# Patient Record
Sex: Male | Born: 1955
Health system: Southern US, Community
[De-identification: ages and names within clinical notes are randomized; demographics above are authoritative.]

## PROBLEM LIST (undated history)

## (undated) DIAGNOSIS — K222 Esophageal obstruction: Secondary | ICD-10-CM

## (undated) DIAGNOSIS — A630 Anogenital (venereal) warts: Secondary | ICD-10-CM

## (undated) DIAGNOSIS — K922 Gastrointestinal hemorrhage, unspecified: Secondary | ICD-10-CM

## (undated) DIAGNOSIS — D649 Anemia, unspecified: Secondary | ICD-10-CM

## (undated) DIAGNOSIS — R945 Abnormal results of liver function studies: Secondary | ICD-10-CM

## (undated) DIAGNOSIS — E785 Hyperlipidemia, unspecified: Secondary | ICD-10-CM

## (undated) DIAGNOSIS — I10 Essential (primary) hypertension: Secondary | ICD-10-CM

## (undated) DIAGNOSIS — T7840XA Allergy, unspecified, initial encounter: Secondary | ICD-10-CM

## (undated) DIAGNOSIS — K219 Gastro-esophageal reflux disease without esophagitis: Secondary | ICD-10-CM

## (undated) HISTORY — DX: Anogenital (venereal) warts: A63.0

## (undated) HISTORY — DX: Gastro-esophageal reflux disease without esophagitis: K21.9

## (undated) HISTORY — DX: Gastrointestinal hemorrhage, unspecified: K92.2

## (undated) HISTORY — DX: Anemia, unspecified: D64.9

## (undated) HISTORY — DX: Allergy, unspecified, initial encounter: T78.40XA

## (undated) HISTORY — PX: GANGLION CYST EXCISION: SHX1691

## (undated) HISTORY — DX: Esophageal obstruction: K22.2

## (undated) HISTORY — DX: Essential (primary) hypertension: I10

## (undated) HISTORY — PX: UPPER GASTROINTESTINAL ENDOSCOPY: SHX188

## (undated) HISTORY — DX: Hyperlipidemia, unspecified: E78.5

## (undated) HISTORY — PX: POLYPECTOMY: SHX149

---

## 2001-07-10 ENCOUNTER — Encounter: Payer: Self-pay | Admitting: General Surgery

## 2001-07-17 ENCOUNTER — Ambulatory Visit (HOSPITAL_COMMUNITY): Admission: RE | Admit: 2001-07-17 | Discharge: 2001-07-17 | Payer: Self-pay | Admitting: General Surgery

## 2001-09-18 ENCOUNTER — Ambulatory Visit (HOSPITAL_COMMUNITY): Admission: RE | Admit: 2001-09-18 | Discharge: 2001-09-18 | Payer: Self-pay | Admitting: General Surgery

## 2001-09-18 ENCOUNTER — Encounter: Payer: Self-pay | Admitting: General Surgery

## 2001-10-30 ENCOUNTER — Ambulatory Visit (HOSPITAL_COMMUNITY): Admission: RE | Admit: 2001-10-30 | Discharge: 2001-10-30 | Payer: Self-pay | Admitting: General Surgery

## 2002-03-05 ENCOUNTER — Other Ambulatory Visit: Admission: RE | Admit: 2002-03-05 | Discharge: 2002-03-05 | Payer: Self-pay | Admitting: Family Medicine

## 2010-07-22 DIAGNOSIS — D649 Anemia, unspecified: Secondary | ICD-10-CM

## 2010-07-22 HISTORY — DX: Anemia, unspecified: D64.9

## 2010-07-24 ENCOUNTER — Other Ambulatory Visit: Payer: Self-pay | Admitting: Obstetrics & Gynecology

## 2010-07-24 ENCOUNTER — Observation Stay (HOSPITAL_COMMUNITY)
Admission: EM | Admit: 2010-07-24 | Discharge: 2010-07-26 | DRG: 174 | Disposition: A | Discharge status: Home / Self Care | Payer: BC Managed Care – PPO | Attending: Internal Medicine | Admitting: Internal Medicine

## 2010-07-24 DIAGNOSIS — E119 Type 2 diabetes mellitus without complications: Secondary | ICD-10-CM | POA: Diagnosis present

## 2010-07-24 DIAGNOSIS — F17200 Nicotine dependence, unspecified, uncomplicated: Secondary | ICD-10-CM | POA: Insufficient documentation

## 2010-07-24 DIAGNOSIS — R0602 Shortness of breath: Secondary | ICD-10-CM | POA: Insufficient documentation

## 2010-07-24 DIAGNOSIS — Z7982 Long term (current) use of aspirin: Secondary | ICD-10-CM | POA: Insufficient documentation

## 2010-07-24 DIAGNOSIS — I10 Essential (primary) hypertension: Secondary | ICD-10-CM | POA: Diagnosis present

## 2010-07-24 DIAGNOSIS — K259 Gastric ulcer, unspecified as acute or chronic, without hemorrhage or perforation: Secondary | ICD-10-CM | POA: Diagnosis present

## 2010-07-24 DIAGNOSIS — K920 Hematemesis: Secondary | ICD-10-CM

## 2010-07-24 DIAGNOSIS — D62 Acute posthemorrhagic anemia: Secondary | ICD-10-CM | POA: Diagnosis present

## 2010-07-24 DIAGNOSIS — A498 Other bacterial infections of unspecified site: Secondary | ICD-10-CM | POA: Diagnosis present

## 2010-07-24 DIAGNOSIS — K254 Chronic or unspecified gastric ulcer with hemorrhage: Principal | ICD-10-CM | POA: Diagnosis present

## 2010-07-24 DIAGNOSIS — N390 Urinary tract infection, site not specified: Secondary | ICD-10-CM | POA: Diagnosis present

## 2010-07-24 DIAGNOSIS — R110 Nausea: Secondary | ICD-10-CM | POA: Insufficient documentation

## 2010-07-24 DIAGNOSIS — Z791 Long term (current) use of non-steroidal anti-inflammatories (NSAID): Secondary | ICD-10-CM | POA: Insufficient documentation

## 2010-07-24 DIAGNOSIS — R55 Syncope and collapse: Secondary | ICD-10-CM | POA: Insufficient documentation

## 2010-07-24 DIAGNOSIS — E785 Hyperlipidemia, unspecified: Secondary | ICD-10-CM | POA: Diagnosis present

## 2010-07-24 DIAGNOSIS — M129 Arthropathy, unspecified: Secondary | ICD-10-CM | POA: Insufficient documentation

## 2010-07-24 DIAGNOSIS — R42 Dizziness and giddiness: Secondary | ICD-10-CM | POA: Insufficient documentation

## 2010-07-24 DIAGNOSIS — K250 Acute gastric ulcer with hemorrhage: Secondary | ICD-10-CM

## 2010-07-24 HISTORY — PX: ESOPHAGOGASTRODUODENOSCOPY: SHX1529

## 2010-07-24 LAB — CBC
HCT: 33.2 % — ABNORMAL LOW (ref 39.0–52.0)
HCT: 29.1 % — ABNORMAL LOW (ref 39.0–52.0)
HCT: 26.7 % — ABNORMAL LOW (ref 39.0–52.0)
Hemoglobin: 11 g/dL — ABNORMAL LOW (ref 13.0–17.0)
Hemoglobin: 8.9 g/dL — ABNORMAL LOW (ref 13.0–17.0)
MCH: 29.2 pg (ref 26.0–34.0)
MCH: 28.8 pg (ref 26.0–34.0)
MCHC: 33.1 g/dL (ref 30.0–36.0)
MCHC: 32.6 g/dL (ref 30.0–36.0)
MCV: 88.1 fL (ref 78.0–100.0)
MCV: 88.2 fL (ref 78.0–100.0)
MCV: 88 fL (ref 78.0–100.0)
MCV: 87.3 fL (ref 78.0–100.0)
Platelets: 327 10*3/uL (ref 150–400)
Platelets: 250 10*3/uL (ref 150–400)
RBC: 3.77 MIL/uL — ABNORMAL LOW (ref 4.22–5.81)
RBC: 3.43 MIL/uL — ABNORMAL LOW (ref 4.22–5.81)
RBC: 3.06 MIL/uL — ABNORMAL LOW (ref 4.22–5.81)
RDW: 13.7 % (ref 11.5–15.5)
RDW: 13.8 % (ref 11.5–15.5)
WBC: 18 10*3/uL — ABNORMAL HIGH (ref 4.0–10.5)
WBC: 12.7 10*3/uL — ABNORMAL HIGH (ref 4.0–10.5)
WBC: 13.3 10*3/uL — ABNORMAL HIGH (ref 4.0–10.5)
WBC: 14.1 10*3/uL — ABNORMAL HIGH (ref 4.0–10.5)

## 2010-07-24 LAB — GLUCOSE, CAPILLARY
Glucose-Capillary: 159 mg/dL — ABNORMAL HIGH (ref 70–99)
Glucose-Capillary: 145 mg/dL — ABNORMAL HIGH (ref 70–99)
Glucose-Capillary: 113 mg/dL — ABNORMAL HIGH (ref 70–99)
Glucose-Capillary: 170 mg/dL — ABNORMAL HIGH (ref 70–99)

## 2010-07-24 LAB — COMPREHENSIVE METABOLIC PANEL
ALT: 17 U/L (ref 0–53)
AST: 17 U/L (ref 0–37)
Albumin: 2.9 g/dL — ABNORMAL LOW (ref 3.5–5.2)
Alkaline Phosphatase: 58 U/L (ref 39–117)
BUN: 30 mg/dL — ABNORMAL HIGH (ref 6–23)
CO2: 23 mEq/L (ref 19–32)
Calcium: 8.4 mg/dL (ref 8.4–10.5)
Chloride: 109 mEq/L (ref 96–112)
Creatinine, Ser: 1 mg/dL (ref 0.4–1.5)
GFR calc Af Amer: >60 mL/min (ref >60)
GFR calc non Af Amer: >60 mL/min (ref >60)
Glucose, Bld: 201 mg/dL — ABNORMAL HIGH (ref 70–99)
Potassium: 4.3 mEq/L (ref 3.5–5.1)
Sodium: 138 mEq/L (ref 135–145)
Total Bilirubin: 0.6 mg/dL (ref 0.3–1.2)
Total Protein: 5.9 g/dL — ABNORMAL LOW (ref 6.0–8.3)

## 2010-07-24 LAB — DIFFERENTIAL
Basophils Absolute: 0.2 10*3/uL — ABNORMAL HIGH (ref 0.0–0.1)
Basophils Relative: 1 % (ref 0–1)
Eosinophils Absolute: 0.3 10*3/uL (ref 0.0–0.7)
Eosinophils Relative: 2 % (ref 0–5)
Lymphocytes Relative: 16 % (ref 12–46)
Lymphs Abs: 2.9 10*3/uL (ref 0.7–4.0)
Monocytes Absolute: 1.1 10*3/uL — ABNORMAL HIGH (ref 0.1–1.0)
Monocytes Relative: 6 % (ref 3–12)
Neutro Abs: 13.6 10*3/uL — ABNORMAL HIGH (ref 1.7–7.7)
Neutrophils Relative %: 75 % (ref 43–77)

## 2010-07-24 LAB — URINALYSIS, ROUTINE W REFLEX MICROSCOPIC
Glucose, UA: NEGATIVE mg/dL
Hgb urine dipstick: NEGATIVE
Ketones, ur: NEGATIVE mg/dL
Protein, ur: NEGATIVE mg/dL

## 2010-07-24 LAB — HEMOCCULT GUIAC POC 1CARD (OFFICE): Fecal Occult Bld: POSITIVE

## 2010-07-24 LAB — LIPASE, BLOOD: Lipase: 47 U/L (ref 11–59)

## 2010-07-24 LAB — ABO/RH: ABO/RH(D): O POS

## 2010-07-25 DIAGNOSIS — K250 Acute gastric ulcer with hemorrhage: Secondary | ICD-10-CM

## 2010-07-25 LAB — COMPREHENSIVE METABOLIC PANEL
AST: 14 U/L (ref 0–37)
Alkaline Phosphatase: 32 U/L — ABNORMAL LOW (ref 39–117)
BUN: 26 mg/dL — ABNORMAL HIGH (ref 6–23)
CO2: 26 mEq/L (ref 19–32)
Chloride: 111 mEq/L (ref 96–112)
Creatinine, Ser: 0.8 mg/dL (ref 0.4–1.5)
GFR calc Af Amer: >60 mL/min (ref >60)
GFR calc non Af Amer: >60 mL/min (ref >60)
Total Bilirubin: 0.4 mg/dL (ref 0.3–1.2)

## 2010-07-25 LAB — GLUCOSE, CAPILLARY
Glucose-Capillary: 121 mg/dL — ABNORMAL HIGH (ref 70–99)
Glucose-Capillary: 99 mg/dL (ref 70–99)

## 2010-07-25 LAB — DIFFERENTIAL
Lymphs Abs: 3.4 10*3/uL (ref 0.7–4.0)
Monocytes Relative: 5 % (ref 3–12)
Neutro Abs: 8.3 10*3/uL — ABNORMAL HIGH (ref 1.7–7.7)
Neutrophils Relative %: 66 % (ref 43–77)

## 2010-07-25 LAB — CBC
Hemoglobin: 9.1 g/dL — ABNORMAL LOW (ref 13.0–17.0)
MCH: 29.4 pg (ref 26.0–34.0)
MCV: 88.7 fL (ref 78.0–100.0)
RBC: 3.1 MIL/uL — ABNORMAL LOW (ref 4.22–5.81)

## 2010-07-25 LAB — HEMOGLOBIN AND HEMATOCRIT, BLOOD: HCT: 25.7 % — ABNORMAL LOW (ref 39.0–52.0)

## 2010-07-26 ENCOUNTER — Telehealth: Payer: Self-pay | Admitting: *Deleted

## 2010-07-26 DIAGNOSIS — K250 Acute gastric ulcer with hemorrhage: Secondary | ICD-10-CM

## 2010-07-26 DIAGNOSIS — D649 Anemia, unspecified: Secondary | ICD-10-CM

## 2010-07-26 DIAGNOSIS — K922 Gastrointestinal hemorrhage, unspecified: Secondary | ICD-10-CM

## 2010-07-26 LAB — URINE CULTURE
Colony Count: >=100000
Culture  Setup Time: 201204040117
Special Requests: NEGATIVE

## 2010-07-26 LAB — DIFFERENTIAL
Eosinophils Absolute: 0.2 10*3/uL (ref 0.0–0.7)
Lymphocytes Relative: 26 % (ref 12–46)
Lymphs Abs: 2.6 10*3/uL (ref 0.7–4.0)
Neutro Abs: 6.6 10*3/uL (ref 1.7–7.7)
Neutrophils Relative %: 65 % (ref 43–77)

## 2010-07-26 LAB — CBC
HCT: 25.4 % — ABNORMAL LOW (ref 39.0–52.0)
Hemoglobin: 8.5 g/dL — ABNORMAL LOW (ref 13.0–17.0)
MCV: 87.6 fL (ref 78.0–100.0)
Platelets: 237 10*3/uL (ref 150–400)
RBC: 2.9 MIL/uL — ABNORMAL LOW (ref 4.22–5.81)
WBC: 10.1 10*3/uL (ref 4.0–10.5)

## 2010-07-26 LAB — COMPREHENSIVE METABOLIC PANEL
Albumin: 3.1 g/dL — ABNORMAL LOW (ref 3.5–5.2)
Alkaline Phosphatase: 38 U/L — ABNORMAL LOW (ref 39–117)
BUN: 14 mg/dL (ref 6–23)
Chloride: 107 mEq/L (ref 96–112)
Potassium: 3.5 mEq/L (ref 3.5–5.1)
Total Bilirubin: 0.7 mg/dL (ref 0.3–1.2)

## 2010-07-26 LAB — GLUCOSE, CAPILLARY: Glucose-Capillary: 101 mg/dL — ABNORMAL HIGH (ref 70–99)

## 2010-07-26 NOTE — Discharge Summary (Signed)
Gerald [name], Gerald [name]            ACCOUNT NO.:  [id number]  MEDICAL RECORD NO.:  [id number]           PATIENT TYPE:  I  LOCATION:  1241                         FACILITY:  [facility]  PHYSICIAN:  Gerald Nap, MD  DATE OF BIRTH:  [birth year]  DATE OF ADMISSION:  07/24/2010 DATE OF DISCHARGE:  07/26/2010                        DISCHARGE SUMMARY - REFERRING   PRIMARY CARE PHYSICIAN:  Gerald John T. Gerald Leyden, MD, from Gerald [name].  DISCHARGE DIAGNOSES: 1. Upper GI bleed, status post esophagogastroduodenoscopy with     epinephrine injection and Endoclip placement. Findings are:     a.     Bleeding gastric ulcer.     b.     Gastroesophageal junction ulcer. 2. Urinary tract infection  (asymptomatic). 3. Anemia. 4. Arthritis. 5. Diabetes mellitus. 6. Hypertension. 7. Hyperlipidemia. 8. History of anal condyloma.  HISTORY:  The patient is a 55 year old Caucasian male very pleasant with history of diabetes mellitus, hypertension and hyperlipidemia and arthritis on chronic ibuprofen use was admitted to the hospital on July 24, 2010 by Dr. Therisa [name] with complaint of hematemesis.  The patient was said to have also complained of lightheadedness.  He denied any history of chest pain or shortness of breath.  He denied any epigastric pain.  He denied any systemic symptoms.  No fever, no chills, no rigor.  The patient also was said to have denied any history of hematochezia or melena.  He was however admitted for stabilization.  MEDICATIONS:  Preadmission meds include: 1. Metformin 500 mg p.o. daily. 2. Aspirin 81 mg p.o. daily. 3. Losartan 50 mg p.o. daily. 4. Simvastatin 20 mg p.o. daily. 5. Ibuprofen 600 mg p.o. daily.  ALLERGIES:  No known allergies.  SOCIAL HISTORY:  Positive for tobacco use, occasionally takes alcohol. Denies any history of street drug use.  FAMILY HISTORY:  Gerald [name] to be significant for diabetes mellitus, brother is diabetic.  REVIEW OF SYSTEMS:   Essentially documented in the initial history and physical.  PHYSICAL EXAMINATION:  VITAL SIGNS:  At time the patient was seen by the admitting physician, vital signs, temperature 97.7, blood pressure is 108/75, pulse is 128, respiratory rate 20, saturating 96% on room air. HEENT:  Pupils are reactive to light and extraocular muscles are intact. NECK:  No jugular venous distention.  No carotid bruit.  No lymphadenopathy. CHEST:  Was said to have been clear to auscultation. HEART:  Sounds are 1 and 2, diminished. ABDOMEN:  Soft, nontender.  Liver, spleen and kidney not palpable. Bowel sounds are positive. EXTREMITIES:  No pedal edema. NEUROLOGIC EXAM:  Nonfocal. MUSCULOSKELETAL SYSTEM:  Showed arthritic changes in the knees. NEUROPSYCHIATRIC EVALUATION:  Unremarkable. SKIN:  Showed normal turgor.  LAB DATA:  Initial complete blood count with differential showed WBC of 18.0, hemoglobin 11.0, hematocrit of 33.2, MCV of 80.1 with platelet count of 327, neutrophil 75%, absolute neutrophil count 13.6 elevated. Comprehensive metabolic panel showed sodium of 138, potassium of 4.3, chloride of 109 with a bicarb of 23, glucose is 201, BUN is 30, creatinine is 1.00.  LFT normal.  Coagulation profile showed PT 15.1, INR 1.17 and APTT of 26, lipase 47.  Fecal occult blood  test positive. A repeat complete blood count with no differential showed WBC of 12.7, hemoglobin 9.5, hematocrit of 29.1, MCV of 88.2 with a platelet count of 261, hemoglobin A1c 6.7.  Urinalysis, nitrite positive and large leukocyte esterase.  Urine microscopy wbc 21-50, few bacteria.  A repeat comprehensive metabolic panel done on July 26, 2010 showed sodium of 138, potassium of 3.5, chloride of 170, bicarb of 26, glucose is 99, BUN is 14, creatinine is 0.77.  LFTs showed total bilirubin 0.7, alkaline phosphatase 38, AST 18, ALT 15 and complete blood count with differential showed WBC of 10.1, hemoglobin of 8.5, hematocrit of  25.4, MCV 87.6 with a platelet count of 233.  Urine culture grew Escherichia coli, sensitive to ciprofloxacin, ceftriaxone, cefazolin, ampicillin, gentamicin, levofloxacin, nitrofurantoin, Tobramycin and Bactrim.  HOSPITAL COURSE:  The patient was admitted to step-down unit.  He was started on normal saline with 10 mEq of KCl to go at rate of 150 mL an hour and was also started on IV Protonix continuous infusion 80 mg stat and subsequently 80 mg per hour continuous infusion.  The patient was also given albuterol and Atrovent nebs q. 6 p.r.n. for shortness of breath.  He was placed on Accu-Cheks with regular insulin sliding scale. The patient was also given morphine 2 mg IV q. 4 p.r.n. and Chloraseptic mouth wash.  GI physician was consulted.  The patient was evaluated by Gerald [name].  Gerald [name] and subsequently had upper endoscopy done with epinephrine injection and Endoclip and the findings are described above. Post EGD, the patient was continued on IV Protonix infusion.  He was seen by me for the very first time in this admission on July 25, 2010 and during this encounter, the patient denied any hematochezia or melena.  Examination of the patient was essentially unremarkable. During this encounter, IV Protonix was discontinued and the patient was to continue on p.o. Protonix.  He was, however, kept for 1 additional day in order to prevent ulcer replete.  The patient was reevaluated by me again today which is July 26, 2010, denied any hematemesis, no diarrhea, no hematochezia.  Examination was essentially unremarkable. Vital Signs, blood pressure is 113/76, pulse 72, respiratory rate 24, medically stable.  PLAN:  Plan is for the patient to be discharged home today on activity as tolerated.  He was advised to discontinue aspirin and nonsteroidal anti-inflammatory for now and to use Tylenol for his arthritis.  He is to continue on diabetic ADA diet and he will be followed by his  primary care physician in 1-2 weeks and also by GI Gerald [name], Gerald [name] on Aug 22, 2010 at 3:15 p.m., telephone number is [phone].  Medication to be taken at home will include: 1. Cipro 500 mg 1 p.o. b.i.d. for 7 days for treatment of UTI. 2. Pantoprazole 40 mg p.o. daily with meals. 3. Fish oil over-the-counter 1 capsule p.o. daily. 4. Glucosamine HCl 2 tablets p.o. daily. 5. Losartan 50 mg 1 p.o. daily. 6. Metformin 500 mg 1 p.o. daily. 7. Multivitamin 1 p.o. daily. 8. Simvastatin 20 mg p.o. daily at bedtime.     Gerald Nap, MD     CN/MEDQ  D:  07/26/2010  T:  07/26/2010  Job:  [id number]  Electronically Signed by Gerald [name]  on 07/26/2010 04:18:53 PM

## 2010-07-26 NOTE — Telephone Encounter (Signed)
Mike Gip, PA wants patient to have CBC on 07/30/10 . Orders in EPIC. She will  notify patient

## 2010-07-30 ENCOUNTER — Other Ambulatory Visit (INDEPENDENT_AMBULATORY_CARE_PROVIDER_SITE_OTHER): Payer: BC Managed Care – PPO

## 2010-07-30 DIAGNOSIS — D649 Anemia, unspecified: Secondary | ICD-10-CM

## 2010-07-30 DIAGNOSIS — K922 Gastrointestinal hemorrhage, unspecified: Secondary | ICD-10-CM

## 2010-07-30 LAB — CBC WITH DIFFERENTIAL/PLATELET
Basophils Relative: 0.7 % (ref 0.0–3.0)
Eosinophils Absolute: 0.2 10*3/uL (ref 0.0–0.7)
Hemoglobin: 8.8 g/dL — ABNORMAL LOW (ref 13.0–17.0)
MCHC: 34.7 g/dL (ref 30.0–36.0)
MCV: 89.1 fl (ref 78.0–100.0)
Monocytes Absolute: 0.7 10*3/uL (ref 0.1–1.0)
Neutro Abs: 6.9 10*3/uL (ref 1.4–7.7)
Neutrophils Relative %: 66.2 % (ref 43.0–77.0)
RBC: 2.86 Mil/uL — ABNORMAL LOW (ref 4.22–5.81)

## 2010-08-01 NOTE — H&P (Signed)
NAMEPACEY, ALTIZER            ACCOUNT NO.:  [id number]  MEDICAL RECORD NO.:  [id number]           PATIENT TYPE:  E  LOCATION:  WLED                         FACILITY:  [facility]  PHYSICIAN:  Michiel Cowboy, MDDATE OF BIRTH:  [birth year]  DATE OF ADMISSION:  07/24/2010 DATE OF DISCHARGE:                             HISTORY & PHYSICAL   PRIMARY CARE PROVIDER:  Broadus John T. Pamalee Leyden, M.D., from [facility].  CHIEF COMPLAINT:  Vomiting blood.  HISTORY OF PRESENT ILLNESS:  The patient is a 55 year old gentleman with past history of hypertension, diabetes, hyperlipidemia, and arthritis for which he is taking ibuprofen chronically.  The patient reports today he developed nausea.  When he vomited, it was bloody emesis.  He vomited the amount equal to about 1 inch from the bottom of small trash can. This was earlier today.  Since then, he has not had any repeat vomiting. He has not had any black stools or dark stools.  He had a normal stool yesterday.  He was lightheaded at first but currently feels a little better, no more nausea, no epigastric pain.  SOCIAL HISTORY:  The patient smokes but drinks on occasion.  Does not abuse drugs.  PAST MEDICAL HISTORY:  Significant for: 1. Diabetes. 2. Hypercholesteremia. 3. Hypertension. 4. Chronic arthritis. 5. History of anal condyloma.  FAMILY HISTORY:  Significant for brother with diabetes.  REVIEW OF SYSTEMS:  Negative except for HPI.  No chest pain.  No shortness of breath.  No diarrhea.  The rest of review of systems negative.  ALLERGIES:  None.  MEDICATIONS: 1. Metformin 500 mg daily. 2. Aspirin 81 mg daily. 3. Losartan 50 mg daily. 4. Simvastatin 20 mg daily. 5. Ibuprofen 600 mg daily.  PHYSICAL EXAMINATION:  VITAL SIGNS:  Temperature 97.7, blood pressure 118/75, pulse 128, respirations 20, and satting 96% on room air. GENERAL:  The patient appears to be in no acute distress. HEENT:  Head nontraumatic but pale.   Somewhat dryish mucous membranes but normal skin turgor. LUNGS:  Occasional wheezes throughout.  No crackles. HEART:  Regular rhythm.  No murmurs appreciated. ABDOMEN:  Soft, nontender, and nondistended.  No epigastric tenderness. LOWER EXTREMITIES:  Without clubbing, cyanosis, or edema. NEUROLOGIC:  Grossly intact. RECTAL:  The patient have had a rectal exam done by me.  It showed some brown stool in the vault.  No maroon stool.  No blood in the rectal vault.  Hemoccult is currently pending.  LABORATORY DATA:  White blood cell count 18, hemoglobin 11.  Sodium 138, potassium 4.3, creatinine 1.0, albumin 2.9.  LFTs within normal limits, lipase 47.  INR within normal limits.  ASSESSMENT AND PLAN:  This is a 55 year old gentleman who had developed nausea, vomiting, and hematemesis, likely upper gastrointestinal source bleed. 1. Hematemesis.  Given elevated blood urea nitrogen of 30 and     creatinine 1.0, we will admit for further evaluation.  We will     obtain type and screen.  Watch in step-down.  Frequent complete    blood count.  We will transfuse if hemoglobin starts to fall     rapidly or go below 8.  Would follow blood urea nitrogen.  Obtain     gastrointestinal consult in a.m. or sooner if he decompensates.  We     will give aggressive intravenous fluids and start Protonix drip. 2. Leukocytosis, etiology unclear.  We will watch, perhaps the patient     is somewhat hemoconcentrated versus this is reactive.  Currently,     no evidence of infection. 3. History of hypertension.  Hold his losartan for now. 4. Prophylaxis.  Protonix drip and sequential compression devices.     Michiel Cowboy, MD     AVD/MEDQ  D:  07/24/2010  T:  07/24/2010  Job:  [id number]  cc:   Broadus John T. Pamalee Leyden, MD  Electronically Signed by Therisa Doyne MD on 08/01/2010 08:42:09 PM

## 2010-08-06 ENCOUNTER — Telehealth: Payer: Self-pay | Admitting: *Deleted

## 2010-08-06 NOTE — Telephone Encounter (Signed)
Left message for patient to call me back. 

## 2010-08-06 NOTE — Telephone Encounter (Signed)
Message copied by Jesse Fall on Mon Aug 06, 2010  2:42 PM ------      Message from: [place], [place]      Created: Mon Aug 06, 2010  1:42 PM       Please let micheal know his hgb is stable at 8.8,but low. See how he is feeling

## 2010-08-07 ENCOUNTER — Telehealth: Payer: Self-pay | Admitting: *Deleted

## 2010-08-07 NOTE — Telephone Encounter (Signed)
Spoke with patient's wife and gave her lab results. She states he is feeling fine

## 2010-08-14 ENCOUNTER — Telehealth: Payer: Self-pay | Admitting: Gastroenterology

## 2010-08-14 NOTE — Telephone Encounter (Signed)
Gave pt the appt information for the EGD on May 29th.

## 2010-08-21 HISTORY — PX: ESOPHAGOGASTRODUODENOSCOPY: SHX1529

## 2010-08-22 ENCOUNTER — Ambulatory Visit: Payer: 59 | Admitting: Gastroenterology

## 2010-09-13 ENCOUNTER — Encounter: Payer: Self-pay | Admitting: Gastroenterology

## 2010-09-13 ENCOUNTER — Ambulatory Visit (INDEPENDENT_AMBULATORY_CARE_PROVIDER_SITE_OTHER): Payer: BC Managed Care – PPO | Admitting: Gastroenterology

## 2010-09-13 DIAGNOSIS — K254 Chronic or unspecified gastric ulcer with hemorrhage: Secondary | ICD-10-CM | POA: Insufficient documentation

## 2010-09-13 DIAGNOSIS — Z1211 Encounter for screening for malignant neoplasm of colon: Secondary | ICD-10-CM | POA: Insufficient documentation

## 2010-09-13 MED ORDER — PEG-KCL-NACL-NASULF-NA ASC-C 100 G PO SOLR: 1.0000 | ORAL | Status: DC

## 2010-09-13 NOTE — Patient Instructions (Signed)
You have been scheduled for a Endo and Colon written instructions have been given. Your prescription has been sent to your pharmacy.  Colonoscopy A colonoscopy is an exam to evaluate your entire colon. In this exam, your colon is cleansed. A long fiberoptic tube is inserted through your rectum and into your colon. The fiberoptic scope (endoscope) is a long bundle of enclosed and very flexible fibers. These fibers transmit light to the area examined and send images from that area to your caregiver. Discomfort is usually minimal. You may be given a drug to help you sleep (sedative) during or prior to the procedure. This exam helps to detect lumps (tumors), polyps, inflammation, and areas of bleeding. Your caregiver may also take a small piece of tissue (biopsy) that will be examined under a microscope. BEFORE THE PROCEDURE  A clear liquid diet may be required for 2 days before the exam.   Liquid injections (enemas) or laxatives may be required.   A large amount of electrolyte solution may be given to you to drink over a short period of time. This solution is used to clean out your colon.   Check in at the admissions desk to fill out necessary forms if not preregistered. There will be consent forms to sign prior to the procedure. If accompanied by friends or family, there is a waiting area for them while you are having your procedure.  LET YOUR CAREGIVER KNOW ABOUT:  Allergies to food or medicine.  Medicines taken, including vitamins, herbs, eyedrops, over-the-counter medicines, and creams.   Use of steroids (by mouth or creams).   Previous problems with anesthetics or numbing medicines.   History of bleeding problems or blood clots.  Previous surgery.   Other health problems, including diabetes and kidney problems.   Possibility of pregnancy, if this applies.   AFTER THE PROCEDURE  If you received a sedative and/or pain medicine, you will need to arrange for someone to drive you home.     Occasionally, there is a little blood passed with the first bowel movement. DO NOT be concerned.  HOME CARE INSTRUCTIONS  It is not unusual to pass moderate amounts of gas and experience mild abdominal cramping following the procedure. This is due to air being used to inflate your colon during the exam. Walking or a warm pack on your belly (abdomen) may help.   You may resume all normal meals and activities after sedatives and medicines have worn off.   Only take over-the-counter or prescription medicines for pain, discomfort, or fever as directed by your caregiver. DO NOT use aspirin or blood thinners if a biopsy was taken. Consult your caregiver for medicine usage if biopsies were taken.  FINDING OUT THE RESULTS OF YOUR TEST Not all test results are available during your visit. If your test results are not back during the visit, make an appointment with your caregiver to find out the results. Do not assume everything is normal if you have not heard from your caregiver or the medical facility. It is important for you to follow up on all of your test results. SEEK IMMEDIATE MEDICAL CARE IF:    You pass large blood clots or fill a toilet with blood following the procedure. This may also occur 10 to 14 days following the procedure. This is more likely if a biopsy was taken.   You develop abdominal pain that keeps getting worse and cannot be relieved with medicine.  Document Released: 04/05/2000 Document Re-Released: 07/03/2009 [employer] Patient Information 2011  [employer].Upper GI Endoscopy Upper GI endoscopy means using a flexible scope to look at the esophagus, stomach and upper small bowel. This is done to make a diagnosis in people with heartburn, abdominal pain, or abnormal bleeding. Sometimes an endoscope is needed to remove foreign bodies or food that become stuck in the esophagus; it can also be used to take biopsy samples. For the best results, do not eat or drink for 8 hours before  having your upper endoscopy.  To perform the endoscopy, you will probably be sedated and your throat will be numbed with a special spray. The endoscope is then slowly passed down your throat (this will not interfere with your breathing). An endoscopy exam takes 15-30 minutes to complete and there is no real pain. Patients rarely remember much about the procedure. The results of the test may take several days if a biopsy or other test is taken.  You may have a sore throat after an endoscopy exam. Serious complications are very rare. Stick to liquids and soft foods until your pain is better. You should not drive a car or operate any dangerous equipment for at least 24 hours after being sedated. SEEK IMMEDIATE MEDICAL CARE IF:  You have severe throat pain.   You have shortness of breath.   You have bleeding problems.   You have a fever.   You have difficulty recovering from your sedation.  Document Released: 05/16/2004 Document Re-Released: 07/03/2009 [facility] Patient Information 2011 [place], [place].

## 2010-09-13 NOTE — Assessment & Plan Note (Signed)
Plan followup endoscopy to document ulcer healing. Patient was carefully instructed to avoid NSAIDs.

## 2010-09-13 NOTE — Progress Notes (Signed)
History of Present Illness:  Gerald [name] has returned following hospitalization in early April for an NSAID-related gastric ulcer with bleeding treated with epinephrine and an Endo Clip. Since discharge he's had no further GI problems. He remains on Protonix. He has quit smoking.    Review of Systems: Pertinent positive and negative review of systems were noted in the above HPI section. All other review of systems were otherwise negative.    Current Medications, Allergies, Past Medical History, Past Surgical History, Family History and Social History were reviewed in [employer] electronic medical record  Vital signs were reviewed in today's medical record. Physical Exam: General: Well developed , well nourished, no acute distress

## 2010-09-18 ENCOUNTER — Telehealth: Payer: Self-pay

## 2010-09-18 ENCOUNTER — Ambulatory Visit (HOSPITAL_COMMUNITY)
Admission: RE | Admit: 2010-09-18 | Discharge: 2010-09-18 | Disposition: A | Discharge status: Home / Self Care | Payer: BC Managed Care – PPO | Source: Ambulatory Visit | Attending: Gastroenterology | Admitting: Gastroenterology

## 2010-09-18 ENCOUNTER — Encounter: Payer: BC Managed Care – PPO | Admitting: Gastroenterology

## 2010-09-18 DIAGNOSIS — Z79899 Other long term (current) drug therapy: Secondary | ICD-10-CM | POA: Insufficient documentation

## 2010-09-18 DIAGNOSIS — Z09 Encounter for follow-up examination after completed treatment for conditions other than malignant neoplasm: Secondary | ICD-10-CM

## 2010-09-18 DIAGNOSIS — Z01810 Encounter for preprocedural cardiovascular examination: Secondary | ICD-10-CM | POA: Insufficient documentation

## 2010-09-18 DIAGNOSIS — Z01812 Encounter for preprocedural laboratory examination: Secondary | ICD-10-CM | POA: Insufficient documentation

## 2010-09-18 DIAGNOSIS — I10 Essential (primary) hypertension: Secondary | ICD-10-CM | POA: Insufficient documentation

## 2010-09-18 DIAGNOSIS — D649 Anemia, unspecified: Secondary | ICD-10-CM | POA: Insufficient documentation

## 2010-09-18 DIAGNOSIS — K250 Acute gastric ulcer with hemorrhage: Secondary | ICD-10-CM

## 2010-09-18 DIAGNOSIS — Z0389 Encounter for observation for other suspected diseases and conditions ruled out: Secondary | ICD-10-CM | POA: Insufficient documentation

## 2010-09-18 DIAGNOSIS — E119 Type 2 diabetes mellitus without complications: Secondary | ICD-10-CM | POA: Insufficient documentation

## 2010-09-18 LAB — CBC
MCH: 25.9 pg — ABNORMAL LOW (ref 26.0–34.0)
MCHC: 32.8 g/dL (ref 30.0–36.0)
Platelets: 294 10*3/uL (ref 150–400)

## 2010-09-18 NOTE — Telephone Encounter (Signed)
Appt made for pt to have colon with propofol 10/16/10@11 :30am. Pt aware of appt date and time.

## 2010-09-18 NOTE — Telephone Encounter (Signed)
Pt should have been scheduled for colon with propofol. Appt made for pt to have colon with

## 2010-09-19 ENCOUNTER — Other Ambulatory Visit: Payer: 59 | Admitting: Gastroenterology

## 2010-09-21 ENCOUNTER — Telehealth: Payer: Self-pay | Admitting: *Deleted

## 2010-09-21 MED ORDER — FERROUS SULFATE DRIED 200 (65 FE) MG PO TABS: 200.0000 mg | ORAL_TABLET | ORAL | Status: DC

## 2010-09-21 NOTE — Telephone Encounter (Signed)
Message copied by Marlowe Kays on Fri Sep 21, 2010  1:16 PM ------      Message from: Melvia Heaps MD D      Created: Thu Sep 20, 2010  8:57 AM       Pt should begin fesol 200mg  qd for 1 month

## 2010-10-16 ENCOUNTER — Encounter: Payer: BC Managed Care – PPO | Admitting: Gastroenterology

## 2010-10-18 ENCOUNTER — Other Ambulatory Visit: Payer: BC Managed Care – PPO | Admitting: Gastroenterology

## 2010-10-21 HISTORY — PX: COLONOSCOPY: SHX174

## 2010-11-02 ENCOUNTER — Ambulatory Visit (AMBULATORY_SURGERY_CENTER): Payer: BC Managed Care – PPO | Admitting: Gastroenterology

## 2010-11-02 ENCOUNTER — Encounter: Payer: Self-pay | Admitting: Gastroenterology

## 2010-11-02 VITALS — BP 148/91 | HR 77 | Temp 98.2°F | Resp 18 | Ht 72.0 in | Wt 222.0 lb

## 2010-11-02 DIAGNOSIS — K635 Polyp of colon: Secondary | ICD-10-CM

## 2010-11-02 DIAGNOSIS — D126 Benign neoplasm of colon, unspecified: Secondary | ICD-10-CM

## 2010-11-02 DIAGNOSIS — Z1211 Encounter for screening for malignant neoplasm of colon: Secondary | ICD-10-CM

## 2010-11-02 LAB — GLUCOSE, CAPILLARY: Glucose-Capillary: 112 mg/dL — ABNORMAL HIGH (ref 70–99)

## 2010-11-02 MED ORDER — SODIUM CHLORIDE 0.9 % IV SOLN: 500.0000 mL | INTRAVENOUS | Status: DC

## 2010-11-02 NOTE — Patient Instructions (Signed)
1 polyp  Otherwise normal  See green and blue sheets for additional d/c instructions.

## 2010-11-05 ENCOUNTER — Telehealth: Payer: Self-pay | Admitting: *Deleted

## 2010-11-05 NOTE — Telephone Encounter (Signed)

## 2012-07-20 ENCOUNTER — Telehealth: Payer: Self-pay | Admitting: Family Medicine

## 2012-07-20 MED ORDER — PRAVASTATIN SODIUM 20 MG PO TABS: 20.0000 mg | ORAL_TABLET | Freq: Every day | ORAL | Status: DC

## 2012-07-20 MED ORDER — LOVASTATIN 20 MG PO TABS: 20.0000 mg | ORAL_TABLET | Freq: Every day | ORAL | Status: DC

## 2012-07-20 NOTE — Telephone Encounter (Signed)
D/c simvastatin and start lovastatin 20 qday.

## 2012-07-20 NOTE — Telephone Encounter (Signed)
Ok to change

## 2012-09-21 ENCOUNTER — Other Ambulatory Visit: Payer: Self-pay | Admitting: Family Medicine

## 2012-12-01 ENCOUNTER — Other Ambulatory Visit: Payer: Self-pay | Admitting: Family Medicine

## 2012-12-01 ENCOUNTER — Encounter: Payer: Self-pay | Admitting: Family Medicine

## 2012-12-01 NOTE — Telephone Encounter (Signed)
Medication refill for one time only.  Patient needs to be seen.  Letter sent for patient to call and schedule 

## 2012-12-21 DIAGNOSIS — R7989 Other specified abnormal findings of blood chemistry: Secondary | ICD-10-CM

## 2012-12-21 DIAGNOSIS — R945 Abnormal results of liver function studies: Secondary | ICD-10-CM

## 2012-12-21 HISTORY — DX: Abnormal results of liver function studies: R94.5

## 2012-12-21 HISTORY — DX: Other specified abnormal findings of blood chemistry: R79.89

## 2012-12-28 ENCOUNTER — Other Ambulatory Visit: Payer: Self-pay | Admitting: Family Medicine

## 2013-01-01 ENCOUNTER — Ambulatory Visit (INDEPENDENT_AMBULATORY_CARE_PROVIDER_SITE_OTHER): Payer: Self-pay | Admitting: Family Medicine

## 2013-01-01 ENCOUNTER — Encounter: Payer: Self-pay | Admitting: Family Medicine

## 2013-01-01 VITALS — BP 164/82 | HR 94 | Temp 98.8°F | Resp 18 | Wt 232.0 lb

## 2013-01-01 DIAGNOSIS — I10 Essential (primary) hypertension: Secondary | ICD-10-CM

## 2013-01-01 MED ORDER — HYDROCHLOROTHIAZIDE 25 MG PO TABS: 25.0000 mg | ORAL_TABLET | Freq: Every day | ORAL | Status: DC

## 2013-01-01 MED ORDER — LOSARTAN POTASSIUM 50 MG PO TABS: 50.0000 mg | ORAL_TABLET | Freq: Every day | ORAL | Status: DC

## 2013-01-01 NOTE — Progress Notes (Signed)
Subjective:    Patient ID: Gerald [name], male    DOB: 1956-01-12, 58 y.o.   MRN: [id number]  HPI Patient has a history of a bleeding gastric ulcer 3 years ago.  Ever since that time he has been fine with no complications, no stomach pain, no reflux. He is avoiding all NSAIDs including aspirin. Occasionally he has a history of hyperlipidemia, hypertension, and diabetes mellitus type 2. At present his blood pressures controlled at 140-160 over 80s. We have previously avoided aspirin therapy due to a concern of GI bleed.  The patient denies any chest pain shortness of breath or dyspnea on exertion. He continues to refrain from smoking for 3 years now. He denies any myalgia or right upper quadrant pain on his pravastatin. He is not checking his sugars at all unfortunately. He is currently on metformin. He denies any polyuria polydipsia or blurred vision. Unfortunately he lost his insurance with the recent health care law.  Therefore he has not seen an eye doctor or had any lab work in over a year. Past Medical History  Diagnosis Date  . Upper GI bleed   . Gastric ulcer   . Anemia   . Arthritis   . Diabetes mellitus   . Unspecified essential hypertension   . Hyperlipemia   . Anal condyloma    Current Outpatient Prescriptions on File Prior to Visit  Medication Sig Dispense Refill  . Glucosamine-Chondroitin-MSM (TRIPLE FLEX PO) Take by mouth.        . metFORMIN (GLUCOPHAGE) 1000 MG tablet TAKE 1 TABLET BY MOUTH TWICE A DAY  180 tablet  3  . Multiple Vitamin (MULTIVITAMIN PO) Take by mouth.        . pravastatin (PRAVACHOL) 20 MG tablet Take 1 tablet (20 mg total) by mouth daily.  90 tablet  3   Current Facility-Administered Medications on File Prior to Visit  Medication Dose Route Frequency Provider Last Rate Last Dose  . 0.9 %  sodium chloride infusion  500 mL Intravenous Continuous Louis Meckel, MD       No Known Allergies History   Social History  . Marital Status: Married   Spouse Name: N/A    Number of Children: N/A  . 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 Smoking status: Former Smoker    Quit date: 07/24/2010  . Smokeless tobacco: Never Used  . Alcohol Use: No  . Drug Use: No  . Sexual Activity: Not on file   Other Topics Concern  . Not on file   Social History Narrative  . No narrative on file      Review of Systems  All other systems reviewed and are negative.       Objective:   Physical Exam  Vitals reviewed. Constitutional: He appears well-developed and well-nourished.  Cardiovascular: Normal rate, regular rhythm, normal heart sounds and intact distal pulses.  Exam reveals no gallop and no friction rub.   No murmur heard. Pulmonary/Chest: Effort normal and breath sounds normal. No respiratory distress. He has no wheezes. He has no rales. He exhibits no tenderness.  Abdominal: Soft. Bowel sounds are normal. He exhibits no distension and no mass. There is no tenderness. There is no rebound and no guarding.          Assessment & Plan:  1. HTN (hypertension) Blood pressure is poorly controlled. Add hydrochlorothiazide 25 mg by mouth daily 2 losartan. Recheck blood  pressure in one month. - hydrochlorothiazide (HYDRODIURIL) 25 MG tablet; Take 1 tablet (25 mg total) by mouth daily.  Dispense: 30 tablet; Refill: 11  2. Type II or unspecified type diabetes mellitus without mention of complication, uncontrolled Patient defers a fasting lipid panel for now due to cost. He consents obtaining a CMP and hemoglobin A1c. His goal hemoglobin A1c less than 6.5. Due to his multiple risk factors I did recommend an aspirin 81 mg by mouth daily. I advised the patient to monitor for any signs of GI upset closely. If he develops any heartburn or indigestion no recommend stopping aspirin immediately. However given his elevated blood pressure, and diabetes, and his hyperlipidemia, I feel the  benefits of an aspirin in preventing cardiovascular disease outweigh the risk of recurrent GI bleed. - COMPLETE METABOLIC PANEL WITH GFR - Hemoglobin A1c

## 2013-01-02 LAB — COMPLETE METABOLIC PANEL WITH GFR
Albumin: 4.7 g/dL (ref 3.5–5.2)
BUN: 18 mg/dL (ref 6–23)
CO2: 24 mEq/L (ref 19–32)
GFR, Est African American: >89 mL/min
GFR, Est Non African American: >89 mL/min
Glucose, Bld: 196 mg/dL — ABNORMAL HIGH (ref 70–99)
Sodium: 138 mEq/L (ref 135–145)
Total Bilirubin: 0.4 mg/dL (ref 0.3–1.2)
Total Protein: 7.3 g/dL (ref 6.0–8.3)

## 2013-01-02 LAB — HEMOGLOBIN A1C: Hgb A1c MFr Bld: 8.7 % — ABNORMAL HIGH (ref <5.7)

## 2013-01-04 ENCOUNTER — Telehealth: Payer: Self-pay | Admitting: Family Medicine

## 2013-01-04 NOTE — Telephone Encounter (Signed)
Pt is on Losartan 100 mg not 50 mg.

## 2013-01-05 NOTE — Telephone Encounter (Signed)
Please update med list and recheck BP in one month on HCTZ.

## 2013-01-06 ENCOUNTER — Encounter: Payer: Self-pay | Admitting: Family Medicine

## 2013-01-11 ENCOUNTER — Other Ambulatory Visit: Payer: Self-pay | Admitting: Family Medicine

## 2013-01-11 MED ORDER — GLIPIZIDE ER 10 MG PO TB24: 10.0000 mg | ORAL_TABLET | Freq: Every day | ORAL | Status: DC

## 2013-01-11 NOTE — Telephone Encounter (Signed)
Med sent to pharm 

## 2013-02-25 ENCOUNTER — Other Ambulatory Visit: Payer: Self-pay | Admitting: Family Medicine

## 2013-04-20 ENCOUNTER — Other Ambulatory Visit: Payer: Self-pay | Admitting: Family Medicine

## 2013-04-20 NOTE — Telephone Encounter (Signed)
Medication refilled per protocol. 

## 2013-05-08 ENCOUNTER — Other Ambulatory Visit: Payer: Self-pay | Admitting: Family Medicine

## 2013-07-09 ENCOUNTER — Other Ambulatory Visit: Payer: Self-pay | Admitting: Family Medicine

## 2013-09-02 ENCOUNTER — Other Ambulatory Visit: Payer: Self-pay | Admitting: Family Medicine

## 2013-09-10 ENCOUNTER — Encounter: Payer: Self-pay | Admitting: Family Medicine

## 2013-09-10 ENCOUNTER — Other Ambulatory Visit: Payer: Self-pay | Admitting: Family Medicine

## 2013-09-10 NOTE — Telephone Encounter (Signed)
Medication refill for one time only.  Patient needs to be seen.  Letter sent for patient to call and schedule 

## 2013-09-10 NOTE — Telephone Encounter (Signed)
Refill appropriate and filled per protocol. 

## 2013-09-14 ENCOUNTER — Other Ambulatory Visit: Payer: Self-pay | Admitting: Family Medicine

## 2013-09-14 MED ORDER — LOSARTAN POTASSIUM 100 MG PO TABS: ORAL_TABLET | ORAL | Status: DC

## 2013-09-14 NOTE — Telephone Encounter (Signed)
Rx Refilled  

## 2013-10-12 ENCOUNTER — Other Ambulatory Visit: Payer: Self-pay | Admitting: Family Medicine

## 2013-11-07 ENCOUNTER — Other Ambulatory Visit: Payer: Self-pay | Admitting: Family Medicine

## 2013-11-09 ENCOUNTER — Other Ambulatory Visit: Payer: Self-pay | Admitting: Family Medicine

## 2013-11-09 ENCOUNTER — Encounter: Payer: Self-pay | Admitting: Family Medicine

## 2013-11-09 NOTE — Telephone Encounter (Signed)
Medication refill for one time only.  Patient needs to be seen.  Letter sent for patient to call and schedule 

## 2013-12-03 ENCOUNTER — Encounter: Payer: Self-pay | Admitting: Family Medicine

## 2013-12-03 ENCOUNTER — Ambulatory Visit (INDEPENDENT_AMBULATORY_CARE_PROVIDER_SITE_OTHER): Payer: Self-pay | Admitting: Family Medicine

## 2013-12-03 VITALS — BP 140/80 | HR 84 | Temp 98.1°F | Resp 18 | Ht 72.0 in | Wt 236.0 lb

## 2013-12-03 DIAGNOSIS — E1165 Type 2 diabetes mellitus with hyperglycemia: Principal | ICD-10-CM

## 2013-12-03 DIAGNOSIS — IMO0001 Reserved for inherently not codable concepts without codable children: Secondary | ICD-10-CM

## 2013-12-03 LAB — COMPLETE METABOLIC PANEL WITH GFR
ALBUMIN: 4.5 g/dL (ref 3.5–5.2)
ALT: 52 U/L (ref 0–53)
AST: 56 U/L — AB (ref 0–37)
Alkaline Phosphatase: 45 U/L (ref 39–117)
BUN: 15 mg/dL (ref 6–23)
CO2: 25 meq/L (ref 19–32)
Calcium: 10.1 mg/dL (ref 8.4–10.5)
Chloride: 100 mEq/L (ref 96–112)
Creat: 0.92 mg/dL (ref 0.50–1.35)
GFR, Est African American: >89 mL/min
GLUCOSE: 176 mg/dL — AB (ref 70–99)
POTASSIUM: 4.1 meq/L (ref 3.5–5.3)
Sodium: 137 mEq/L (ref 135–145)
Total Bilirubin: 0.5 mg/dL (ref 0.2–1.2)
Total Protein: 7.7 g/dL (ref 6.0–8.3)

## 2013-12-03 LAB — HEMOGLOBIN A1C
Hgb A1c MFr Bld: 7.7 % — ABNORMAL HIGH (ref <5.7)
MEAN PLASMA GLUCOSE: 174 mg/dL — AB (ref <117)

## 2013-12-03 LAB — MICROALBUMIN, URINE: MICROALB UR: 1.28 mg/dL (ref 0.00–1.89)

## 2013-12-03 NOTE — Progress Notes (Signed)
   Subjective:    Patient ID: Gerald [name], male    DOB: [birth year], 58 y.o.   MRN: [id number]  HPI Patient history of hypertension, hyperlipidemia, and diabetes mellitus type 2. He is overdue for fasting lipid panel, CMP, and hemoglobin A1c. His blood pressure today is borderline at 140/80. He denies any chest pain short of breath or dyspnea on exertion. His blood pressure is typically 120/80. He is not checking his blood sugars. He denies any myalgias or right upper quadrant pain on statins. Past Medical History  Diagnosis Date  . Upper GI bleed   . Gastric ulcer   . Anemia   . Arthritis   . Diabetes mellitus   . Unspecified essential hypertension   . Hyperlipemia   . Anal condyloma    Past Surgical History  Procedure Laterality Date  . Ganglion cyst excision     Current Outpatient Prescriptions on File Prior to Visit  Medication Sig Dispense Refill  . glipiZIDE (GLUCOTROL XL) 10 MG 24 hr tablet TAKE 1 TABLET EVERY DAY  30 tablet  0  . Glucosamine-Chondroitin-MSM (TRIPLE FLEX PO) Take by mouth.        . hydrochlorothiazide (HYDRODIURIL) 25 MG tablet Take 1 tablet (25 mg total) by mouth daily.  30 tablet  11  . losartan (COZAAR) 100 MG tablet TAKE 1 TABLET BY MOUTH DAILY  30 tablet  0  . metFORMIN (GLUCOPHAGE) 1000 MG tablet TAKE 1 TABLET BY MOUTH TWICE A DAY  180 tablet  3  . Multiple Vitamin (MULTIVITAMIN PO) Take by mouth.        . pravastatin (PRAVACHOL) 20 MG tablet TAKE 1 TABLET EVERY DAY  30 tablet  0   Current Facility-Administered Medications on File Prior to Visit  Medication Dose Route Frequency Provider Last Rate Last Dose  . 0.9 %  sodium chloride infusion  500 mL Intravenous Continuous Inda Castle, MD       No Known Allergies History   Social History  . Marital Status: Married    Spouse Name: N/A    Number of Children: N/A  . 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 Smoking status:  Former Smoker    Quit date: 07/24/2010  . Smokeless tobacco: Never Used  . Alcohol Use: No  . Drug Use: No  . Sexual Activity: Not on file   Other Topics Concern  . Not on file   Social History Narrative  . No narrative on file      Review of Systems  All other systems reviewed and are negative.      Objective:   Physical Exam  Vitals reviewed. Cardiovascular: Normal rate, regular rhythm and normal heart sounds.   Pulmonary/Chest: Effort normal and breath sounds normal. No respiratory distress. He has no wheezes. He has no rales.  Abdominal: Soft. Bowel sounds are normal. He exhibits no distension. There is no tenderness. There is no rebound and no guarding.  Musculoskeletal: He exhibits no edema.          Assessment & Plan:  1. Type II or unspecified type diabetes mellitus without mention of complication, uncontrolled Check a hemoglobin A1c today. Patient's blood pressure is acceptable. I have asked him to return fasting for fasting lipid panel. I also checked a urine microalbumin - COMPLETE METABOLIC PANEL WITH GFR - Hemoglobin A1c - Microalbumin, urine

## 2013-12-08 ENCOUNTER — Other Ambulatory Visit: Payer: Self-pay | Admitting: Family Medicine

## 2013-12-13 ENCOUNTER — Other Ambulatory Visit: Payer: Self-pay | Admitting: Family Medicine

## 2013-12-22 ENCOUNTER — Other Ambulatory Visit: Payer: Self-pay | Admitting: Family Medicine

## 2013-12-23 NOTE — Telephone Encounter (Signed)
Refill appropriate and filled per protocol. 

## 2013-12-31 ENCOUNTER — Telehealth: Payer: Self-pay | Admitting: Family Medicine

## 2013-12-31 NOTE — Telephone Encounter (Signed)
Patient called concerning the RX samples for Invokana. He has seen on the Internet about a Electrical engineer for this drug. He is not sure if he should be taking this medication. He also does not know how he is to be taking the medication.  Please Advise.

## 2014-05-03 ENCOUNTER — Other Ambulatory Visit: Payer: Self-pay | Admitting: Family Medicine

## 2014-05-03 MED ORDER — LOSARTAN POTASSIUM 100 MG PO TABS: ORAL_TABLET | ORAL | Status: DC

## 2014-05-03 NOTE — Telephone Encounter (Signed)
Med sent to pharm 

## 2014-05-31 ENCOUNTER — Other Ambulatory Visit: Payer: Self-pay | Admitting: Family Medicine

## 2014-06-01 ENCOUNTER — Other Ambulatory Visit: Payer: Self-pay | Admitting: Family Medicine

## 2014-06-01 ENCOUNTER — Encounter: Payer: Self-pay | Admitting: Family Medicine

## 2014-06-01 NOTE — Telephone Encounter (Signed)
Medication refill for one time only.  Patient needs to be seen.  Letter sent for patient to call and schedule 

## 2014-06-02 ENCOUNTER — Other Ambulatory Visit: Payer: Self-pay | Admitting: Family Medicine

## 2014-06-02 NOTE — Telephone Encounter (Signed)
Medication filled x1 with no refills.   Requires office visit before any further refills can be given.   Letter sent.  

## 2014-06-17 ENCOUNTER — Ambulatory Visit (INDEPENDENT_AMBULATORY_CARE_PROVIDER_SITE_OTHER): Payer: BLUE CROSS/BLUE SHIELD | Admitting: Family Medicine

## 2014-06-17 ENCOUNTER — Encounter: Payer: Self-pay | Admitting: Family Medicine

## 2014-06-17 VITALS — BP 140/80 | HR 78 | Temp 98.1°F | Resp 18 | Ht 72.0 in | Wt 243.0 lb

## 2014-06-17 DIAGNOSIS — D485 Neoplasm of uncertain behavior of skin: Secondary | ICD-10-CM

## 2014-06-17 DIAGNOSIS — E785 Hyperlipidemia, unspecified: Secondary | ICD-10-CM

## 2014-06-17 DIAGNOSIS — I10 Essential (primary) hypertension: Secondary | ICD-10-CM

## 2014-06-17 DIAGNOSIS — E119 Type 2 diabetes mellitus without complications: Secondary | ICD-10-CM

## 2014-06-17 LAB — COMPLETE METABOLIC PANEL WITH GFR
ALT: 55 U/L — ABNORMAL HIGH (ref 0–53)
AST: 71 U/L — ABNORMAL HIGH (ref 0–37)
Albumin: 4.4 g/dL (ref 3.5–5.2)
Alkaline Phosphatase: 50 U/L (ref 39–117)
BUN: 14 mg/dL (ref 6–23)
CALCIUM: 10 mg/dL (ref 8.4–10.5)
CHLORIDE: 101 meq/L (ref 96–112)
CO2: 22 mEq/L (ref 19–32)
Creat: 0.93 mg/dL (ref 0.50–1.35)
GLUCOSE: 179 mg/dL — AB (ref 70–99)
Potassium: 4.7 mEq/L (ref 3.5–5.3)
SODIUM: 138 meq/L (ref 135–145)
TOTAL PROTEIN: 7.7 g/dL (ref 6.0–8.3)
Total Bilirubin: 0.7 mg/dL (ref 0.2–1.2)

## 2014-06-17 LAB — HEMOGLOBIN A1C
Hgb A1c MFr Bld: 8.8 % — ABNORMAL HIGH (ref <5.7)
Mean Plasma Glucose: 206 mg/dL — ABNORMAL HIGH (ref <117)

## 2014-06-17 LAB — MICROALBUMIN, URINE: Microalb, Ur: 0.4 mg/dL (ref <2.0)

## 2014-06-17 MED ORDER — METFORMIN HCL 1000 MG PO TABS: 1000.0000 mg | ORAL_TABLET | Freq: Two times a day (BID) | ORAL | Status: DC

## 2014-06-17 NOTE — Progress Notes (Signed)
Subjective:    Patient ID: Gerald [name], male    DOB: 03/17/1956, 59 y.o.   MRN: [id number]  HPI Patient is here today for follow-up of his multiple medical problems. He has a history of diabetes mellitus type 2. He is not checking his blood sugar but he denies polyuria, polydipsia, or blurred vision. Diabetic foot exam is performed today and is normal. He is due for Pneumovax 23 as well as diabetic eye exam. Blood pressure is borderline controlled at 140/80. He denies any chest pain shortness of breath or dyspnea on exertion. He denies any myalgias or right upper quadrant pain on his pravastatin that he takes for hyperlipidemia. He does have a suspicious small that is growing on his left lower back. Is approximately 5 mm in diameter. The superior left upper quadrant of the mole has a black center that seems to be changing Past Medical History  Diagnosis Date  . Upper GI bleed   . Gastric ulcer   . Anemia   . Arthritis   . Diabetes mellitus   . Unspecified essential hypertension   . Hyperlipemia   . Anal condyloma    Past Surgical History  Procedure Laterality Date  . Ganglion cyst excision     Current Outpatient Prescriptions on File Prior to Visit  Medication Sig Dispense Refill  . glipiZIDE (GLUCOTROL XL) 10 MG 24 hr tablet TAKE 1 TABLET EVERY DAY 30 tablet 5  . Glucosamine-Chondroitin-MSM (TRIPLE FLEX PO) Take by mouth.      . hydrochlorothiazide (HYDRODIURIL) 25 MG tablet TAKE 1 TABLET BY MOUTH EVERY DAY 30 tablet 11  . losartan (COZAAR) 100 MG tablet TAKE 1 TABLET BY MOUTH EVERY DAY 30 tablet 0  . Multiple Vitamin (MULTIVITAMIN PO) Take by mouth.      . pravastatin (PRAVACHOL) 20 MG tablet TAKE 1 TABLET EVERY DAY 30 tablet 0   Current Facility-Administered Medications on File Prior to Visit  Medication Dose Route Frequency Provider Last Rate Last Dose  . 0.9 %  sodium chloride infusion  500 mL Intravenous Continuous Inda Castle, MD       No Known Allergies History    Social History  . Marital Status: Married    Spouse Name: N/A  . Number of Children: N/A  . 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 Smoking status: Former Smoker    Quit date: 07/24/2010  . Smokeless tobacco: Never Used  . Alcohol Use: No  . Drug Use: No  . Sexual Activity: Not on file   Other Topics Concern  . Not on file   Social History Narrative      Review of Systems  All other systems reviewed and are negative.      Objective:   Physical Exam  Constitutional: He appears well-developed and well-nourished.  Neck: Neck supple. No JVD present. No thyromegaly present.  Cardiovascular: Normal rate, regular rhythm, normal heart sounds and intact distal pulses.  Exam reveals no gallop and no friction rub.   No murmur heard. Pulmonary/Chest: Effort normal and breath sounds normal. No respiratory distress. He has no wheezes. He has no rales. He exhibits no tenderness.  Abdominal: Soft. Bowel sounds are normal. He exhibits no distension and no mass. There is no tenderness. There is no rebound and no guarding.  Musculoskeletal: He exhibits no edema.  Lymphadenopathy:    He has no cervical adenopathy.  Vitals reviewed.  Assessment & Plan:  Essential hypertension  Diabetes mellitus type II, controlled - Plan: COMPLETE METABOLIC PANEL WITH GFR, Hemoglobin A1c, Microalbumin, urine, CANCELED: Lipid panel  HLD (hyperlipidemia)  Neoplasm of uncertain behavior of skin - Plan: Pathology  Patient's blood pressures borderline. I recommended diet exercise and weight loss. I will check a hemoglobin A1c as well as a urine microalbumin. Goal hemoglobin A1c is less than 6.5. I will check a fasting lipid panel. Goal LDL cholesterol is less than 100. Patient received Pneumovax 23 today. I recommended a diabetic eye exam. Diabetic foot exam is up-to-date. Using sterile technique, I injected the suspicious small  his left lower back 0.1% lidocaine with epinephrine. I performed a shave biopsy of the lesion and sent to pathology and labeled container. Hemostasis was achieved with Drysol and a Band-Aid. Patient tolerated procedure well with no complications.

## 2014-06-21 LAB — PATHOLOGY

## 2014-06-27 ENCOUNTER — Other Ambulatory Visit: Payer: Self-pay | Admitting: Family Medicine

## 2014-06-27 MED ORDER — HYDROCHLOROTHIAZIDE 25 MG PO TABS: 25.0000 mg | ORAL_TABLET | Freq: Every day | ORAL | Status: DC

## 2014-06-27 MED ORDER — GLIPIZIDE ER 10 MG PO TB24: 10.0000 mg | ORAL_TABLET | Freq: Every day | ORAL | Status: DC

## 2014-06-27 MED ORDER — METFORMIN HCL 1000 MG PO TABS: 1000.0000 mg | ORAL_TABLET | Freq: Two times a day (BID) | ORAL | Status: DC

## 2014-06-27 MED ORDER — PRAVASTATIN SODIUM 20 MG PO TABS: 20.0000 mg | ORAL_TABLET | Freq: Every day | ORAL | Status: DC

## 2014-06-27 MED ORDER — LOSARTAN POTASSIUM 100 MG PO TABS: 100.0000 mg | ORAL_TABLET | Freq: Every day | ORAL | Status: DC

## 2014-06-29 ENCOUNTER — Other Ambulatory Visit: Payer: Self-pay | Admitting: Family Medicine

## 2014-07-01 ENCOUNTER — Telehealth: Payer: Self-pay | Admitting: Family Medicine

## 2014-07-01 MED ORDER — GLIPIZIDE ER 10 MG PO TB24: 10.0000 mg | ORAL_TABLET | Freq: Every day | ORAL | Status: DC

## 2014-07-01 NOTE — Telephone Encounter (Signed)
Med sent to pharm 

## 2014-07-01 NOTE — Telephone Encounter (Signed)
Patient was prescribed glipizide, said that he has a rx card for 1 free year, but they would need prescription for this in order to use it  Please call back at [phone] (H)  [facility]

## 2014-07-05 ENCOUNTER — Telehealth: Payer: Self-pay | Admitting: Family Medicine

## 2014-07-05 NOTE — Telephone Encounter (Signed)
[phone] Pt states that he was given Glyxambi (did not see this on med list) to try and he is out and he is needing another rx of this [facility]

## 2014-07-06 MED ORDER — EMPAGLIFLOZIN-LINAGLIPTIN 25-5 MG PO TABS: 1.0000 | ORAL_TABLET | Freq: Every day | ORAL | Status: DC

## 2014-07-06 NOTE — Telephone Encounter (Signed)
Med sent to pharm 

## 2014-09-16 ENCOUNTER — Ambulatory Visit (INDEPENDENT_AMBULATORY_CARE_PROVIDER_SITE_OTHER): Payer: BLUE CROSS/BLUE SHIELD | Admitting: Family Medicine

## 2014-09-16 ENCOUNTER — Encounter: Payer: Self-pay | Admitting: Family Medicine

## 2014-09-16 VITALS — BP 142/90 | HR 96 | Temp 98.7°F | Resp 18 | Wt 233.0 lb

## 2014-09-16 DIAGNOSIS — E785 Hyperlipidemia, unspecified: Secondary | ICD-10-CM | POA: Diagnosis not present

## 2014-09-16 DIAGNOSIS — E119 Type 2 diabetes mellitus without complications: Secondary | ICD-10-CM

## 2014-09-16 DIAGNOSIS — J301 Allergic rhinitis due to pollen: Secondary | ICD-10-CM

## 2014-09-16 DIAGNOSIS — I10 Essential (primary) hypertension: Secondary | ICD-10-CM | POA: Diagnosis not present

## 2014-09-16 LAB — COMPLETE METABOLIC PANEL WITH GFR
ALT: 39 U/L (ref 0–53)
AST: 44 U/L — ABNORMAL HIGH (ref 0–37)
Albumin: 4.4 g/dL (ref 3.5–5.2)
Alkaline Phosphatase: 45 U/L (ref 39–117)
BUN: 20 mg/dL (ref 6–23)
CO2: 22 meq/L (ref 19–32)
Calcium: 9.7 mg/dL (ref 8.4–10.5)
Chloride: 99 mEq/L (ref 96–112)
Creat: 0.81 mg/dL (ref 0.50–1.35)
GFR, Est Non African American: >89 mL/min
Glucose, Bld: 140 mg/dL — ABNORMAL HIGH (ref 70–99)
Potassium: 4.1 mEq/L (ref 3.5–5.3)
Sodium: 136 mEq/L (ref 135–145)
Total Bilirubin: 0.7 mg/dL (ref 0.2–1.2)
Total Protein: 8 g/dL (ref 6.0–8.3)

## 2014-09-16 LAB — CBC WITH DIFFERENTIAL/PLATELET
BASOS PCT: 2 % — AB (ref 0–1)
Basophils Absolute: 0.2 10*3/uL — ABNORMAL HIGH (ref 0.0–0.1)
EOS PCT: 1 % (ref 0–5)
Eosinophils Absolute: 0.1 10*3/uL (ref 0.0–0.7)
HEMATOCRIT: 46.4 % (ref 39.0–52.0)
Hemoglobin: 15.9 g/dL (ref 13.0–17.0)
LYMPHS ABS: 1.8 10*3/uL (ref 0.7–4.0)
Lymphocytes Relative: 21 % (ref 12–46)
MCH: 29.4 pg (ref 26.0–34.0)
MCHC: 34.3 g/dL (ref 30.0–36.0)
MCV: 85.8 fL (ref 78.0–100.0)
MPV: 9.8 fL (ref 8.6–12.4)
Monocytes Absolute: 0.5 10*3/uL (ref 0.1–1.0)
Monocytes Relative: 6 % (ref 3–12)
Neutro Abs: 6.1 10*3/uL (ref 1.7–7.7)
Neutrophils Relative %: 70 % (ref 43–77)
PLATELETS: 231 10*3/uL (ref 150–400)
RBC: 5.41 MIL/uL (ref 4.22–5.81)
RDW: 14.4 % (ref 11.5–15.5)
WBC: 8.7 10*3/uL (ref 4.0–10.5)

## 2014-09-16 LAB — LIPID PANEL
CHOLESTEROL: 139 mg/dL (ref 0–200)
HDL: 30 mg/dL — ABNORMAL LOW (ref >=40)
LDL CALC: 76 mg/dL (ref 0–99)
Total CHOL/HDL Ratio: 4.6 Ratio
Triglycerides: 163 mg/dL — ABNORMAL HIGH (ref <150)
VLDL: 33 mg/dL (ref 0–40)

## 2014-09-16 LAB — HEMOGLOBIN A1C
HEMOGLOBIN A1C: 6.7 % — AB (ref <5.7)
Mean Plasma Glucose: 146 mg/dL — ABNORMAL HIGH (ref <117)

## 2014-09-16 MED ORDER — LEVOCETIRIZINE DIHYDROCHLORIDE 5 MG PO TABS: 5.0000 mg | ORAL_TABLET | Freq: Every evening | ORAL | Status: DC

## 2014-09-16 MED ORDER — MONTELUKAST SODIUM 10 MG PO TABS: 10.0000 mg | ORAL_TABLET | Freq: Every day | ORAL | Status: DC

## 2014-09-16 MED ORDER — PREDNISONE 20 MG PO TABS: ORAL_TABLET | ORAL | Status: DC

## 2014-09-16 NOTE — Addendum Note (Signed)
Addended by: Shary Decamp B on: 09/16/2014 09:00 AM   Modules accepted: Orders, Medications

## 2014-09-16 NOTE — Progress Notes (Signed)
Subjective:    Patient ID: Gerald [name], male    DOB: 08-22-1955, 59 y.o.   MRN: [id number]  HPI  Patient has lost 10 lbs since his last ov.  He has tried to change his diet considerably.  His BP today is borderline.  He is complaining of severe allergies characterized by rhinorrhea, postnasal drip, sneezing, scratchy throat, and hoarseness. He denies polyuria, polydipsia, or blurred vision. He denies myalgias or right upper quadrant pain. He denies any chest pain shortness of breath or dyspnea on exertion. Past Medical History  Diagnosis Date  . Upper GI bleed   . Gastric ulcer   . Anemia   . Arthritis   . Diabetes mellitus   . Unspecified essential hypertension   . Hyperlipemia   . Anal condyloma    Past Surgical History  Procedure Laterality Date  . Ganglion cyst excision     Current Outpatient Prescriptions on File Prior to Visit  Medication Sig Dispense Refill  . Empagliflozin-Linagliptin (GLYXAMBI) 25-5 MG TABS Take 1 tablet by mouth daily. 30 tablet 5  . glipiZIDE (GLUCOTROL XL) 10 MG 24 hr tablet Take 1 tablet (10 mg total) by mouth daily. 90 tablet 3  . Glucosamine-Chondroitin-MSM (TRIPLE FLEX PO) Take by mouth.      . hydrochlorothiazide (HYDRODIURIL) 25 MG tablet Take 1 tablet (25 mg total) by mouth daily. 90 tablet 3  . losartan (COZAAR) 100 MG tablet Take 1 tablet (100 mg total) by mouth daily. 90 tablet 3  . metFORMIN (GLUCOPHAGE) 1000 MG tablet Take 1 tablet (1,000 mg total) by mouth 2 (two) times daily. 90 tablet 3  . Multiple Vitamin (MULTIVITAMIN PO) Take by mouth.      . pravastatin (PRAVACHOL) 20 MG tablet Take 1 tablet (20 mg total) by mouth daily. 90 tablet 3  . pravastatin (PRAVACHOL) 20 MG tablet TAKE 1 TABLET EVERY DAY 30 tablet 5   Current Facility-Administered Medications on File Prior to Visit  Medication Dose Route Frequency Provider Last Rate Last Dose  . 0.9 %  sodium chloride infusion  500 mL Intravenous Continuous Inda Castle, MD        No Known Allergies History   Social History  . Marital Status: Married    Spouse Name: N/A  . Number of Children: N/A  . 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 Smoking status: Former Smoker    Quit date: 07/24/2010  . Smokeless tobacco: Never Used  . Alcohol Use: No  . Drug Use: No  . Sexual Activity: Not on file   Other Topics Concern  . Not on file   Social History Narrative     Review of Systems  All other systems reviewed and are negative.      Objective:   Physical Exam  Constitutional: He appears well-developed and well-nourished.  Neck: Neck supple.  Cardiovascular: Normal rate, regular rhythm, normal heart sounds and intact distal pulses.   No murmur heard. Pulmonary/Chest: Effort normal and breath sounds normal. No respiratory distress. He has no wheezes. He has no rales.  Abdominal: Soft. Bowel sounds are normal. He exhibits no distension. There is no tenderness. There is no rebound and no guarding.  Musculoskeletal: He exhibits no edema.  Lymphadenopathy:    He has no cervical adenopathy.  Vitals reviewed.         Assessment & Plan:  Essential hypertension  Diabetes mellitus type II, controlled -  Plan: COMPLETE METABOLIC PANEL WITH GFR, CBC with Differential/Platelet, Lipid panel, Hemoglobin A1c, Microalbumin, urine  HLD (hyperlipidemia)  Allergic rhinitis due to pollen - Plan: levocetirizine (XYZAL) 5 MG tablet  Blood pressure is borderline. I've asked the patient check his blood pressure on a daily basis and notify me of the values in one week. If persistently greater than 140/90, I would add amlodipine. I will start the patient on Xyzal 5 mg by mouth daily for allergies. If no better consider prednisone. I will check a hemoglobin A1c. Goal hemoglobin A1c is less than 6.5. If significantly less than 6.5 after the weight loss consider stopping glipizide. I will also check a  fasting lipid panel. Goal LDL cholesterol is less than 100. Diabetic foot exam is up-to-date. Patient is taking an aspirin. Recommended diabetic eye exam.

## 2014-09-17 LAB — MICROALBUMIN, URINE: Microalb, Ur: 0.2 mg/dL (ref <2.0)

## 2014-09-20 ENCOUNTER — Encounter: Payer: Self-pay | Admitting: Family Medicine

## 2014-10-10 ENCOUNTER — Telehealth: Payer: Self-pay | Admitting: Family Medicine

## 2014-10-10 MED ORDER — MONTELUKAST SODIUM 10 MG PO TABS: 10.0000 mg | ORAL_TABLET | Freq: Every day | ORAL | Status: DC

## 2014-10-10 NOTE — Telephone Encounter (Signed)
Medication called/sent to requested pharmacy  

## 2014-10-10 NOTE — Telephone Encounter (Signed)
Patient is calling to need refill on his montelukast but would like 3 month supply mail order, he says he has done mail order before but I did not see a mail order pharmacy in here for him

## 2014-12-16 ENCOUNTER — Encounter: Payer: Self-pay | Admitting: Family Medicine

## 2014-12-16 ENCOUNTER — Ambulatory Visit (INDEPENDENT_AMBULATORY_CARE_PROVIDER_SITE_OTHER): Payer: BLUE CROSS/BLUE SHIELD | Admitting: Family Medicine

## 2014-12-16 VITALS — BP 122/68 | HR 78 | Temp 98.1°F | Resp 16 | Ht 72.0 in | Wt 227.0 lb

## 2014-12-16 DIAGNOSIS — Z23 Encounter for immunization: Secondary | ICD-10-CM

## 2014-12-16 DIAGNOSIS — Z0000 Encounter for general adult medical examination without abnormal findings: Secondary | ICD-10-CM

## 2014-12-16 NOTE — Progress Notes (Signed)
Subjective:    Patient ID: Gerald [name], male    DOB: 1956/02/15, 59 y.o.   MRN: [id number]  HPI Patient is here today for complete physical exam. He was just seen in May for his diabetes check at that time, his hemoglobin A1c was found to be 6.7. His LDL cholesterol was well below 100, his CBC and CMP were normal. He is not due for repeat fasting lab work until November 2016. He is due for a PSA. Patient is due for a flu shot this fall. He is due for a tetanus shot right now. His last colonoscopy was in 2012 and was significant for tubular adenoma. He is due for repeat colonoscopy next year. Otherwise his preventative care is up-to-date. The remainder of his review of systems is negative. Past Medical History  Diagnosis Date  . Upper GI bleed   . Gastric ulcer   . Anemia   . Arthritis   . Diabetes mellitus   . Unspecified essential hypertension   . Hyperlipemia   . Anal condyloma    Past Surgical History  Procedure Laterality Date  . Ganglion cyst excision     Current Outpatient Prescriptions on File Prior to Visit  Medication Sig Dispense Refill  . Empagliflozin-Linagliptin (GLYXAMBI) 25-5 MG TABS Take 1 tablet by mouth daily. 30 tablet 5  . glipiZIDE (GLUCOTROL XL) 10 MG 24 hr tablet Take 1 tablet (10 mg total) by mouth daily. 90 tablet 3  . Glucosamine-Chondroitin-MSM (TRIPLE FLEX PO) Take by mouth.      . hydrochlorothiazide (HYDRODIURIL) 25 MG tablet Take 1 tablet (25 mg total) by mouth daily. 90 tablet 3  . losartan (COZAAR) 100 MG tablet Take 1 tablet (100 mg total) by mouth daily. 90 tablet 3  . metFORMIN (GLUCOPHAGE) 1000 MG tablet Take 1 tablet (1,000 mg total) by mouth 2 (two) times daily. 90 tablet 3  . montelukast (SINGULAIR) 10 MG tablet Take 1 tablet (10 mg total) by mouth at bedtime. 90 tablet 3  . Multiple Vitamin (MULTIVITAMIN PO) Take by mouth.      . pravastatin (PRAVACHOL) 20 MG tablet Take 1 tablet (20 mg total) by mouth daily. 90 tablet 3   Current  Facility-Administered Medications on File Prior to Visit  Medication Dose Route Frequency Provider Last Rate Last Dose  . 0.9 %  sodium chloride infusion  500 mL Intravenous Continuous Inda Castle, MD       No Known Allergies Social History   Social History  . Marital Status: Married    Spouse Name: N/A  . Number of Children: N/A  . 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 Smoking status: Former Smoker    Quit date: 07/24/2010  . Smokeless tobacco: Never Used  . Alcohol Use: No  . Drug Use: No  . Sexual Activity: Not on file   Other Topics Concern  . Not on file   Social History Narrative   Family History  Problem Relation Age of Onset  . Diabetes Brother   . Colon cancer Neg Hx     Review of Systems  All other systems reviewed and are negative.      Objective:   Physical Exam  Constitutional: He is oriented to person, place, and time. He appears well-developed and well-nourished. No distress.  HENT:  Head: Normocephalic and atraumatic.  Right Ear: External ear normal.  Left Ear: External ear normal.  Nose: Nose normal.  Mouth/Throat: Oropharynx is clear and moist. No oropharyngeal exudate.  Eyes: Conjunctivae and EOM are normal. Pupils are equal, round, and reactive to light. Right eye exhibits no discharge. Left eye exhibits no discharge. No scleral icterus.  Neck: Normal range of motion. Neck supple. No JVD present. No thyromegaly present.  Cardiovascular: Normal rate, regular rhythm, normal heart sounds and intact distal pulses.  Exam reveals no gallop and no friction rub.   No murmur heard. Pulmonary/Chest: Effort normal and breath sounds normal. No stridor. No respiratory distress. He has no wheezes. He has no rales. He exhibits no tenderness.  Abdominal: Soft. Bowel sounds are normal. He exhibits no distension. There is no tenderness. There is no rebound and no guarding.  Genitourinary:  Rectum normal, prostate normal and penis normal.  Musculoskeletal: Normal range of motion. He exhibits no edema or tenderness.  Lymphadenopathy:    He has no cervical adenopathy.  Neurological: He is alert and oriented to person, place, and time. He has normal reflexes. He displays normal reflexes. No cranial nerve deficit. He exhibits normal muscle tone. Coordination normal.  Skin: Skin is warm. No rash noted. He is not diaphoretic. No erythema. No pallor.  Psychiatric: He has a normal mood and affect. His behavior is normal. Judgment and thought content normal.  Vitals reviewed.         Assessment & Plan:  Routine general medical examination at a health care facility - Plan: PSA, Tdap vaccine greater than or equal to 7yo IM  Need for Tdap vaccination - Plan: Tdap vaccine greater than or equal to 7yo IM  Patient's physical exam is completely normal. I will check a PSA today. He is given the tetanus shot today. The remainder of his cancer screening is up-to-date. He is not due for repeat fasting lab work until November. General anticipatory guidance is provided.

## 2014-12-17 LAB — PSA: PSA: 0.36 ng/mL (ref <=4.00)

## 2014-12-19 ENCOUNTER — Encounter: Payer: Self-pay | Admitting: Family Medicine

## 2014-12-28 ENCOUNTER — Other Ambulatory Visit: Payer: Self-pay | Admitting: Family Medicine

## 2014-12-31 ENCOUNTER — Other Ambulatory Visit: Payer: Self-pay | Admitting: Family Medicine

## 2015-01-05 ENCOUNTER — Telehealth: Payer: Self-pay | Admitting: Family Medicine

## 2015-01-05 MED ORDER — METFORMIN HCL 1000 MG PO TABS: 1000.0000 mg | ORAL_TABLET | Freq: Two times a day (BID) | ORAL | Status: DC

## 2015-01-05 NOTE — Telephone Encounter (Signed)
Pt's escript for Metformin was written for 90 pills but the pt needs a 3 mo. Supply. He takes 1 pill 2x per day. You can reach him @ [phone]

## 2015-01-05 NOTE — Telephone Encounter (Signed)
Corrected qty and resent rx

## 2015-03-24 ENCOUNTER — Other Ambulatory Visit: Payer: Self-pay | Admitting: Family Medicine

## 2015-03-24 ENCOUNTER — Encounter: Payer: Self-pay | Admitting: Family Medicine

## 2015-03-24 ENCOUNTER — Ambulatory Visit (INDEPENDENT_AMBULATORY_CARE_PROVIDER_SITE_OTHER): Payer: BLUE CROSS/BLUE SHIELD | Admitting: Family Medicine

## 2015-03-24 VITALS — BP 140/80 | HR 78 | Temp 98.8°F | Resp 16 | Ht 72.0 in | Wt 236.0 lb

## 2015-03-24 DIAGNOSIS — E785 Hyperlipidemia, unspecified: Secondary | ICD-10-CM

## 2015-03-24 DIAGNOSIS — I10 Essential (primary) hypertension: Secondary | ICD-10-CM | POA: Diagnosis not present

## 2015-03-24 DIAGNOSIS — Z23 Encounter for immunization: Secondary | ICD-10-CM

## 2015-03-24 DIAGNOSIS — E119 Type 2 diabetes mellitus without complications: Secondary | ICD-10-CM

## 2015-03-24 LAB — LIPID PANEL
CHOLESTEROL: 152 mg/dL (ref 125–200)
HDL: 34 mg/dL — AB (ref >=40)
LDL Cholesterol: 88 mg/dL (ref <130)
TRIGLYCERIDES: 148 mg/dL (ref <150)
Total CHOL/HDL Ratio: 4.5 Ratio (ref <=5.0)
VLDL: 30 mg/dL (ref <30)

## 2015-03-24 LAB — COMPLETE METABOLIC PANEL WITH GFR
ALBUMIN: 4.8 g/dL (ref 3.6–5.1)
ALK PHOS: 51 U/L (ref 40–115)
ALT: 35 U/L (ref 9–46)
AST: 40 U/L — ABNORMAL HIGH (ref 10–35)
BILIRUBIN TOTAL: 0.9 mg/dL (ref 0.2–1.2)
BUN: 17 mg/dL (ref 7–25)
CO2: 26 mmol/L (ref 20–31)
Calcium: 10.2 mg/dL (ref 8.6–10.3)
Chloride: 101 mmol/L (ref 98–110)
Creat: 0.87 mg/dL (ref 0.70–1.33)
Glucose, Bld: 118 mg/dL — ABNORMAL HIGH (ref 70–99)
POTASSIUM: 4.6 mmol/L (ref 3.5–5.3)
Sodium: 139 mmol/L (ref 135–146)
TOTAL PROTEIN: 8.1 g/dL (ref 6.1–8.1)

## 2015-03-24 LAB — HEMOGLOBIN A1C
Hgb A1c MFr Bld: 6.6 % — ABNORMAL HIGH (ref <5.7)
Mean Plasma Glucose: 143 mg/dL — ABNORMAL HIGH (ref <117)

## 2015-03-24 NOTE — Progress Notes (Signed)
Subjective:    Patient ID: Gerald [name], male    DOB: 07-Jun-1955, 59 y.o.   MRN: [id number]  HPI 5/16  Patient has lost 10 lbs since his last ov.  He has tried to change his diet considerably.  His BP today is borderline.  He is complaining of severe allergies characterized by rhinorrhea, postnasal drip, sneezing, scratchy throat, and hoarseness. He denies polyuria, polydipsia, or blurred vision. He denies myalgias or right upper quadrant pain. He denies any chest pain shortness of breath or dyspnea on exertion.  At that time, my plan was: Blood pressure is borderline. I've asked the patient check his blood pressure on a daily basis and notify me of the values in one week. If persistently greater than 140/90, I would add amlodipine. I will start the patient on Xyzal 5 mg by mouth daily for allergies. If no better consider prednisone. I will check a hemoglobin A1c. Goal hemoglobin A1c is less than 6.5. If significantly less than 6.5 after the weight loss consider stopping glipizide. I will also check a fasting lipid panel. Goal LDL cholesterol is less than 100. Diabetic foot exam is up-to-date. Patient is taking an aspirin. Recommended diabetic eye exam.  03/24/15 Patient states that his blood pressure at home is typically 130-135/80. He denies any chest pain shortness of breath or dyspnea on exertion. He has not been checking his blood sugar often but when he does his fasting blood sugars are less than 130. He is not checking any postprandial sugars. He denies any hypoglycemia. He denies any neuropathy, numbness in the feet, pain in the feet. He denies any myalgias or right upper quadrant pain on his statin medication. Diabetic foot exam is performed today and is up-to-date. He is also taking an aspirin. He states that he had a diabetic eye exam earlier this year. Past Medical History  Diagnosis Date  . Upper GI bleed   . Gastric ulcer   . Anemia   . Arthritis   . Diabetes mellitus   .  Unspecified essential hypertension   . Hyperlipemia   . Anal condyloma    Past Surgical History  Procedure Laterality Date  . Ganglion cyst excision     Current Outpatient Prescriptions on File Prior to Visit  Medication Sig Dispense Refill  . glipiZIDE (GLUCOTROL XL) 10 MG 24 hr tablet Take 1 tablet (10 mg total) by mouth daily. 90 tablet 3  . Glucosamine-Chondroitin-MSM (TRIPLE FLEX PO) Take by mouth.      Marland Kitchen GLYXAMBI 25-5 MG TABS TAKE 1 TABLET BY MOUTH DAILY. 30 tablet 5  . hydrochlorothiazide (HYDRODIURIL) 25 MG tablet Take 1 tablet (25 mg total) by mouth daily. 90 tablet 3  . losartan (COZAAR) 100 MG tablet Take 1 tablet (100 mg total) by mouth daily. 90 tablet 3  . metFORMIN (GLUCOPHAGE) 1000 MG tablet Take 1 tablet (1,000 mg total) by mouth 2 (two) times daily. 180 tablet 2  . montelukast (SINGULAIR) 10 MG tablet Take 1 tablet (10 mg total) by mouth at bedtime. 90 tablet 3  . Multiple Vitamin (MULTIVITAMIN PO) Take by mouth.      . pravastatin (PRAVACHOL) 20 MG tablet Take 1 tablet (20 mg total) by mouth daily. 90 tablet 3   Current Facility-Administered Medications on File Prior to Visit  Medication Dose Route Frequency Provider Last Rate Last Dose  . 0.9 %  sodium chloride infusion  500 mL Intravenous Continuous Inda Castle, MD       No  Known Allergies Social History   Social History  . Marital Status: Married    Spouse Name: N/A  . Number of Children: N/A  . 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 Smoking status: Former Smoker    Quit date: 07/24/2010  . Smokeless tobacco: Never Used  . Alcohol Use: No  . Drug Use: No  . Sexual Activity: Not on file   Other Topics Concern  . Not on file   Social History Narrative     Review of Systems  All other systems reviewed and are negative.      Objective:   Physical Exam  Constitutional: He appears well-developed and well-nourished.  Neck: Neck  supple.  Cardiovascular: Normal rate, regular rhythm, normal heart sounds and intact distal pulses.   No murmur heard. Pulmonary/Chest: Effort normal and breath sounds normal. No respiratory distress. He has no wheezes. He has no rales.  Abdominal: Soft. Bowel sounds are normal. He exhibits no distension. There is no tenderness. There is no rebound and no guarding.  Musculoskeletal: He exhibits no edema.  Lymphadenopathy:    He has no cervical adenopathy.  Vitals reviewed.         Assessment & Plan:  Controlled type 2 diabetes mellitus without complication, without long-term current use of insulin ([place]) - Plan: COMPLETE METABOLIC PANEL WITH GFR, Lipid panel, Hemoglobin A1c, Microalbumin, urine  Essential hypertension  HLD (hyperlipidemia)  Patient is currently taking an aspirin. His diabetic eye exam is reportedly up-to-date. Diabetic foot exam is normal. He received Pneumovax 23 today in clinic. His blood pressure is acceptable. I will check a fasting lipid panel. Goal LDL cholesterol is less than 100. I will also check a hemoglobin A1c. Goal hemoglobin A1c is less than 6.5.

## 2015-03-24 NOTE — Addendum Note (Signed)
Addended by: Shary Decamp B on: 03/24/2015 09:50 AM   Modules accepted: Orders

## 2015-03-25 LAB — MICROALBUMIN, URINE: Microalb, Ur: 0.8 mg/dL

## 2015-03-28 LAB — HEPATITIS PANEL, ACUTE
HCV AB: NEGATIVE
HEP B C IGM: NONREACTIVE
Hep A IgM: NONREACTIVE
Hepatitis B Surface Ag: NEGATIVE

## 2015-04-05 ENCOUNTER — Encounter: Payer: Self-pay | Admitting: Family Medicine

## 2015-05-10 ENCOUNTER — Other Ambulatory Visit: Payer: Self-pay | Admitting: Family Medicine

## 2015-05-10 NOTE — Telephone Encounter (Signed)
Refill appropriate and filled per protocol. 

## 2015-06-17 ENCOUNTER — Other Ambulatory Visit: Payer: Self-pay | Admitting: Family Medicine

## 2015-06-23 ENCOUNTER — Encounter: Payer: Self-pay | Admitting: Family Medicine

## 2015-06-23 ENCOUNTER — Ambulatory Visit (INDEPENDENT_AMBULATORY_CARE_PROVIDER_SITE_OTHER): Payer: BLUE CROSS/BLUE SHIELD | Admitting: Family Medicine

## 2015-06-23 VITALS — BP 132/80 | HR 78 | Temp 98.3°F | Resp 18 | Ht 72.0 in | Wt 240.0 lb

## 2015-06-23 DIAGNOSIS — I10 Essential (primary) hypertension: Secondary | ICD-10-CM

## 2015-06-23 DIAGNOSIS — E785 Hyperlipidemia, unspecified: Secondary | ICD-10-CM | POA: Diagnosis not present

## 2015-06-23 DIAGNOSIS — E119 Type 2 diabetes mellitus without complications: Secondary | ICD-10-CM

## 2015-06-23 LAB — COMPLETE METABOLIC PANEL WITH GFR
ALBUMIN: 4.5 g/dL (ref 3.6–5.1)
ALK PHOS: 50 U/L (ref 40–115)
ALT: 36 U/L (ref 9–46)
AST: 43 U/L — AB (ref 10–35)
BUN: 18 mg/dL (ref 7–25)
CALCIUM: 9.8 mg/dL (ref 8.6–10.3)
CO2: 24 mmol/L (ref 20–31)
CREATININE: 0.94 mg/dL (ref 0.70–1.33)
Chloride: 99 mmol/L (ref 98–110)
GFR, Est Non African American: 88 mL/min (ref >=60)
Glucose, Bld: 126 mg/dL — ABNORMAL HIGH (ref 70–99)
Potassium: 3.9 mmol/L (ref 3.5–5.3)
Sodium: 139 mmol/L (ref 135–146)
Total Bilirubin: 0.7 mg/dL (ref 0.2–1.2)
Total Protein: 7.6 g/dL (ref 6.1–8.1)

## 2015-06-23 LAB — LIPID PANEL
Cholesterol: 130 mg/dL (ref 125–200)
HDL: 31 mg/dL — ABNORMAL LOW (ref >=40)
LDL CALC: 64 mg/dL (ref <130)
Total CHOL/HDL Ratio: 4.2 Ratio (ref <=5.0)
Triglycerides: 174 mg/dL — ABNORMAL HIGH (ref <150)
VLDL: 35 mg/dL — ABNORMAL HIGH (ref <30)

## 2015-06-23 LAB — HEMOGLOBIN A1C
HEMOGLOBIN A1C: 6.7 % — AB (ref <5.7)
MEAN PLASMA GLUCOSE: 146 mg/dL — AB (ref <117)

## 2015-06-23 MED ORDER — SILDENAFIL CITRATE 100 MG PO TABS
50.0000 mg | ORAL_TABLET | Freq: Every day | ORAL | Status: DC | PRN
Start: 2015-06-23 — End: 2016-08-09

## 2015-06-23 NOTE — Progress Notes (Signed)
Subjective:    Patient ID: Gerald [name], male    DOB: 1956/03/10, 60 y.o.   MRN: [id number]  HPI  03/24/15 Patient states that his blood pressure at home is typically 130-135/80. He denies any chest pain shortness of breath or dyspnea on exertion. He has not been checking his blood sugar often but when he does his fasting blood sugars are less than 130. He is not checking any postprandial sugars. He denies any hypoglycemia. He denies any neuropathy, numbness in the feet, pain in the feet. He denies any myalgias or right upper quadrant pain on his statin medication. Diabetic foot exam is performed today and is up-to-date. He is also taking an aspirin. He states that he had a diabetic eye exam earlier this year.  At that time, my plan was: Patient is currently taking an aspirin. His diabetic eye exam is reportedly up-to-date. Diabetic foot exam is normal. He received Pneumovax 23 today in clinic. His blood pressure is acceptable. I will check a fasting lipid panel. Goal LDL cholesterol is less than 100. I will also check a hemoglobin A1c. Goal hemoglobin A1c is less than 6.5.  06/23/15 Here for follow up.   Hemoglobin A1c at last visit was 6.6 and was normal. Labs were acceptable. Liver function tests slightly elevated so I did add a viral hepatitis panel which was normal.   We discussed today  Performing an ultrasound. I suspect fatty liver disease. The patient will have to pay for the entire ultrasound based on his insurance and prefers not to do this. His liver function test to gradually improve over the last few years as his cholesterol and blood sugar have improved and therefore he is comfortable not checking that at the present time. Otherwise he is doing well. He denies any chest pain shortness of breath or dyspnea on exertion. He denies any neuropathy in his feet area his diabetic eye exam is up-to-date. He has been screened for hepatitis C. Past Medical History  Diagnosis Date  . Upper GI  bleed   . Gastric ulcer   . Anemia   . Arthritis   . Diabetes mellitus   . Unspecified essential hypertension   . Hyperlipemia   . Anal condyloma    Past Surgical History  Procedure Laterality Date  . Ganglion cyst excision     Current Outpatient Prescriptions on File Prior to Visit  Medication Sig Dispense Refill  . glipiZIDE (GLUCOTROL XL) 10 MG 24 hr tablet Take 1 tablet (10 mg total) by mouth daily. 90 tablet 3  . GLIPIZIDE XL 10 MG 24 hr tablet TAKE 1 TABLET DAILY 90 tablet 1  . Glucosamine-Chondroitin-MSM (TRIPLE FLEX PO) Take by mouth.      Marland Kitchen GLYXAMBI 25-5 MG TABS TAKE 1 TABLET BY MOUTH DAILY. 30 tablet 5  . hydrochlorothiazide (HYDRODIURIL) 25 MG tablet TAKE 1 TABLET DAILY 90 tablet 2  . losartan (COZAAR) 100 MG tablet TAKE 1 TABLET DAILY 90 tablet 2  . metFORMIN (GLUCOPHAGE) 1000 MG tablet Take 1 tablet (1,000 mg total) by mouth 2 (two) times daily. 180 tablet 2  . montelukast (SINGULAIR) 10 MG tablet Take 1 tablet (10 mg total) by mouth at bedtime. 90 tablet 3  . Multiple Vitamin (MULTIVITAMIN PO) Take by mouth.      . pravastatin (PRAVACHOL) 20 MG tablet TAKE 1 TABLET DAILY 90 tablet 2   Current Facility-Administered Medications on File Prior to Visit  Medication Dose Route Frequency Provider Last Rate Last Dose  .  0.9 %  sodium chloride infusion  500 mL Intravenous Continuous Inda Castle, MD       No Known Allergies Social History   Social History  . Marital Status: Married    Spouse Name: N/A  . Number of Children: N/A  . 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 Smoking status: Former Smoker    Quit date: 07/24/2010  . Smokeless tobacco: Never Used  . Alcohol Use: No  . Drug Use: No  . Sexual Activity: Not on file   Other Topics Concern  . Not on file   Social History Narrative     Review of Systems  All other systems reviewed and are negative.      Objective:   Physical Exam    Constitutional: He appears well-developed and well-nourished.  Neck: Neck supple.  Cardiovascular: Normal rate, regular rhythm, normal heart sounds and intact distal pulses.   No murmur heard. Pulmonary/Chest: Effort normal and breath sounds normal. No respiratory distress. He has no wheezes. He has no rales.  Abdominal: Soft. Bowel sounds are normal. He exhibits no distension. There is no tenderness. There is no rebound and no guarding.  Musculoskeletal: He exhibits no edema.  Lymphadenopathy:    He has no cervical adenopathy.  Vitals reviewed.         Assessment & Plan:  Controlled type 2 diabetes mellitus without complication, without long-term current use of insulin ([place]) - Plan: COMPLETE METABOLIC PANEL WITH GFR, Hemoglobin A1c, Lipid panel  Essential hypertension  HLD (hyperlipidemia)   his blood pressure is excellent. I will make no changes in his medication at this time. He politely declines a right upper quadrant ultrasound which I believe is entirely appropriate. I will check a hemoglobin A1c. Goal hemoglobin A1c is less than 6.5. Goal LDL cholesterol is less than 100. If his lab is at goal, I would recommend following up in 6 months unless he has a problem. Also refill the patient's prescription for Viagra which he uses sparingly.

## 2015-06-26 ENCOUNTER — Encounter: Payer: Self-pay | Admitting: Family Medicine

## 2015-06-26 ENCOUNTER — Other Ambulatory Visit: Payer: Self-pay | Admitting: Family Medicine

## 2015-06-27 ENCOUNTER — Telehealth: Payer: Self-pay | Admitting: Family Medicine

## 2015-06-27 MED ORDER — SITAGLIPTIN PHOSPHATE 100 MG PO TABS: 100.0000 mg | ORAL_TABLET | Freq: Every day | ORAL | Status: DC

## 2015-06-27 MED ORDER — EMPAGLIFLOZIN 25 MG PO TABS: 25.0000 mg | ORAL_TABLET | Freq: Every day | ORAL | Status: DC

## 2015-06-27 NOTE — Telephone Encounter (Signed)
Insurance does not cover Glyxambi.  Pharmacy recommends Carvel Getting, Invokana or Onglyza.  Please advise??

## 2015-06-27 NOTE — Telephone Encounter (Signed)
Glyxambi is two meds.  Therefore, he would have to be on Jardiance 25 poqday and januvia 100 poqday to replace it.

## 2015-06-27 NOTE — Telephone Encounter (Signed)
New meds to pharmacy.  Tried to call pt but NA

## 2015-06-29 ENCOUNTER — Encounter: Payer: Self-pay | Admitting: Family Medicine

## 2015-06-29 NOTE — Telephone Encounter (Signed)
phamacy [facility]  Glyxambi patient needs refill on this if possible  [phone] (H)

## 2015-07-03 NOTE — Telephone Encounter (Signed)
This encounter was created in error - please disregard.

## 2015-07-12 NOTE — Telephone Encounter (Signed)
Been trying to call, got voice mail today left message

## 2015-07-25 ENCOUNTER — Ambulatory Visit (INDEPENDENT_AMBULATORY_CARE_PROVIDER_SITE_OTHER): Payer: BLUE CROSS/BLUE SHIELD | Admitting: Family Medicine

## 2015-07-25 ENCOUNTER — Encounter: Payer: Self-pay | Admitting: Family Medicine

## 2015-07-25 VITALS — BP 128/68 | HR 82 | Temp 98.1°F | Resp 18 | Ht 72.0 in | Wt 239.0 lb

## 2015-07-25 DIAGNOSIS — L209 Atopic dermatitis, unspecified: Secondary | ICD-10-CM | POA: Diagnosis not present

## 2015-07-25 MED ORDER — ZOSTER VACCINE LIVE 19400 UNT/0.65ML ~~LOC~~ SOLR: 0.6500 mL | Freq: Once | SUBCUTANEOUS | Status: DC

## 2015-07-25 MED ORDER — MOMETASONE FUROATE 0.1 % EX OINT: TOPICAL_OINTMENT | Freq: Every day | CUTANEOUS | Status: DC

## 2015-07-25 NOTE — Progress Notes (Signed)
   Subjective:    Patient ID: Gerald [name], male    DOB: 1955/06/10, 60 y.o.   MRN: [id number]  HPI  The patient has a rash limited to the medial aspect of both inner thighs from his groin to his knees. The rash consists of erythematous papules and plaques. The smallest are 4 mm in diameter. The largest are 2 cm in diameter. They're violaceous/erythematous plaques with fine white scale. They're not in clusters or any groupings. They're located nowhere else on the body. It has been present for 3 weeks Past Medical History  Diagnosis Date  . Upper GI bleed   . Gastric ulcer   . Anemia   . Arthritis   . Diabetes mellitus   . Unspecified essential hypertension   . Hyperlipemia   . Anal condyloma    Past Surgical History  Procedure Laterality Date  . Ganglion cyst excision     Current Outpatient Prescriptions on File Prior to Visit  Medication Sig Dispense Refill  . empagliflozin (JARDIANCE) 25 MG TABS tablet Take 25 mg by mouth daily. 30 tablet 5  . glipiZIDE (GLUCOTROL XL) 10 MG 24 hr tablet Take 1 tablet (10 mg total) by mouth daily. 90 tablet 3  . Glucosamine-Chondroitin-MSM (TRIPLE FLEX PO) Take by mouth.      . hydrochlorothiazide (HYDRODIURIL) 25 MG tablet TAKE 1 TABLET DAILY 90 tablet 2  . losartan (COZAAR) 100 MG tablet TAKE 1 TABLET DAILY 90 tablet 2  . metFORMIN (GLUCOPHAGE) 1000 MG tablet Take 1 tablet (1,000 mg total) by mouth 2 (two) times daily. 180 tablet 2  . montelukast (SINGULAIR) 10 MG tablet Take 1 tablet (10 mg total) by mouth at bedtime. 90 tablet 3  . Multiple Vitamin (MULTIVITAMIN PO) Take by mouth.      . pravastatin (PRAVACHOL) 20 MG tablet TAKE 1 TABLET DAILY 90 tablet 2  . sildenafil (VIAGRA) 100 MG tablet Take 0.5-1 tablets (50-100 mg total) by mouth daily as needed for erectile dysfunction. 10 tablet 11  . sitaGLIPtin (JANUVIA) 100 MG tablet Take 1 tablet (100 mg total) by mouth daily. 30 tablet 5   No current facility-administered medications on  file prior to visit.   No Known Allergies Social History   Social History  . Marital Status: Married    Spouse Name: N/A  . Number of Children: N/A  . 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 Smoking status: Former Smoker    Quit date: 07/24/2010  . Smokeless tobacco: Never Used  . Alcohol Use: No  . Drug Use: No  . Sexual Activity: Not on file   Other Topics Concern  . Not on file   Social History Narrative    Review of Systems  All other systems reviewed and are negative.      Objective:   Physical Exam  Cardiovascular: Normal rate, regular rhythm and normal heart sounds.   Pulmonary/Chest: Effort normal and breath sounds normal.  Skin: Rash noted. There is erythema.  Vitals reviewed.         Assessment & Plan:  Atopic dermatitis - Plan: mometasone (ELOCON) 0.1 % ointment  Differential diagnosis includes guttate psoriasis, nummular eczema, spongiform dermatitis. At this point I am not sure. I will treat the patient with Elocon ointment twice daily for 10 days. If no better return for biopsy. Patient would also like to receive the shingles vaccine.

## 2015-08-14 ENCOUNTER — Other Ambulatory Visit: Payer: Self-pay | Admitting: Family Medicine

## 2015-09-08 ENCOUNTER — Other Ambulatory Visit: Payer: Self-pay | Admitting: Family Medicine

## 2015-09-08 MED ORDER — EMPAGLIFLOZIN 25 MG PO TABS: 25.0000 mg | ORAL_TABLET | Freq: Every day | ORAL | Status: DC

## 2015-09-08 MED ORDER — SITAGLIPTIN PHOSPHATE 100 MG PO TABS: 100.0000 mg | ORAL_TABLET | Freq: Every day | ORAL | Status: DC

## 2015-09-08 NOTE — Telephone Encounter (Signed)
Pt needs a refill of Januvia 100 mg and Jardiance 25 mg sent to [facility]

## 2015-09-08 NOTE — Telephone Encounter (Signed)
Prescription sent to pharmacy.

## 2015-09-28 ENCOUNTER — Other Ambulatory Visit: Payer: Self-pay | Admitting: Family Medicine

## 2015-09-28 NOTE — Telephone Encounter (Signed)
Refill appropriate and filled per protocol. 

## 2015-10-10 ENCOUNTER — Encounter: Payer: Self-pay | Admitting: Gastroenterology

## 2015-12-13 ENCOUNTER — Other Ambulatory Visit: Payer: Self-pay | Admitting: Family Medicine

## 2015-12-13 NOTE — Telephone Encounter (Signed)
Refill appropriate and filled per protocol. 

## 2016-01-12 ENCOUNTER — Other Ambulatory Visit: Payer: Self-pay | Admitting: Family Medicine

## 2016-01-18 ENCOUNTER — Ambulatory Visit (INDEPENDENT_AMBULATORY_CARE_PROVIDER_SITE_OTHER): Payer: BLUE CROSS/BLUE SHIELD | Admitting: Family Medicine

## 2016-01-18 ENCOUNTER — Encounter: Payer: Self-pay | Admitting: Family Medicine

## 2016-01-18 VITALS — BP 142/80 | HR 78 | Temp 98.0°F | Resp 16 | Ht 72.0 in | Wt 235.0 lb

## 2016-01-18 DIAGNOSIS — Z23 Encounter for immunization: Secondary | ICD-10-CM

## 2016-01-18 DIAGNOSIS — Z0000 Encounter for general adult medical examination without abnormal findings: Secondary | ICD-10-CM

## 2016-01-18 DIAGNOSIS — E785 Hyperlipidemia, unspecified: Secondary | ICD-10-CM | POA: Diagnosis not present

## 2016-01-18 DIAGNOSIS — I10 Essential (primary) hypertension: Secondary | ICD-10-CM

## 2016-01-18 DIAGNOSIS — E119 Type 2 diabetes mellitus without complications: Secondary | ICD-10-CM

## 2016-01-18 LAB — CBC WITH DIFFERENTIAL/PLATELET
BASOS ABS: 98 {cells}/uL (ref 0–200)
Basophils Relative: 1 %
EOS ABS: 98 {cells}/uL (ref 15–500)
EOS PCT: 1 %
HCT: 48.1 % (ref 38.5–50.0)
Hemoglobin: 16.3 g/dL (ref 13.0–17.0)
LYMPHS PCT: 18 %
Lymphs Abs: 1764 cells/uL (ref 850–3900)
MCH: 29.5 pg (ref 27.0–33.0)
MCHC: 33.9 g/dL (ref 32.0–36.0)
MCV: 87.1 fL (ref 80.0–100.0)
MONOS PCT: 6 %
MPV: 10.5 fL (ref 7.5–12.5)
Monocytes Absolute: 588 cells/uL (ref 200–950)
NEUTROS ABS: 7252 {cells}/uL (ref 1500–7800)
Neutrophils Relative %: 74 %
PLATELETS: 224 10*3/uL (ref 140–400)
RBC: 5.52 MIL/uL (ref 4.20–5.80)
RDW: 14.1 % (ref 11.0–15.0)
WBC: 9.8 10*3/uL (ref 3.8–10.8)

## 2016-01-18 LAB — COMPLETE METABOLIC PANEL WITH GFR
ALK PHOS: 45 U/L (ref 40–115)
ALT: 48 U/L — ABNORMAL HIGH (ref 9–46)
AST: 56 U/L — ABNORMAL HIGH (ref 10–35)
Albumin: 4.5 g/dL (ref 3.6–5.1)
BUN: 14 mg/dL (ref 7–25)
CHLORIDE: 100 mmol/L (ref 98–110)
CO2: 23 mmol/L (ref 20–31)
Calcium: 9.8 mg/dL (ref 8.6–10.3)
Creat: 0.85 mg/dL (ref 0.70–1.25)
GLUCOSE: 116 mg/dL — AB (ref 70–99)
POTASSIUM: 4.1 mmol/L (ref 3.5–5.3)
SODIUM: 139 mmol/L (ref 135–146)
TOTAL PROTEIN: 7.7 g/dL (ref 6.1–8.1)
Total Bilirubin: 0.7 mg/dL (ref 0.2–1.2)

## 2016-01-18 LAB — LIPID PANEL
Cholesterol: 139 mg/dL (ref 125–200)
HDL: 34 mg/dL — ABNORMAL LOW (ref >=40)
LDL CALC: 77 mg/dL (ref <130)
TRIGLYCERIDES: 139 mg/dL (ref <150)
Total CHOL/HDL Ratio: 4.1 Ratio (ref <=5.0)
VLDL: 28 mg/dL (ref <30)

## 2016-01-18 NOTE — Progress Notes (Signed)
Subjective:    Patient ID: Gerald [name], male    DOB: 1956/02/23, 60 y.o.   MRN: [id number]  HPI Patient is here today for complete physical exam.  His last colonoscopy was in 2012 and was significant for tubular adenoma. He is due for repeat colonoscopy and he will schedule that this year. The remainder of his review of systems is negative.  Due for flu shot and zostavax.  Hep C screen is UTD.  Refuses HIV screen Immunization History  Administered Date(s) Administered  . Influenza,inj,Quad PF,36+ Mos 01/21/2015  . Influenza-Unspecified 01/13/2014  . Pneumococcal Polysaccharide-23 03/24/2015  . Tdap 12/16/2014    Past Medical History:  Diagnosis Date  . Anal condyloma   . Anemia   . Arthritis   . Diabetes mellitus   . Gastric ulcer   . Hyperlipemia   . Unspecified essential hypertension   . Upper GI bleed    Past Surgical History:  Procedure Laterality Date  . GANGLION CYST EXCISION     Current Outpatient Prescriptions on File Prior to Visit  Medication Sig Dispense Refill  . empagliflozin (JARDIANCE) 25 MG TABS tablet Take 25 mg by mouth daily. 90 tablet 3  . glipiZIDE (GLUCOTROL XL) 10 MG 24 hr tablet TAKE 1 TABLET DAILY 90 tablet 0  . Glucosamine-Chondroitin-MSM (TRIPLE FLEX PO) Take by mouth.      . hydrochlorothiazide (HYDRODIURIL) 25 MG tablet TAKE 1 TABLET DAILY 90 tablet 2  . losartan (COZAAR) 100 MG tablet TAKE 1 TABLET DAILY 90 tablet 2  . metFORMIN (GLUCOPHAGE) 1000 MG tablet TAKE 1 TABLET TWICE A DAY 180 tablet 1  . mometasone (ELOCON) 0.1 % ointment Apply topically daily. 45 g 0  . montelukast (SINGULAIR) 10 MG tablet TAKE 1 TABLET AT BEDTIME 90 tablet 2  . Multiple Vitamin (MULTIVITAMIN PO) Take by mouth.      . pravastatin (PRAVACHOL) 20 MG tablet TAKE 1 TABLET DAILY 90 tablet 2  . sildenafil (VIAGRA) 100 MG tablet Take 0.5-1 tablets (50-100 mg total) by mouth daily as needed for erectile dysfunction. 10 tablet 11  . sitaGLIPtin (JANUVIA) 100 MG tablet  Take 1 tablet (100 mg total) by mouth daily. 90 tablet 3   No current facility-administered medications on file prior to visit.    No Known Allergies Social History   Social History  . Marital status: Married    Spouse name: Gerald [name]  . Number of children: Gerald [name]  . 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 Smoking status: Former Smoker    Quit date: 07/24/2010  . Smokeless tobacco: Never Used  . Alcohol use No  . Drug use: No  . Sexual activity: Not on file   Other Topics Concern  . Not on file   Social History Narrative  . No narrative on file   Family History  Problem Relation Age of Onset  . Diabetes Brother   . Colon cancer Neg Hx     Review of Systems  All other systems reviewed and are negative.      Objective:   Physical Exam  Constitutional: He is oriented to person, place, and time. He appears well-developed and well-nourished. No distress.  HENT:  Head: Normocephalic and atraumatic.  Right Ear: External ear normal.  Left Ear: External ear normal.  Nose: Nose normal.  Mouth/Throat: Oropharynx is clear and moist. No oropharyngeal exudate.  Eyes: Conjunctivae and EOM are normal. Pupils are  equal, round, and reactive to light. Right eye exhibits no discharge. Left eye exhibits no discharge. No scleral icterus.  Neck: Normal range of motion. Neck supple. No JVD present. No thyromegaly present.  Cardiovascular: Normal rate, regular rhythm, normal heart sounds and intact distal pulses.  Exam reveals no gallop and no friction rub.   No murmur heard. Pulmonary/Chest: Effort normal and breath sounds normal. No stridor. No respiratory distress. He has no wheezes. He has no rales. He exhibits no tenderness.  Abdominal: Soft. Bowel sounds are normal. He exhibits no distension. There is no tenderness. There is no rebound and no guarding.  Musculoskeletal: Normal range of motion. He exhibits no edema or tenderness.    Lymphadenopathy:    He has no cervical adenopathy.  Neurological: He is alert and oriented to person, place, and time. He has normal reflexes. No cranial nerve deficit. He exhibits normal muscle tone. Coordination normal.  Skin: Skin is warm. No rash noted. He is not diaphoretic. No erythema. No pallor.  Psychiatric: He has a normal mood and affect. His behavior is normal. Judgment and thought content normal.  Vitals reviewed.         Assessment & Plan:  Controlled type 2 diabetes mellitus without complication, without long-term current use of insulin ([place]) - Plan: CBC with Differential/Platelet, COMPLETE METABOLIC PANEL WITH GFR, Lipid panel, Hemoglobin A1c, Microalbumin, urine  Essential hypertension  HLD (hyperlipidemia)  Routine general medical examination at a health care facility - Plan: PSA  Patient's physical exam is completely normal. I will check a PSA today.  I will also check Hga1c (goal <6.5), FLP (goal ldl<100) urine microalbumin, cbc, and cmp. Foot exam is nml.  Recommended diabetci eye exam annually.  Received flu shot and zostavax. General anticipatory guidance is provided.

## 2016-01-18 NOTE — Addendum Note (Signed)
Addended by: Shary Decamp B on: 01/18/2016 11:49 AM   Modules accepted: Orders

## 2016-01-19 LAB — HEMOGLOBIN A1C
Hgb A1c MFr Bld: 6.7 % — ABNORMAL HIGH (ref <5.7)
MEAN PLASMA GLUCOSE: 146 mg/dL

## 2016-01-19 LAB — MICROALBUMIN, URINE: Microalb, Ur: 0.7 mg/dL

## 2016-01-19 LAB — PSA: PSA: 0.3 ng/mL (ref <=4.0)

## 2016-02-04 ENCOUNTER — Other Ambulatory Visit: Payer: Self-pay | Admitting: Family Medicine

## 2016-02-14 ENCOUNTER — Encounter: Payer: Self-pay | Admitting: Family Medicine

## 2016-03-08 ENCOUNTER — Encounter (HOSPITAL_COMMUNITY)
Admission: EM | Disposition: A | Discharge status: Home / Self Care | Payer: Self-pay | Source: Home / Self Care | Attending: Emergency Medicine

## 2016-03-08 ENCOUNTER — Emergency Department (HOSPITAL_COMMUNITY): Payer: BLUE CROSS/BLUE SHIELD

## 2016-03-08 ENCOUNTER — Encounter (HOSPITAL_COMMUNITY): Payer: Self-pay | Admitting: *Deleted

## 2016-03-08 ENCOUNTER — Ambulatory Visit (HOSPITAL_COMMUNITY)
Admission: EM | Admit: 2016-03-08 | Discharge: 2016-03-08 | Disposition: A | Discharge status: Home / Self Care | Payer: BLUE CROSS/BLUE SHIELD | Attending: Emergency Medicine | Admitting: Emergency Medicine

## 2016-03-08 ENCOUNTER — Ambulatory Visit (HOSPITAL_COMMUNITY): Payer: BLUE CROSS/BLUE SHIELD | Admitting: Anesthesiology

## 2016-03-08 DIAGNOSIS — K2970 Gastritis, unspecified, without bleeding: Secondary | ICD-10-CM | POA: Diagnosis not present

## 2016-03-08 DIAGNOSIS — S1095XA Superficial foreign body of unspecified part of neck, initial encounter: Secondary | ICD-10-CM | POA: Diagnosis not present

## 2016-03-08 DIAGNOSIS — K254 Chronic or unspecified gastric ulcer with hemorrhage: Secondary | ICD-10-CM | POA: Diagnosis not present

## 2016-03-08 DIAGNOSIS — I10 Essential (primary) hypertension: Secondary | ICD-10-CM | POA: Diagnosis not present

## 2016-03-08 DIAGNOSIS — E119 Type 2 diabetes mellitus without complications: Secondary | ICD-10-CM | POA: Insufficient documentation

## 2016-03-08 DIAGNOSIS — K209 Esophagitis, unspecified: Secondary | ICD-10-CM | POA: Insufficient documentation

## 2016-03-08 DIAGNOSIS — Z7982 Long term (current) use of aspirin: Secondary | ICD-10-CM | POA: Insufficient documentation

## 2016-03-08 DIAGNOSIS — W44F3XA Food entering into or through a natural orifice, initial encounter: Secondary | ICD-10-CM

## 2016-03-08 DIAGNOSIS — R1310 Dysphagia, unspecified: Secondary | ICD-10-CM | POA: Insufficient documentation

## 2016-03-08 DIAGNOSIS — T18128A Food in esophagus causing other injury, initial encounter: Secondary | ICD-10-CM | POA: Diagnosis not present

## 2016-03-08 DIAGNOSIS — K222 Esophageal obstruction: Secondary | ICD-10-CM | POA: Insufficient documentation

## 2016-03-08 DIAGNOSIS — Z87891 Personal history of nicotine dependence: Secondary | ICD-10-CM | POA: Diagnosis not present

## 2016-03-08 DIAGNOSIS — X58XXXA Exposure to other specified factors, initial encounter: Secondary | ICD-10-CM | POA: Insufficient documentation

## 2016-03-08 DIAGNOSIS — R0989 Other specified symptoms and signs involving the circulatory and respiratory systems: Secondary | ICD-10-CM | POA: Diagnosis not present

## 2016-03-08 DIAGNOSIS — M1990 Unspecified osteoarthritis, unspecified site: Secondary | ICD-10-CM | POA: Diagnosis not present

## 2016-03-08 DIAGNOSIS — E785 Hyperlipidemia, unspecified: Secondary | ICD-10-CM | POA: Diagnosis not present

## 2016-03-08 DIAGNOSIS — Z7984 Long term (current) use of oral hypoglycemic drugs: Secondary | ICD-10-CM | POA: Diagnosis not present

## 2016-03-08 HISTORY — DX: Abnormal results of liver function studies: R94.5

## 2016-03-08 HISTORY — PX: ESOPHAGOGASTRODUODENOSCOPY (EGD) WITH PROPOFOL: SHX5813

## 2016-03-08 SURGERY — CANCELLED PROCEDURE
Anesthesia: Monitor Anesthesia Care

## 2016-03-08 SURGERY — ESOPHAGOGASTRODUODENOSCOPY (EGD) WITH PROPOFOL
Anesthesia: Monitor Anesthesia Care

## 2016-03-08 MED ORDER — PROPOFOL 500 MG/50ML IV EMUL
INTRAVENOUS | Status: DC | PRN
  Administered 2016-03-08: 100 ug/kg/min via INTRAVENOUS

## 2016-03-08 MED ORDER — PROPOFOL 10 MG/ML IV BOLUS
INTRAVENOUS | Status: AC
  Filled 2016-03-08: qty 20

## 2016-03-08 MED ORDER — GLUCAGON HCL RDNA (DIAGNOSTIC) 1 MG IJ SOLR
1.0000 mg | Freq: Once | INTRAMUSCULAR | Status: AC
  Administered 2016-03-08: 1 mg via INTRAVENOUS
  Filled 2016-03-08: qty 1

## 2016-03-08 MED ORDER — PROPOFOL 500 MG/50ML IV EMUL
INTRAVENOUS | Status: DC | PRN
  Administered 2016-03-08: 50 mg via INTRAVENOUS
  Administered 2016-03-08: 30 mg via INTRAVENOUS

## 2016-03-08 MED ORDER — PANTOPRAZOLE SODIUM 40 MG PO TBEC: 40.0000 mg | DELAYED_RELEASE_TABLET | Freq: Every day | ORAL | 3 refills | Status: DC

## 2016-03-08 MED ORDER — LACTATED RINGERS IV SOLN
INTRAVENOUS | Status: DC
  Administered 2016-03-08: 16:00:00 via INTRAVENOUS

## 2016-03-08 SURGICAL SUPPLY — 14 items

## 2016-03-08 NOTE — ED Notes (Signed)
Pt was picked up and taken for procedure

## 2016-03-08 NOTE — Transfer of Care (Signed)
Immediate Anesthesia Transfer of Care Note  Patient: Gerald [name]  Procedure(s) Performed: Procedure(s): ESOPHAGOGASTRODUODENOSCOPY (EGD) WITH PROPOFOL (N/A)  Patient Location: PACU  Anesthesia Type:MAC  Level of Consciousness: awake, alert  and oriented  Airway & Oxygen Therapy: Patient Spontanous Breathing and Patient connected to nasal cannula oxygen  Post-op Assessment: Report given to RN and Post -op Vital signs reviewed and stable  Post vital signs: Reviewed and stable  Last Vitals:  Vitals:   03/08/16 1343 03/08/16 1607  BP: 156/90 141/83  Pulse: 88 83  Resp: 18 21  Temp: 36.6 C 36.7 C    Last Pain:  Vitals:   03/08/16 1607  TempSrc: Oral         Complications: No apparent anesthesia complications

## 2016-03-08 NOTE — Op Note (Signed)
[facility] Patient Name: Gerald [name] Procedure Date: 03/08/2016 MRN: [id number] Attending MD: Jerene Bears , MD Date of Birth: 1955-12-24 CSN: [id number] Age: 60 Admit Type: Outpatient Procedure:                Upper GI endoscopy Indications:              Dysphagia, Foreign body in the esophagus Providers:                Lajuan Lines. Hilarie Fredrickson, MD, Zenon Mayo, RN, Corliss Parish, Technician Referring MD:             Ocie Cornfield, PA-C Medicines:                Monitored Anesthesia Care Complications:            No immediate complications. Estimated Blood Loss:     Estimated blood loss: none. Procedure:                Pre-Anesthesia Assessment:                           - Prior to the procedure, a History and Physical                            was performed, and patient medications and                            allergies were reviewed. The patient's tolerance of                            previous anesthesia was also reviewed. The risks                            and benefits of the procedure and the sedation                            options and risks were discussed with the patient.                            All questions were answered, and informed consent                            was obtained. Prior Anticoagulants: The patient has                            taken no previous anticoagulant or antiplatelet                            agents. ASA Grade Assessment: III - A patient with                            severe systemic disease. After reviewing the risks  and benefits, the patient was deemed in                            satisfactory condition to undergo the procedure.                           After obtaining informed consent, the endoscope was                            passed under direct vision. Throughout the                            procedure, the patient's blood pressure, pulse, and                      oxygen saturations were monitored continuously. The                            [id number] [id number]) scope was introduced through the                            mouth, and advanced to the second part of duodenum.                            The upper GI endoscopy was accomplished without                            difficulty. The patient tolerated the procedure                            well. Scope In: Scope Out: Findings:      Food was found in the lower third of the esophagus. The scope was able       to maneuver around the food impaction and enter the stomach. Once this       was determined the food was carefully and slowly advanced into the       stomach.      Mildly severe esophagitis with no bleeding was found in the lower third       of the esophagus due to food impaction (pressure injury).      A low-grade of narrowing Schatzki ring (acquired) was found at the       gastroesophageal junction.      Localized moderate inflammation characterized by erosions and erythema       was found in the gastric antrum.      Diffuse mildly erythematous mucosa without active bleeding and with no       stigmata of bleeding was found in the duodenal bulb.      The second portion of the duodenum was normal. Impression:               - Food in the lower third of the esophagus. Removal                            was successful.                           -  Mildly severe esophagitis.                           - Low-grade of narrowing Schatzki ring.                           - Gastritis.                           - Erythematous duodenopathy.                           - Normal second portion of the duodenum. Moderate Sedation:      N/A Recommendation:           - Patient has a contact number available for                            emergencies. The signs and symptoms of potential                            delayed complications were discussed with the                             patient. Return to normal activities tomorrow.                            Written discharge instructions were provided to the                            patient.                           - Soft diet. Chew food very well, take small bites,                            eat slowly.                           - Repeat upper endoscopy at appointment to be                            scheduled in 4-6 weeks for probable dilation.                           - Pantoprazole 40 mg once daily until next EGD                           - No NSAIDs.                           - Okay for discharge from GI perspective.                           - Continue present medications. Procedure Code(s):        --- Professional ---  [phone], Esophagogastroduodenoscopy, flexible,                            transoral; with removal of foreign body(s) Diagnosis Code(s):        --- Professional ---                           [id number], Food in esophagus causing other injury,                            initial encounter                           K20.9, Esophagitis, unspecified                           K22.2, Esophageal obstruction                           K29.70, Gastritis, unspecified, without bleeding                           K31.89, Other diseases of stomach and duodenum                           R13.10, Dysphagia, unspecified                           T18.108A, Unspecified foreign body in esophagus                            causing other injury, initial encounter CPT copyright 2016 [employer]. All rights reserved. The codes documented in this report are preliminary and upon coder review may  be revised to meet current compliance requirements. Jerene Bears, MD 03/08/2016 5:16:51 PM This report has been signed electronically. Number of Addenda: 0

## 2016-03-08 NOTE — ED Provider Notes (Signed)
[facility] DEPT Provider Note   CSN: [id number] Arrival date & time: 03/08/16  1332     History   Chief Complaint Chief Complaint  Patient presents with  . Foreign Body    can't swallow    HPI Gerald [name] is a 60 y.o. male.  60 year old Caucasian male with a past medical history significant for diabetes, gastric ulcer that presents to the ED today with foreign body sensation in his throat. The patient states that he was eating spicy chicken wings on Wednesday and felt like something got stuck in his throat. Patient states that he is continuously something stuck in his throat since. Patient states he tried to eat a little bit of soft gravy yesterday but was able to swallow it. Patient states he is able to swallow his saliva but after a few minutes he throws it right back up. Patient also reports having increased difficulty swallowing over the past 6 months after his EGD was performed for gastric ulcer. Patient denies any pain. He states that his voice has become hoarse today. Patient denies any shortness of breath or chest pain. Patient denies trying anything at home for his symptoms. Denies any pain. Denies prior history of same. Denies any fever, chills, headache, vision changes, lightheadedness, dizziness, chest pain, shortness of breath, abdominal pain, nausea, emesis, urinary symptoms, change in bowel habits, numbness/tingling.      Past Medical History:  Diagnosis Date  . Anal condyloma   . Anemia   . Arthritis   . Diabetes mellitus   . Gastric ulcer   . Hyperlipemia   . Unspecified essential hypertension   . Upper GI bleed     Patient Active Problem List   Diagnosis Date Noted  . Gastric ulcer with hemorrhage 09/13/2010  . Special screening for malignant neoplasms, colon 09/13/2010    Past Surgical History:  Procedure Laterality Date  . GANGLION CYST EXCISION         Home Medications    Prior to Admission medications   Medication Sig Start Date  End Date Taking? Authorizing Provider  aspirin EC 81 MG tablet Take 81 mg by mouth at bedtime.   Yes Historical Provider, MD  empagliflozin (JARDIANCE) 25 MG TABS tablet Take 25 mg by mouth daily. 09/08/15  Yes Susy Frizzle, MD  glipiZIDE (GLUCOTROL XL) 10 MG 24 hr tablet TAKE 1 TABLET DAILY 12/13/15  Yes Susy Frizzle, MD  hydrochlorothiazide (HYDRODIURIL) 25 MG tablet TAKE 1 TABLET DAILY 02/05/16  Yes Susy Frizzle, MD  losartan (COZAAR) 100 MG tablet TAKE 1 TABLET DAILY 02/05/16  Yes Susy Frizzle, MD  metFORMIN (GLUCOPHAGE) 1000 MG tablet TAKE 1 TABLET TWICE A DAY 09/28/15  Yes Susy Frizzle, MD  Misc Natural Products (TRIPLE FLEX) CAPS Take 1 capsule by mouth.   Yes Historical Provider, MD  montelukast (SINGULAIR) 10 MG tablet TAKE 1 TABLET AT BEDTIME 08/14/15  Yes Susy Frizzle, MD  Multiple Vitamin (MULTIVITAMIN PO) Take by mouth.     Yes Historical Provider, MD  naproxen sodium (ANAPROX) 220 MG tablet Take 440 mg by mouth 2 (two) times daily with a meal.   Yes Historical Provider, MD  pravastatin (PRAVACHOL) 20 MG tablet TAKE 1 TABLET DAILY 01/12/16  Yes Susy Frizzle, MD  mometasone (ELOCON) 0.1 % ointment Apply topically daily. Patient not taking: Reported on 03/08/2016 07/25/15   Susy Frizzle, MD  sildenafil (VIAGRA) 100 MG tablet Take 0.5-1 tablets (50-100 mg total) by mouth daily as  needed for erectile dysfunction. 06/23/15   Susy Frizzle, MD  sitaGLIPtin (JANUVIA) 100 MG tablet Take 1 tablet (100 mg total) by mouth daily. Patient not taking: Reported on 03/08/2016 09/08/15   Susy Frizzle, MD    Family History Family History  Problem Relation Age of Onset  . Diabetes Brother   . Colon cancer Neg Hx     Social History Social History  Substance Use Topics  . Smoking status: Former Smoker    Quit date: 07/24/2010  . Smokeless tobacco: Never Used  . Alcohol use No     Allergies   Patient has no known allergies.   Review of Systems Review of  Systems  Constitutional: Negative for chills and fever.  HENT: Negative for congestion, ear pain, rhinorrhea and sore throat.   Eyes: Negative for pain and discharge.  Respiratory: Negative for cough and shortness of breath.   Cardiovascular: Negative for chest pain and palpitations.  Gastrointestinal: Negative for abdominal pain, diarrhea, nausea and vomiting.  Genitourinary: Negative for flank pain, frequency, hematuria and urgency.  Musculoskeletal: Negative for myalgias and neck pain.  Neurological: Negative for dizziness, syncope, weakness, light-headedness, numbness and headaches.  All other systems reviewed and are negative.    Physical Exam Updated Vital Signs BP 156/90 (BP Location: Left Arm)   Pulse 88   Temp 97.8 F (36.6 C) (Oral)   Resp 18   SpO2 94%   Physical Exam  Constitutional: He appears well-developed and well-nourished. No distress.  A patient speaking in complete sentences and tolerating his secretions at this time.  HENT:  Head: Normocephalic and atraumatic.  Mouth/Throat: Oropharynx is clear and moist.  Eyes: Conjunctivae are normal. Pupils are equal, round, and reactive to light. Right eye exhibits no discharge. Left eye exhibits no discharge. No scleral icterus.  Neck: Normal range of motion. Neck supple. No thyromegaly present.  Cardiovascular: Normal rate, regular rhythm, normal heart sounds and intact distal pulses.  Exam reveals no gallop and no friction rub.   No murmur heard. Pulmonary/Chest: Effort normal and breath sounds normal. No respiratory distress.  The patient is speaking complete sentences and tolerating his secretions. No hypoxia or tachypnea noted.  Abdominal: Soft. Bowel sounds are normal. He exhibits no distension. There is no tenderness.  Musculoskeletal: Normal range of motion.  Lymphadenopathy:    He has no cervical adenopathy.  Neurological: He is alert.  Skin: Skin is warm and dry.  Nursing note and vitals reviewed.    ED  Treatments / Results  Labs (all labs ordered are listed, but only abnormal results are displayed) Labs Reviewed - No data to display  EKG  EKG Interpretation None       Radiology Dg Neck Soft Tissue  Result Date: 03/08/2016 CLINICAL DATA:  Pt states he feels like something is stuck in his throat anteriorly, about mid throat x 4 days. Pt also stated that he is having trouble getting liquids and solids down x 4 days. Sx: endoscopy(1 X ) EXAM: NECK SOFT TISSUES - 1+ VIEW COMPARISON:  None. FINDINGS: Prevertebral soft tissues have a normal appearance. There is mild degenerative change of the mid cervical spine, associated with loss of lordosis. Note is made of carotid calcifications. Lung apices are clear. IMPRESSION: Normal appearance are prevertebral soft tissues. Electronically Signed   By: Nolon Nations M.D.   On: 03/08/2016 15:29    Procedures Procedures (including critical care time)  Medications Ordered in ED Medications  glucagon (human recombinant) (GLUCAGEN) injection 1  mg (1 mg Intravenous Given 03/08/16 1511)     Initial Impression / Assessment and Plan / ED Course  I have reviewed the triage vital signs and the nursing notes.  Pertinent labs & imaging results that were available during my care of the patient were reviewed by me and considered in my medical decision making (see chart for details).  Clinical Course   Patient presents to ED with foreign body sensation in throat after swallowing chicken wing on Wednesday. X-ray of neck revealed no opaque obstruction. Patient was given 1 mg IV glucagon and diet Coke without any relief. Patient threw diet Coke backup. Unable to swallow his secretions. Patient is hemodynamically stable this time. He is not hypoxic or tachypneic. Patient is in no acute distress sitting on bed. I spoke with [facility] PA who agrees to scope patient this afternoon with discharge pending. Care hand off to PA [facility] with d/c pending GI findings.  Anticipating discharge home with symptomatically treatment and follow-up in GI office outpatient.   Final Clinical Impressions(s) / ED Diagnoses   Final diagnoses:  Foreign body sensation in throat    New Prescriptions New Prescriptions   No medications on file     Doristine Devoid, PA-C 03/08/16 [place], MD 03/08/16 [phone]

## 2016-03-08 NOTE — Anesthesia Postprocedure Evaluation (Signed)
Anesthesia Post Note  Patient: Gerald [name]  Procedure(s) Performed: Procedure(s) (LRB): ESOPHAGOGASTRODUODENOSCOPY (EGD) WITH PROPOFOL (N/A)  Patient location during evaluation: PACU Anesthesia Type: MAC Level of consciousness: awake and alert Pain management: pain level controlled Vital Signs Assessment: post-procedure vital signs reviewed and stable Respiratory status: spontaneous breathing Cardiovascular status: stable Anesthetic complications: no    Last Vitals:  Vitals:   03/08/16 1715 03/08/16 1720  BP: 94/60 (!) 119/50  Pulse: 79 81  Resp: 17 17  Temp: 36.8 C     Last Pain:  Vitals:   03/08/16 1715  TempSrc: Oral                 Nolon Nations

## 2016-03-08 NOTE — Discharge Instructions (Signed)

## 2016-03-08 NOTE — Anesthesia Preprocedure Evaluation (Addendum)
Anesthesia Evaluation  Patient identified by MRN, date of birth, ID band Patient awake    Reviewed: Allergy & Precautions, NPO status , Patient's Chart, lab work & pertinent test results  Airway Mallampati: II  TM Distance: >3 FB Neck ROM: Full    Dental no notable dental hx.    Pulmonary neg pulmonary ROS, former smoker,    Pulmonary exam normal breath sounds clear to auscultation       Cardiovascular hypertension, Normal cardiovascular exam Rhythm:Regular Rate:Normal     Neuro/Psych negative neurological ROS  negative psych ROS   GI/Hepatic Neg liver ROS, PUD,   Endo/Other  negative endocrine ROSdiabetes  Renal/GU negative Renal ROS     Musculoskeletal  (+) Arthritis ,   Abdominal   Peds  Hematology negative hematology ROS (+) anemia ,   Anesthesia Other Findings   Reproductive/Obstetrics negative OB ROS                             Anesthesia Physical Anesthesia Plan  ASA: II  Anesthesia Plan: MAC   Post-op Pain Management:    Induction: Intravenous  Airway Management Planned:   Additional Equipment:   Intra-op Plan:   Post-operative Plan: Extubation in OR  Informed Consent: I have reviewed the patients History and Physical, chart, labs and discussed the procedure including the risks, benefits and alternatives for the proposed anesthesia with the patient or authorized representative who has indicated his/her understanding and acceptance.   Dental advisory given  Plan Discussed with: CRNA  Anesthesia Plan Comments: (Discussed with Dr. Hilarie Fredrickson. Possible conversion to GA if food impaction and particulate matter in esophagus )       Anesthesia Quick Evaluation

## 2016-03-08 NOTE — Consult Note (Signed)
Consultation  Referring Provider: Ocie Cornfield, PA-C Primary Care Physician:  Odette Fraction, MD Primary Gastroenterologist:  Dr. Deatra Ina  Reason for Consultation:  Food impaction  HPI: Gerald [name] is a 60 y.o. male with past medical history of upper GI bleed, gastric ulcers, diabetes, hyperlipidemia, who presents to the emergency department with dysphagia. He reports symptoms started fairly acutely on Tuesday night, 3 days ago, after eating a chicken wing. Says this time he's had trouble eating solids, liquids and handling his secretions. He tried to vomit up the food he ate but was unable to. He's had no chest pain or abdominal pain. No nausea or vomiting. No melena or rectal bleeding. No recent heartburn and he states he's had no heartburn in the last 5 years since he quit smoking. He drinks alcohol on the weekends as much as a 12 pack per day.    Past Medical History:  Diagnosis Date  . Abnormal LFTs (liver function tests) 12/2012  . Anal condyloma   . Anemia 07/2010  . Arthritis   . Diabetes mellitus   . Gastric ulcer 07/2010   bleeding GU, clipped and injected with epi. non-bleeding ulcer at [place]. biopsy negative for H pylori.  on follow up EGD 08/2010, ulcers totally heled  . Hyperlipemia   . Unspecified essential hypertension   . Upper GI bleed     Past Surgical History:  Procedure Laterality Date  . COLONOSCOPY  10/2010   sessile polyp (path: tubular adenoma) in tranverse colon. Dr Deatra Ina  . ESOPHAGOGASTRODUODENOSCOPY  07/24/2010   for hematemesis.  bleeding GU with VV, injected with epi and endoclipped.  second, non-bleeding ulcer at [facility]. biopsies negative for h pylori or dysplasia  . ESOPHAGOGASTRODUODENOSCOPY  08/2010   to assess the gastric ulcer.  study was normal.  no residual ulcers.  Dr Deatra Ina  . GANGLION CYST EXCISION      Prior to Admission medications   Medication Sig Start Date End Date Taking? Authorizing Provider  aspirin EC 81 MG tablet  Take 81 mg by mouth at bedtime.   Yes Historical Provider, MD  empagliflozin (JARDIANCE) 25 MG TABS tablet Take 25 mg by mouth daily. 09/08/15  Yes Susy Frizzle, MD  glipiZIDE (GLUCOTROL XL) 10 MG 24 hr tablet TAKE 1 TABLET DAILY 12/13/15  Yes Susy Frizzle, MD  hydrochlorothiazide (HYDRODIURIL) 25 MG tablet TAKE 1 TABLET DAILY 02/05/16  Yes Susy Frizzle, MD  losartan (COZAAR) 100 MG tablet TAKE 1 TABLET DAILY 02/05/16  Yes Susy Frizzle, MD  metFORMIN (GLUCOPHAGE) 1000 MG tablet TAKE 1 TABLET TWICE A DAY 09/28/15  Yes Susy Frizzle, MD  Misc Natural Products (TRIPLE FLEX) CAPS Take 1 capsule by mouth.   Yes Historical Provider, MD  montelukast (SINGULAIR) 10 MG tablet TAKE 1 TABLET AT BEDTIME 08/14/15  Yes Susy Frizzle, MD  Multiple Vitamin (MULTIVITAMIN PO) Take by mouth.     Yes Historical Provider, MD  naproxen sodium (ANAPROX) 220 MG tablet Take 440 mg by mouth 2 (two) times daily with a meal.   Yes Historical Provider, MD  pravastatin (PRAVACHOL) 20 MG tablet TAKE 1 TABLET DAILY 01/12/16  Yes Susy Frizzle, MD  mometasone (ELOCON) 0.1 % ointment Apply topically daily. Patient not taking: Reported on 03/08/2016 07/25/15   Susy Frizzle, MD  sildenafil (VIAGRA) 100 MG tablet Take 0.5-1 tablets (50-100 mg total) by mouth daily as needed for erectile dysfunction. 06/23/15   Susy Frizzle, MD  sitaGLIPtin (  JANUVIA) 100 MG tablet Take 1 tablet (100 mg total) by mouth daily. Patient not taking: Reported on 03/08/2016 09/08/15   Susy Frizzle, MD    No current facility-administered medications for this encounter.     Allergies as of 03/08/2016  . (No Known Allergies)    Family History  Problem Relation Age of Onset  . Diabetes Brother   . Colon cancer Neg Hx     Social History   Social History  . Marital status: Married    Spouse name: N/A  . Number of children: N/A  . Years of education: N/A   Occupational History  . Welder  [employer]   Social History  Main Topics  . Smoking status: Former Smoker    Quit date: 07/24/2010  . Smokeless tobacco: Never Used  . Alcohol use No  . Drug use: No  . Sexual activity: Not on file   Other Topics Concern  . Not on file   Social History Narrative  . No narrative on file    Review of Systems: Gen: Denies any fever, chills, sweats, anorexia, fatigue, weakness, malaise, weight loss, and sleep disorder CV: Denies chest pain, angina, palpitations, syncope, orthopnea, PND, peripheral edema, and claudication. Resp: Denies dyspnea at rest, dyspnea with exercise, cough, sputum, wheezing, coughing up blood, and pleurisy. GI: see HPI GU : Denies urinary burning, blood in urine, urinary frequency, urinary hesitancy, nocturnal urination, and urinary incontinence. MS: Denies joint pain, limitation of movement, and swelling, stiffness, low back pain, extremity pain. Denies muscle weakness, cramps, atrophy.  Derm: Denies rash, itching, dry skin, hives, moles, warts, or unhealing ulcers.  Psych: Denies depression, anxiety, memory loss, suicidal ideation, hallucinations, paranoia, and confusion. Heme: Denies bruising, bleeding, and enlarged lymph nodes. Neuro:  Denies any headaches, dizziness, paresthesias. Endo:  Denies any problems with DM, thyroid, adrenal function.  Physical Exam: Vital signs in last 24 hours: Temp:  [97.8 F (36.6 C)-98.1 F (36.7 C)] 98.1 F (36.7 C) (11/17 1607) Pulse Rate:  [83-88] 83 (11/17 1607) Resp:  [18-21] 21 (11/17 1607) BP: (141-156)/(83-90) 141/83 (11/17 1607) SpO2:  [94 %-96 %] 96 % (11/17 1607) Weight:  [230 lb (104.3 kg)] 230 lb (104.3 kg) (11/17 1607)   Gen: awake, alert, NAD HEENT: anicteric, op clear CV: RRR, no mrg Pulm: CTA b/l Abd: soft, NT/ND, +BS throughout Ext: no c/c/e Neuro: nonfocal   Intake/Output from previous day: No intake/output data recorded. Intake/Output this shift: No intake/output data recorded.  Studies/Results: Neck xray --  unremarkable   IMPRESSION:  60 year old with esophageal food impaction over the last 72 hours  PLAN: 1. Esophageal food impaction -- plan for upper endoscopy with monitored anesthesia care for expected esophageal disimpaction. Procedure higher risk than average due to prolonged period of food impaction. I discussed this with both he and his wife.The nature of the procedure, as well as the risks, benefits, and alternatives were carefully and thoroughly reviewed with the patient. Ample time for discussion and questions allowed. The patient understood, was satisfied, and agreed to proceed.      Charlynn Salih, [place]  03/08/2016, 4:20 PM

## 2016-03-08 NOTE — ED Notes (Addendum)
Pt reports eating spicy chicken wings on Wednesday, and since then he can't swallow anymore. He sts it feels like something is stuck in his throat, and he can't even pass any liquids down. His wife however reports that in the last 6 months he is having increased difficulty swallowing. Pt denies pain at this time.

## 2016-03-11 ENCOUNTER — Encounter (HOSPITAL_COMMUNITY): Payer: Self-pay | Admitting: Internal Medicine

## 2016-03-11 LAB — GLUCOSE, CAPILLARY: GLUCOSE-CAPILLARY: 119 mg/dL — AB (ref 65–99)

## 2016-03-12 ENCOUNTER — Other Ambulatory Visit: Payer: Self-pay | Admitting: Family Medicine

## 2016-03-26 ENCOUNTER — Other Ambulatory Visit: Payer: Self-pay | Admitting: Family Medicine

## 2016-05-04 ENCOUNTER — Other Ambulatory Visit: Payer: Self-pay | Admitting: Family Medicine

## 2016-07-05 ENCOUNTER — Telehealth: Payer: Self-pay | Admitting: *Deleted

## 2016-07-05 ENCOUNTER — Other Ambulatory Visit: Payer: Self-pay | Admitting: Internal Medicine

## 2016-07-05 NOTE — Telephone Encounter (Signed)
Yes, would repeat if continued symptoms, uncontrolled heartburn, dysphagia, odynophagia, nausea and/or vomiting

## 2016-07-05 NOTE — Telephone Encounter (Signed)
Dr Hilarie Fredrickson- Please review patient's last endoscopy. Looks like he was supposed to have repeat endoscopy in 4-6 weeks for possible dilation? I dont see where he was ever scheduled or placed for recall. Was this repeat endoscopy going to be based on whether he had continued dysphagia?

## 2016-07-10 NOTE — Telephone Encounter (Signed)
Left voicemail for patient to call back. 

## 2016-07-11 NOTE — Telephone Encounter (Signed)
Left voicemail for patient to call back. 

## 2016-07-12 MED ORDER — PANTOPRAZOLE SODIUM 40 MG PO TBEC
40.0000 mg | DELAYED_RELEASE_TABLET | Freq: Every day | ORAL | 0 refills | Status: DC
Start: 2016-07-12 — End: 2016-09-21

## 2016-07-12 NOTE — Telephone Encounter (Signed)
Patient states that he is doing well with no reflux symptoms as long as he is taking his PPI. He states that if he does have heartburn, it may be once every 4-5 months. I have gone ahead and refilled his pantoprazole.

## 2016-08-09 ENCOUNTER — Other Ambulatory Visit: Payer: Self-pay | Admitting: Family Medicine

## 2016-08-09 NOTE — Telephone Encounter (Signed)
Ok to refill 

## 2016-08-09 NOTE — Telephone Encounter (Signed)
ok 

## 2016-08-30 ENCOUNTER — Other Ambulatory Visit: Payer: Self-pay | Admitting: Family Medicine

## 2016-09-08 ENCOUNTER — Other Ambulatory Visit: Payer: Self-pay | Admitting: Family Medicine

## 2016-09-21 ENCOUNTER — Other Ambulatory Visit: Payer: Self-pay | Admitting: Internal Medicine

## 2016-09-22 ENCOUNTER — Other Ambulatory Visit: Payer: Self-pay | Admitting: Family Medicine

## 2016-10-08 ENCOUNTER — Other Ambulatory Visit: Payer: Self-pay | Admitting: Family Medicine

## 2016-11-01 ENCOUNTER — Other Ambulatory Visit: Payer: Self-pay | Admitting: Family Medicine

## 2016-12-04 ENCOUNTER — Other Ambulatory Visit: Payer: Self-pay | Admitting: Internal Medicine

## 2016-12-07 ENCOUNTER — Other Ambulatory Visit: Payer: Self-pay | Admitting: Family Medicine

## 2017-01-29 ENCOUNTER — Other Ambulatory Visit: Payer: Self-pay | Admitting: Family Medicine

## 2017-01-31 ENCOUNTER — Encounter: Payer: BLUE CROSS/BLUE SHIELD | Admitting: Family Medicine

## 2017-02-06 ENCOUNTER — Ambulatory Visit (INDEPENDENT_AMBULATORY_CARE_PROVIDER_SITE_OTHER): Payer: BLUE CROSS/BLUE SHIELD | Admitting: Family Medicine

## 2017-02-06 VITALS — BP 154/80 | HR 65 | Temp 97.7°F | Resp 18 | Wt 230.0 lb

## 2017-02-06 DIAGNOSIS — Z0000 Encounter for general adult medical examination without abnormal findings: Secondary | ICD-10-CM | POA: Diagnosis not present

## 2017-02-06 DIAGNOSIS — E7800 Pure hypercholesterolemia, unspecified: Secondary | ICD-10-CM | POA: Diagnosis not present

## 2017-02-06 DIAGNOSIS — I10 Essential (primary) hypertension: Secondary | ICD-10-CM | POA: Diagnosis not present

## 2017-02-06 DIAGNOSIS — Z125 Encounter for screening for malignant neoplasm of prostate: Secondary | ICD-10-CM | POA: Diagnosis not present

## 2017-02-06 DIAGNOSIS — Z1211 Encounter for screening for malignant neoplasm of colon: Secondary | ICD-10-CM | POA: Diagnosis not present

## 2017-02-06 DIAGNOSIS — Z23 Encounter for immunization: Secondary | ICD-10-CM

## 2017-02-06 DIAGNOSIS — E119 Type 2 diabetes mellitus without complications: Secondary | ICD-10-CM

## 2017-02-06 NOTE — Addendum Note (Signed)
Addended by: Shary Decamp B on: 02/06/2017 08:32 AM   Modules accepted: Orders

## 2017-02-06 NOTE — Progress Notes (Signed)
Subjective:    Patient ID: Gerald [name], male    DOB: June 10, 1955, 61 y.o.   MRN: [id number]  HPI  He patient I a very pleasant 61-ar-old white  Who is here today for complete physical exam. Pneumovax 23 is up-to-date. He is due for a flu shot. He is due for HIV screening but he declines that. He is a diabetic and therefore he is due for an A1c, diabetic foot exam which was performed today and is normal, and a diabetic eye exam. His last colonoscopy was 2012 and he is due again. On the skin near the lateral epicondyles is a 1 cm scaly red patch with raised rolled borders concerning for possible skin malignancy versus actinic keratosis. He would like this treated today. His blood pressure is elevated but he chews cyst white coat syndrome. He states that his blood pressure at home has been averaging in the 130s over 80s. He is checking his blood sugar and he states the majority of his blood sugars are 120-130. He denies any hypoglycemia. He denies any chest pain shortness of breath or dyspnea on exertion. He denies any myalgias or right upper quadrant pain. He denies any neuropathy in his feet  Past Medical History:  Diagnosis Date  . Abnormal LFTs (liver function tests) 12/2012  . Anal condyloma   . Anemia 07/2010  . Arthritis   . Diabetes mellitus   . Gastric ulcer 07/2010   bleeding GU, clipped and injected with epi. non-bleeding ulcer at [facility]. biopsy negative for H pylori.  on follow up EGD 08/2010, ulcers totally heled  . Hyperlipemia   . Unspecified essential hypertension   . Upper GI bleed    Past Surgical History:  Procedure Laterality Date  . COLONOSCOPY  10/2010   sessile polyp (path: tubular adenoma) in tranverse colon. Dr Deatra Ina  . ESOPHAGOGASTRODUODENOSCOPY  07/24/2010   for hematemesis.  bleeding GU with VV, injected with epi and endoclipped.  second, non-bleeding ulcer at [facility]. biopsies negative for h pylori or dysplasia  . ESOPHAGOGASTRODUODENOSCOPY  08/2010   to  assess the gastric ulcer.  study was normal.  no residual ulcers.  Dr Deatra Ina  . ESOPHAGOGASTRODUODENOSCOPY (EGD) WITH PROPOFOL N/A 03/08/2016   Procedure: ESOPHAGOGASTRODUODENOSCOPY (EGD) WITH PROPOFOL;  Surgeon: Jerene Bears, MD;  Location: [facility];  Service: Gastroenterology;  Laterality: N/A;  . GANGLION CYST EXCISION     Current Outpatient Prescriptions on File Prior to Visit  Medication Sig Dispense Refill  . aspirin EC 81 MG tablet Take 81 mg by mouth at bedtime.    Marland Kitchen glipiZIDE (GLUCOTROL XL) 10 MG 24 hr tablet TAKE 1 TABLET DAILY 90 tablet 0  . hydrochlorothiazide (HYDRODIURIL) 25 MG tablet TAKE 1 TABLET DAILY 90 tablet 2  . JANUVIA 100 MG tablet TAKE 1 TABLET DAILY 90 tablet 3  . JARDIANCE 25 MG TABS tablet TAKE 1 TABLET DAILY 90 tablet 3  . losartan (COZAAR) 100 MG tablet TAKE 1 TABLET DAILY 90 tablet 2  . metFORMIN (GLUCOPHAGE) 1000 MG tablet TAKE 1 TABLET TWICE A DAY 180 tablet 1  . Misc Natural Products (TRIPLE FLEX) CAPS Take 1 capsule by mouth.    . montelukast (SINGULAIR) 10 MG tablet TAKE 1 TABLET AT BEDTIME 90 tablet 2  . Multiple Vitamin (MULTIVITAMIN PO) Take by mouth.      . naproxen sodium (ANAPROX) 220 MG tablet Take 440 mg by mouth 2 (two) times daily with a meal.    . pravastatin (PRAVACHOL)  20 MG tablet TAKE 1 TABLET DAILY 90 tablet 2  . sildenafil (VIAGRA) 100 MG tablet TAKE ONE-HALF (1/2) TO ONE TABLET DAILY AS NEEDED FOR ERECTILE DYSFUNCTION 10 tablet 11   No current facility-administered medications on file prior to visit.    No Known Allergies Social History   Social History  . Marital status: Married    Spouse name: N/A  . Number of children: N/A  . Years of education: N/A   Occupational History  . Welder  [employer]   Social History Main Topics  . Smoking status: Former Smoker    Quit date: 07/24/2010  . Smokeless tobacco: Never Used  . Alcohol use No  . Drug use: No  . Sexual activity: Not on file   Other Topics Concern  . Not on file     Social History Narrative  . No narrative on file   Family History  Problem Relation Age of Onset  . Diabetes Brother   . Colon cancer Neg Hx       Review of Systems  All other systems reviewed and are negative.      Objective:   Physical Exam  Constitutional: He is oriented to person, place, and time. He appears well-developed and well-nourished. No distress.  HENT:  Head: Normocephalic and atraumatic.  Right Ear: External ear normal.  Left Ear: External ear normal.  Nose: Nose normal.  Mouth/Throat: Oropharynx is clear and moist. No oropharyngeal exudate.  Eyes: Pupils are equal, round, and reactive to light. Conjunctivae and EOM are normal. Right eye exhibits no discharge. Left eye exhibits no discharge. No scleral icterus.  Neck: Normal range of motion. Neck supple. No JVD present. No tracheal deviation present. No thyromegaly present.  Cardiovascular: Normal rate, regular rhythm, normal heart sounds and intact distal pulses.  Exam reveals no gallop and no friction rub.   No murmur heard. Pulmonary/Chest: Effort normal and breath sounds normal. No stridor. No respiratory distress. He has no wheezes. He has no rales. He exhibits no tenderness.  Abdominal: Soft. Bowel sounds are normal. He exhibits no distension and no mass. There is no tenderness. There is no rebound and no guarding.  Musculoskeletal: Normal range of motion. He exhibits no edema, tenderness or deformity.  Lymphadenopathy:    He has no cervical adenopathy.  Neurological: He is alert and oriented to person, place, and time. He has normal reflexes. He displays normal reflexes. No cranial nerve deficit. He exhibits normal muscle tone. Coordination normal.  Skin: Skin is warm. Rash noted. He is not diaphoretic. No erythema. No pallor.  Psychiatric: He has a normal mood and affect. His behavior is normal. Judgment and thought content normal.  Vitals reviewed.         Assessment & Plan:  Routine general  medical examination at a health care facility - Plan: CBC with Differential/Platelet, COMPLETE METABOLIC PANEL WITH GFR, Lipid panel, PSA  Controlled type 2 diabetes mellitus without complication, without long-term current use of insulin (HCC) - Plan: CBC with Differential/Platelet, COMPLETE METABOLIC PANEL WITH GFR, Lipid panel, Microalbumin, urine, Hemoglobin A1c  Essential hypertension - Plan: CBC with Differential/Platelet, COMPLETE METABOLIC PANEL WITH GFR, Lipid panel, Microalbumin, urine  Pure hypercholesterolemia  Prostate cancer screening - Plan: PSA  Colon cancer screening - Plan: Ambulatory referral to Gastroenterology  The lesion on the skin near the lateral eicondyles on his left arm was treated with liquid nitrogen cryotherapy for 30 seconds.   If it persists, he will need excisional biopsy.  He received  the flu shot today. I will check a hemoglobin A1c. Goal hemoglobin A1c is less than 6.5. I will check an LDL cholesterol. Goal LDL cholesterol is less than 100. I will schedule the patient for colonoscopy. He has an eye doctor and I have asked him to call his eye doctor schedule an appointment at his convenience. I will check a urine microalbumin. I will screen for prostate cancer with PSA. He declines HIV screening. His blood pressure today is elevated. He attributes this to white coat syndrome.He would like to check his blood pressure at home on a daily basis and call me with the values in one to 2 weeks. Regular anticipatory guidance is provided.

## 2017-02-07 ENCOUNTER — Encounter: Payer: Self-pay | Admitting: Internal Medicine

## 2017-02-07 LAB — HEMOGLOBIN A1C
EAG (MMOL/L): 8.2 (calc)
HEMOGLOBIN A1C: 6.8 %{Hb} — AB (ref <5.7)
Mean Plasma Glucose: 148 (calc)

## 2017-02-07 LAB — MICROALBUMIN, URINE: Microalb, Ur: 0.5 mg/dL

## 2017-02-07 LAB — COMPLETE METABOLIC PANEL WITH GFR
AG Ratio: 1.4 (calc) (ref 1.0–2.5)
ALBUMIN MSPROF: 4.7 g/dL (ref 3.6–5.1)
ALKALINE PHOSPHATASE (APISO): 47 U/L (ref 40–115)
ALT: 39 U/L (ref 9–46)
AST: 50 U/L — ABNORMAL HIGH (ref 10–35)
BILIRUBIN TOTAL: 0.8 mg/dL (ref 0.2–1.2)
BUN: 14 mg/dL (ref 7–25)
CO2: 23 mmol/L (ref 20–32)
Calcium: 9.6 mg/dL (ref 8.6–10.3)
Chloride: 99 mmol/L (ref 98–110)
Creat: 0.85 mg/dL (ref 0.70–1.25)
GFR, EST AFRICAN AMERICAN: 109 mL/min/{1.73_m2} (ref >=60)
GFR, Est Non African American: 94 mL/min/{1.73_m2} (ref >=60)
GLOBULIN: 3.3 g/dL (ref 1.9–3.7)
Glucose, Bld: 115 mg/dL — ABNORMAL HIGH (ref 65–99)
Potassium: 4.3 mmol/L (ref 3.5–5.3)
SODIUM: 136 mmol/L (ref 135–146)
TOTAL PROTEIN: 8 g/dL (ref 6.1–8.1)

## 2017-02-07 LAB — CBC WITH DIFFERENTIAL/PLATELET
BASOS ABS: 146 {cells}/uL (ref 0–200)
Basophils Relative: 1.7 %
EOS ABS: 103 {cells}/uL (ref 15–500)
Eosinophils Relative: 1.2 %
HEMATOCRIT: 48.4 % (ref 38.5–50.0)
HEMOGLOBIN: 16.5 g/dL (ref 13.2–17.1)
LYMPHS ABS: 1565 {cells}/uL (ref 850–3900)
MCH: 29.1 pg (ref 27.0–33.0)
MCHC: 34.1 g/dL (ref 32.0–36.0)
MCV: 85.4 fL (ref 80.0–100.0)
MPV: 10.4 fL (ref 7.5–12.5)
Monocytes Relative: 7.5 %
NEUTROS ABS: 6140 {cells}/uL (ref 1500–7800)
Neutrophils Relative %: 71.4 %
Platelets: 211 10*3/uL (ref 140–400)
RBC: 5.67 10*6/uL (ref 4.20–5.80)
RDW: 13.6 % (ref 11.0–15.0)
Total Lymphocyte: 18.2 %
WBC mixed population: 645 cells/uL (ref 200–950)
WBC: 8.6 10*3/uL (ref 3.8–10.8)

## 2017-02-07 LAB — LIPID PANEL
CHOL/HDL RATIO: 3.8 (calc) (ref <5.0)
CHOLESTEROL: 152 mg/dL (ref <200)
HDL: 40 mg/dL — ABNORMAL LOW (ref >40)
LDL Cholesterol (Calc): 87 mg/dL (calc)
NON-HDL CHOLESTEROL (CALC): 112 mg/dL (ref <130)
Triglycerides: 152 mg/dL — ABNORMAL HIGH (ref <150)

## 2017-02-07 LAB — PSA: PSA: 0.2 ng/mL (ref <=4.0)

## 2017-03-05 ENCOUNTER — Other Ambulatory Visit: Payer: Self-pay | Admitting: Internal Medicine

## 2017-03-08 ENCOUNTER — Other Ambulatory Visit: Payer: Self-pay | Admitting: Family Medicine

## 2017-03-19 ENCOUNTER — Ambulatory Visit (AMBULATORY_SURGERY_CENTER): Payer: Self-pay | Admitting: *Deleted

## 2017-03-19 ENCOUNTER — Other Ambulatory Visit: Payer: Self-pay

## 2017-03-19 VITALS — Ht 72.0 in | Wt 235.0 lb

## 2017-03-19 DIAGNOSIS — Z86010 Personal history of colonic polyps: Secondary | ICD-10-CM

## 2017-03-19 NOTE — Progress Notes (Signed)
No egg or soy allergy known to patient  No issues with past sedation with any surgeries  or procedures, no intubation problems  No diet pills per patient No home 02 use per patient  No blood thinners per patient  Pt denies issues with constipation  No A fib or A flutter  EMMI video sent to pt's e mail pt declined   

## 2017-03-22 ENCOUNTER — Other Ambulatory Visit: Payer: Self-pay | Admitting: Family Medicine

## 2017-04-09 ENCOUNTER — Other Ambulatory Visit: Payer: Self-pay

## 2017-04-09 ENCOUNTER — Encounter: Payer: Self-pay | Admitting: Internal Medicine

## 2017-04-09 ENCOUNTER — Ambulatory Visit (AMBULATORY_SURGERY_CENTER): Payer: BLUE CROSS/BLUE SHIELD | Admitting: Internal Medicine

## 2017-04-09 VITALS — BP 116/75 | HR 86 | Temp 98.6°F | Resp 18 | Ht 72.0 in | Wt 235.0 lb

## 2017-04-09 DIAGNOSIS — D124 Benign neoplasm of descending colon: Secondary | ICD-10-CM | POA: Diagnosis not present

## 2017-04-09 DIAGNOSIS — D123 Benign neoplasm of transverse colon: Secondary | ICD-10-CM

## 2017-04-09 DIAGNOSIS — Z86010 Personal history of colonic polyps: Secondary | ICD-10-CM | POA: Diagnosis present

## 2017-04-09 MED ORDER — SODIUM CHLORIDE 0.9 % IV SOLN: 500.0000 mL | Freq: Once | INTRAVENOUS | Status: DC

## 2017-04-09 NOTE — Op Note (Signed)
[facility] Patient Name: Gerald [name] Procedure Date: 04/09/2017 8:44 AM MRN: [id number] Endoscopist: Jerene Bears , MD Age: 61 Referring MD:  Date of Birth: 1955/05/25 Gender: Male Account #: [id number] Procedure:                Colonoscopy Indications:              High risk colon cancer surveillance: Personal                            history of non-advanced adenoma, Last colonoscopy:                            2012 Medicines:                Monitored Anesthesia Care Procedure:                Pre-Anesthesia Assessment:                           - Prior to the procedure, a History and Physical                            was performed, and patient medications and                            allergies were reviewed. The patient's tolerance of                            previous anesthesia was also reviewed. The risks                            and benefits of the procedure and the sedation                            options and risks were discussed with the patient.                            All questions were answered, and informed consent                            was obtained. Prior Anticoagulants: The patient has                            taken no previous anticoagulant or antiplatelet                            agents. ASA Grade Assessment: II - A patient with                            mild systemic disease. After reviewing the risks                            and benefits, the patient was deemed in  satisfactory condition to undergo the procedure.                           After obtaining informed consent, the colonoscope                            was passed under direct vision. Throughout the                            procedure, the patient's blood pressure, [name], and                            oxygen saturations were monitored continuously. The                            Colonoscope was introduced through the anus and                    advanced to the the cecum, identified by                            appendiceal orifice and ileocecal valve. The                            colonoscopy was performed without difficulty. The                            patient tolerated the procedure well. The quality                            of the bowel preparation was good. The ileocecal                            valve, appendiceal orifice, and rectum were                            photographed. Scope In: 8:55:54 AM Scope Out: 9:10:01 AM Scope Withdrawal Time: 0 hours 11 minutes 31 seconds  Total Procedure Duration: 0 hours 14 minutes 7 seconds  Findings:                 The digital rectal exam was normal.                           Four sessile polyps were found in the transverse                            colon. The polyps were 4 to 6 mm in size. These                            polyps were removed with a cold snare. Resection                            and retrieval were complete.  Two sessile polyps were found in the descending                            colon. The polyps were 4 to 7 mm in size. These                            polyps were removed with a cold snare. Resection                            and retrieval were complete.                           Internal hemorrhoids were found during                            retroflexion. The hemorrhoids were small. Complications:            No immediate complications. Estimated Blood Loss:     Estimated blood loss was minimal. Impression:               - Four 4 to 6 mm polyps in the transverse colon,                            removed with a cold snare. Resected and retrieved.                           - Two 4 to 7 mm polyps in the descending colon,                            removed with a cold snare. Resected and retrieved.                           - Small internal hemorrhoids. Recommendation:           - Patient has a contact number  available for                            emergencies. The signs and symptoms of potential                            delayed complications were discussed with the                            patient. Return to normal activities tomorrow.                            Written discharge instructions were provided to the                            patient.                           - Resume previous diet.                           -  Continue present medications.                           - Await pathology results.                           - Repeat colonoscopy is recommended for                            surveillance. The colonoscopy date will be                            determined after pathology results from today's                            exam become available for review. Jerene Bears, MD 04/09/2017 9:13:03 AM This report has been signed electronically.

## 2017-04-09 NOTE — Progress Notes (Signed)
Called to room to assist during endoscopic procedure.  Patient ID and intended procedure confirmed with present staff. Received instructions for my participation in the procedure from the performing physician.  

## 2017-04-09 NOTE — Progress Notes (Signed)
  [facility] Post-op Note  Patient: LAKEITH CAREAGA  Procedure(s) Performed: colonoscopy  Patient Location: [facility] - Recovery Area  Anesthesia Type: Deep Sedation/Propofol  Level of Consciousness: awake, oriented and patient cooperative  Airway and Oxygen Therapy: Patient Spontanous Breathing  Post-op Pain: none  Post-op Assessment:  Post-op Vital signs reviewed, Patient's Cardiovascular Status Stable, Respiratory Function Stable, Patent Airway, No signs of Nausea or vomiting and Pain level controlled  Post-op Vital Signs: Reviewed and stable  Complications: No apparent anesthesia complications  Jashad Depaula E Burnie Therien 9:17 AM

## 2017-04-09 NOTE — Progress Notes (Signed)
No changes in medical or surgical hx since PV per pt 

## 2017-04-09 NOTE — Patient Instructions (Signed)
YOU HAD AN ENDOSCOPIC PROCEDURE TODAY AT [facility]:   Refer to the procedure report that was given to you for any specific questions about what was found during the examination.  If the procedure report does not answer your questions, please call your gastroenterologist to clarify.  If you requested that your care partner not be given the details of your procedure findings, then the procedure report has been included in a sealed envelope for you to review at your convenience later.  YOU SHOULD EXPECT: Some feelings of bloating in the abdomen. Passage of more gas than usual.  Walking can help get rid of the air that was put into your GI tract during the procedure and reduce the bloating. If you had a lower endoscopy (such as a colonoscopy or flexible sigmoidoscopy) you may notice spotting of blood in your stool or on the toilet paper. If you underwent a bowel prep for your procedure, you may not have a normal bowel movement for a few days.  Please Note:  You might notice some irritation and congestion in your nose or some drainage.  This is from the oxygen used during your procedure.  There is no need for concern and it should clear up in a day or so.  SYMPTOMS TO REPORT IMMEDIATELY:   Following lower endoscopy (colonoscopy or flexible sigmoidoscopy):  Excessive amounts of blood in the stool  Significant tenderness or worsening of abdominal pains  Swelling of the abdomen that is new, acute  Fever of 100F or higher   Black, tarry-looking stools  For urgent or emergent issues, a gastroenterologist can be reached at any hour by calling [phone].   DIET:  We do recommend a small meal at first, but then you may proceed to your regular diet.  Drink plenty of fluids but you should avoid alcoholic beverages for 24 hours.  ACTIVITY:  You should plan to take it easy for the rest of today and you should NOT DRIVE or use heavy machinery until tomorrow (because of the sedation  medicines used during the test).    FOLLOW UP: Our staff will call the number listed on your records the next business day following your procedure to check on you and address any questions or concerns that you may have regarding the information given to you following your procedure. If we do not reach you, we will leave a message.  However, if you are feeling well and you are not experiencing any problems, there is no need to return our call.  We will assume that you have returned to your regular daily activities without incident.  If any biopsies were taken you will be contacted by phone or by letter within the next 1-3 weeks.  Please call us at [phone] if you have not heard about the biopsies in 3 weeks.    SIGNATURES/CONFIDENTIALITY: You and/or your care partner have signed paperwork which will be entered into your electronic medical record.  These signatures attest to the fact that that the information above on your After Visit Summary has been reviewed and is understood.  Full responsibility of the confidentiality of this discharge information lies with you and/or your care-partner.  Polyp and diverticulosis information given.

## 2017-04-10 ENCOUNTER — Telehealth: Payer: Self-pay

## 2017-04-10 NOTE — Telephone Encounter (Signed)
  Follow up Call-  Call back number 04/09/2017  Post procedure Call Back phone  # [phone]  Permission to leave phone message Yes  Some recent data might be hidden     Patient questions:  Do you have a fever, pain , or abdominal swelling? No. Pain Score  0 *  Have you tolerated food without any problems? Yes.    Have you been able to return to your normal activities? Yes.    Do you have any questions about your discharge instructions: Diet   No. Medications  No. Follow up visit  No.  Do you have questions or concerns about your Care? No.  Actions: * If pain score is 4 or above: No action needed, pain <4.

## 2017-04-23 ENCOUNTER — Encounter: Payer: Self-pay | Admitting: Internal Medicine

## 2017-06-07 ENCOUNTER — Other Ambulatory Visit: Payer: Self-pay | Admitting: Family Medicine

## 2017-07-07 ENCOUNTER — Other Ambulatory Visit: Payer: Self-pay | Admitting: Family Medicine

## 2017-07-10 ENCOUNTER — Encounter (HOSPITAL_COMMUNITY): Payer: Self-pay | Admitting: Emergency Medicine

## 2017-07-10 ENCOUNTER — Emergency Department (HOSPITAL_COMMUNITY)
Admission: EM | Admit: 2017-07-10 | Discharge: 2017-07-10 | Disposition: A | Discharge status: Home / Self Care | Payer: BLUE CROSS/BLUE SHIELD | Attending: Emergency Medicine | Admitting: Emergency Medicine

## 2017-07-10 ENCOUNTER — Other Ambulatory Visit: Payer: Self-pay

## 2017-07-10 ENCOUNTER — Emergency Department (HOSPITAL_COMMUNITY): Payer: BLUE CROSS/BLUE SHIELD

## 2017-07-10 ENCOUNTER — Encounter (HOSPITAL_COMMUNITY)
Admission: EM | Disposition: A | Discharge status: Home / Self Care | Payer: Self-pay | Source: Home / Self Care | Attending: Emergency Medicine

## 2017-07-10 DIAGNOSIS — Z7982 Long term (current) use of aspirin: Secondary | ICD-10-CM | POA: Diagnosis not present

## 2017-07-10 DIAGNOSIS — Z7984 Long term (current) use of oral hypoglycemic drugs: Secondary | ICD-10-CM | POA: Insufficient documentation

## 2017-07-10 DIAGNOSIS — K2970 Gastritis, unspecified, without bleeding: Secondary | ICD-10-CM | POA: Insufficient documentation

## 2017-07-10 DIAGNOSIS — K259 Gastric ulcer, unspecified as acute or chronic, without hemorrhage or perforation: Secondary | ICD-10-CM | POA: Diagnosis not present

## 2017-07-10 DIAGNOSIS — K222 Esophageal obstruction: Secondary | ICD-10-CM | POA: Insufficient documentation

## 2017-07-10 DIAGNOSIS — Z87891 Personal history of nicotine dependence: Secondary | ICD-10-CM | POA: Diagnosis not present

## 2017-07-10 DIAGNOSIS — R221 Localized swelling, mass and lump, neck: Secondary | ICD-10-CM | POA: Diagnosis not present

## 2017-07-10 DIAGNOSIS — I10 Essential (primary) hypertension: Secondary | ICD-10-CM | POA: Insufficient documentation

## 2017-07-10 DIAGNOSIS — X58XXXA Exposure to other specified factors, initial encounter: Secondary | ICD-10-CM | POA: Diagnosis not present

## 2017-07-10 DIAGNOSIS — E119 Type 2 diabetes mellitus without complications: Secondary | ICD-10-CM | POA: Insufficient documentation

## 2017-07-10 DIAGNOSIS — T18128A Food in esophagus causing other injury, initial encounter: Secondary | ICD-10-CM | POA: Insufficient documentation

## 2017-07-10 DIAGNOSIS — Z79899 Other long term (current) drug therapy: Secondary | ICD-10-CM | POA: Insufficient documentation

## 2017-07-10 DIAGNOSIS — R1310 Dysphagia, unspecified: Secondary | ICD-10-CM | POA: Diagnosis not present

## 2017-07-10 DIAGNOSIS — K210 Gastro-esophageal reflux disease with esophagitis: Secondary | ICD-10-CM | POA: Insufficient documentation

## 2017-07-10 DIAGNOSIS — E785 Hyperlipidemia, unspecified: Secondary | ICD-10-CM | POA: Diagnosis not present

## 2017-07-10 DIAGNOSIS — Z791 Long term (current) use of non-steroidal anti-inflammatories (NSAID): Secondary | ICD-10-CM | POA: Insufficient documentation

## 2017-07-10 HISTORY — PX: ESOPHAGOGASTRODUODENOSCOPY: SHX5428

## 2017-07-10 SURGERY — EGD (ESOPHAGOGASTRODUODENOSCOPY)
Anesthesia: Moderate Sedation

## 2017-07-10 MED ORDER — BUTAMBEN-TETRACAINE-BENZOCAINE 2-2-14 % EX AERO
INHALATION_SPRAY | CUTANEOUS | Status: DC | PRN
  Administered 2017-07-10: 2 via TOPICAL

## 2017-07-10 MED ORDER — PANTOPRAZOLE SODIUM 40 MG PO TBEC: DELAYED_RELEASE_TABLET | ORAL | 0 refills | Status: DC

## 2017-07-10 MED ORDER — GLUCAGON HCL RDNA (DIAGNOSTIC) 1 MG IJ SOLR
1.0000 mg | Freq: Once | INTRAMUSCULAR | Status: AC
  Administered 2017-07-10: 1 mg via INTRAVENOUS
  Filled 2017-07-10: qty 1

## 2017-07-10 MED ORDER — FENTANYL CITRATE (PF) 100 MCG/2ML IJ SOLN
INTRAMUSCULAR | Status: AC
  Filled 2017-07-10: qty 4

## 2017-07-10 MED ORDER — FENTANYL CITRATE (PF) 100 MCG/2ML IJ SOLN
INTRAMUSCULAR | Status: DC | PRN
  Administered 2017-07-10 (×4): 25 ug via INTRAVENOUS

## 2017-07-10 MED ORDER — MIDAZOLAM HCL 5 MG/ML IJ SOLN
INTRAMUSCULAR | Status: AC
  Filled 2017-07-10: qty 2

## 2017-07-10 MED ORDER — MIDAZOLAM HCL 10 MG/2ML IJ SOLN
INTRAMUSCULAR | Status: DC | PRN
  Administered 2017-07-10 (×5): 2 mg via INTRAVENOUS

## 2017-07-10 NOTE — ED Notes (Signed)
Pt reports he is not able to get the fluids down

## 2017-07-10 NOTE — Consult Note (Signed)
Consultation  Referring Provider: Dr. Tomi Bamberger (ER MD) Primary Care Physician:  Susy Frizzle, MD Primary Gastroenterologist: Hilarie Fredrickson  Reason for Consultation:  Esophageal food impaction  HPI: Gerald [name] is a 62 y.o. male with history of distal esophageal stricture and food impaction in November 2017, history of colon polyps, diabetes, presenting with acute esophageal food impaction.  Started yesterday evening after eating chicken.  Denies intermittent dysphagia and reports really no trouble swallowing since the time of his last food impaction about 16 months ago.  Has been able to tolerate minimal liquids but no solids.  Foaming secretions and having to spit.  No abdominal pain.  No nausea or vomiting.  No chest pain or shortness of breath.  No neck pain.  Admits drinking alcohol 2 drinks a day more on weekends.  No recent blood in his stool or melena   Past Medical History:  Diagnosis Date  . Abnormal LFTs (liver function tests) 12/2012  . Allergy   . Anal condyloma   . Anemia 07/2010  . Diabetes mellitus   . Gastric ulcer 07/2010   bleeding GU, clipped and injected with epi. non-bleeding ulcer at [place]. biopsy negative for H pylori.  on follow up EGD 08/2010, ulcers totally heled  . Hyperlipemia   . Unspecified essential hypertension   . Upper GI bleed     Past Surgical History:  Procedure Laterality Date  . COLONOSCOPY  10/2010   sessile polyp (path: tubular adenoma) in tranverse colon. Dr Deatra Ina  . ESOPHAGOGASTRODUODENOSCOPY  07/24/2010   for hematemesis.  bleeding GU with VV, injected with epi and endoclipped.  second, non-bleeding ulcer at [facility]. biopsies negative for h pylori or dysplasia  . ESOPHAGOGASTRODUODENOSCOPY  08/2010   to assess the gastric ulcer.  study was normal.  no residual ulcers.  Dr Deatra Ina  . ESOPHAGOGASTRODUODENOSCOPY (EGD) WITH PROPOFOL N/A 03/08/2016   Procedure: ESOPHAGOGASTRODUODENOSCOPY (EGD) WITH PROPOFOL;  Surgeon: Jerene Bears, MD;   Location: [facility];  Service: Gastroenterology;  Laterality: N/A;  . GANGLION CYST EXCISION    . POLYPECTOMY    . UPPER GASTROINTESTINAL ENDOSCOPY      Prior to Admission medications   Medication Sig Start Date End Date Taking? Authorizing Provider  aspirin EC 81 MG tablet Take 81 mg by mouth at bedtime.   Yes [provider]  glipiZIDE (GLUCOTROL XL) 10 MG 24 hr tablet TAKE 1 TABLET DAILY Patient taking differently: TAKE 1  (10 mg) TABLET DAILY 06/09/17  Yes Susy Frizzle, MD  hydrochlorothiazide (HYDRODIURIL) 25 MG tablet TAKE 1 TABLET DAILY Patient taking differently: TAKE 1 TABLET (25 mg)  DAILY 11/01/16  Yes Susy Frizzle, MD  JANUVIA 100 MG tablet TAKE 1 TABLET DAILY 08/30/16  Yes Susy Frizzle, MD  JARDIANCE 25 MG TABS tablet TAKE 1 TABLET DAILY 08/30/16  Yes Susy Frizzle, MD  losartan (COZAAR) 100 MG tablet TAKE 1 TABLET DAILY 11/01/16  Yes Susy Frizzle, MD  metFORMIN (GLUCOPHAGE) 1000 MG tablet TAKE 1 TABLET TWICE A DAY 03/24/17  Yes Susy Frizzle, MD  Misc Natural Products (TRIPLE FLEX) CAPS Take 1 capsule by mouth.   Yes [provider]  montelukast (SINGULAIR) 10 MG tablet TAKE 1 TABLET AT BEDTIME 01/29/17  Yes Susy Frizzle, MD  Multiple Vitamin (MULTIVITAMIN PO) Take by mouth.     Yes [provider]  naproxen sodium (ANAPROX) 220 MG tablet Take 440 mg by mouth 2 (two) times daily with a meal.  Yes [provider]  pantoprazole (PROTONIX) 40 MG tablet TAKE 1 TABLET DAILY BEFORE BREAKFAST (NEED OFFICE VISIT FOR FURTHER REFILLS) 03/05/17  Yes Nara Paternoster, Lajuan Lines, MD  pravastatin (PRAVACHOL) 20 MG tablet TAKE 1 TABLET DAILY 07/07/17  Yes Susy Frizzle, MD  sildenafil (VIAGRA) 100 MG tablet TAKE ONE-HALF (1/2) TO ONE TABLET DAILY AS NEEDED FOR ERECTILE DYSFUNCTION 08/09/16  Yes Susy Frizzle, MD    Current Facility-Administered Medications  Medication Dose Route Frequency Provider Last Rate Last Dose  . 0.9 %   sodium chloride infusion  500 mL Intravenous Once Misheel Gowans, Lajuan Lines, MD       Current Outpatient Medications  Medication Sig Dispense Refill  . aspirin EC 81 MG tablet Take 81 mg by mouth at bedtime.    Marland Kitchen glipiZIDE (GLUCOTROL XL) 10 MG 24 hr tablet TAKE 1 TABLET DAILY (Patient taking differently: TAKE 1  (10 mg) TABLET DAILY) 90 tablet 0  . hydrochlorothiazide (HYDRODIURIL) 25 MG tablet TAKE 1 TABLET DAILY (Patient taking differently: TAKE 1 TABLET (25 mg)  DAILY) 90 tablet 2  . JANUVIA 100 MG tablet TAKE 1 TABLET DAILY 90 tablet 3  . JARDIANCE 25 MG TABS tablet TAKE 1 TABLET DAILY 90 tablet 3  . losartan (COZAAR) 100 MG tablet TAKE 1 TABLET DAILY 90 tablet 2  . metFORMIN (GLUCOPHAGE) 1000 MG tablet TAKE 1 TABLET TWICE A DAY 180 tablet 1  . Misc Natural Products (TRIPLE FLEX) CAPS Take 1 capsule by mouth.    . montelukast (SINGULAIR) 10 MG tablet TAKE 1 TABLET AT BEDTIME 90 tablet 2  . Multiple Vitamin (MULTIVITAMIN PO) Take by mouth.      . naproxen sodium (ANAPROX) 220 MG tablet Take 440 mg by mouth 2 (two) times daily with a meal.    . pantoprazole (PROTONIX) 40 MG tablet TAKE 1 TABLET DAILY BEFORE BREAKFAST (NEED OFFICE VISIT FOR FURTHER REFILLS) 90 tablet 0  . pravastatin (PRAVACHOL) 20 MG tablet TAKE 1 TABLET DAILY 90 tablet 2  . sildenafil (VIAGRA) 100 MG tablet TAKE ONE-HALF (1/2) TO ONE TABLET DAILY AS NEEDED FOR ERECTILE DYSFUNCTION 10 tablet 11    Allergies as of 07/10/2017  . (No Known Allergies)    Family History  Problem Relation Age of Onset  . Diabetes Brother   . Stomach cancer Mother   . Colon cancer Neg Hx   . Colon polyps Neg Hx   . Esophageal cancer Neg Hx   . Rectal cancer Neg Hx     Social History   Socioeconomic History  . Marital status: Married    Spouse name: Not on file  . Number of children: Not on file  . Years of education: Not on file  . Highest education level: Not on file  Occupational History  . Occupation: Lexicographer: [employer]  . Financial resource strain: Not on file  . Food insecurity:    Worry: Not on file    Inability: Not on file  . Transportation needs:    Medical: Not on file    Non-medical: Not on file  Tobacco Use  . Smoking status: Former Smoker    Last attempt to quit: 07/24/2010    Years since quitting: 6.9  . Smokeless tobacco: Never Used  Substance and Sexual Activity  . Alcohol use: Yes    Comment: occasionally  . Drug use: No  . Sexual activity: Not on file  Lifestyle  . Physical activity:  Days per week: Not on file    Minutes per session: Not on file  . Stress: Not on file  Relationships  . Social connections:    Talks on phone: Not on file    Gets together: Not on file    Attends religious service: Not on file    Active member of club or organization: Not on file    Attends meetings of clubs or organizations: Not on file    Relationship status: Not on file  . Intimate partner violence:    Fear of current or ex partner: Not on file    Emotionally abused: Not on file    Physically abused: Not on file    Forced sexual activity: Not on file  Other Topics Concern  . Not on file  Social History Narrative  . Not on file    Review of Systems: As per HPI, otherwise neg  Physical Exam: Vital signs in last 24 hours: Temp:  [98.4 F (36.9 C)] 98.4 F (36.9 C) (03/21 1707) Pulse Rate:  [85-89] 85 (03/21 1911) Resp:  [18] 18 (03/21 1911) BP: (137-154)/(92-95) 137/92 (03/21 1911) SpO2:  [92 %-96 %] 92 % (03/21 1911)   Gen: awake, alert, NAD HEENT: anicteric, op clear CV: RRR, no mrg Pulm: CTA b/l Abd: soft, NT/ND, +BS throughout Ext: no c/c/e Neuro: nonfocal   IMPRESSION:  62 year old with acute esophageal food impaction  PLAN: 1.  Emergent upper endoscopy to relieve food impaction at bedside in ER. The nature of the procedure, as well as the risks, benefits, and alternatives were carefully and thoroughly reviewed with the patient. Ample time for  discussion and questions allowed. The patient understood, was satisfied, and agreed to proceed.      Lajuan Lines Yola Paradiso  07/10/2017, 8:59 PM

## 2017-07-10 NOTE — Op Note (Addendum)
[facility] ([facility]) Patient Name: Gerald [name] Procedure Date: 07/10/2017 MRN: [id number] Attending MD: Jerene Bears , MD Date of Birth: 1955-09-02 CSN: [id number] Age: 62 Admit Type: Outpatient Procedure:                Upper GI endoscopy Indications:              Dysphagia, Foreign body in the esophagus Providers:                Lajuan Lines. Hilarie Fredrickson, MD, Burtis Junes, RN, Cherylynn Ridges,                            Technician Referring MD:             Dr. Tomi Bamberger (ER MD) Medicines:                Midazolam 10 mg IV, Fentanyl 100 micrograms IV Complications:            No immediate complications. Estimated Blood Loss:     Estimated blood loss: none. Procedure:                Pre-Anesthesia Assessment:                           - Prior to the procedure, a History and Physical                            was performed, and patient medications and                            allergies were reviewed. The patient's tolerance of                            previous anesthesia was also reviewed. The risks                            and benefits of the procedure and the sedation                            options and risks were discussed with the patient.                            All questions were answered, and informed consent                            was obtained. Prior Anticoagulants: The patient has                            taken no previous anticoagulant or antiplatelet                            agents. ASA Grade Assessment: II - A patient with                            mild systemic disease. After reviewing the risks  and benefits, the patient was deemed in                            satisfactory condition to undergo the procedure.                           After obtaining informed consent, the endoscope was                            passed under direct vision. Throughout the                            procedure, the patient's blood pressure, pulse, and                             oxygen saturations were monitored continuously. The                            EG-[id number] ([id number]) scope was introduced through the                            mouth, and advanced to the second part of duodenum.                            The upper GI endoscopy was accomplished without                            difficulty. The patient tolerated the procedure                            well. Scope In: Scope Out: Findings:      Food was found in the lower third of the esophagus. There was a large       bolus which could not be easily advanced into the stomach. Complete       removal of food was accomplished by using cap-suction technique.      Mildly severe esophagitis with no bleeding was found in the middle and       lower third of the esophagus. Likely reflux, cannot completely exclude       EoE.      One moderate (circumferential scarring or stenosis; an endoscope may       pass) benign-appearing, intrinsic stenosis was found 38 cm from the       incisors. And was traversed.      Diffuse moderate inflammation characterized by congestion (edema),       erosions and erythema was found in the entire examined stomach.      One non-bleeding superficial gastric ulcer was found in the gastric       antrum. The lesion was 5 mm in largest dimension.      The examined duodenum was normal. Impression:               - Food in the lower third of the esophagus. Removal                            was successful.                           -  Mildly severe reflux esophagitis.                           - Benign-appearing distal esophageal stenosis.                           - Gastritis and small gastric ulcer.                           - Normal examined duodenum. Moderate Sedation:      Moderate (conscious) sedation was administered by the endoscopy nurse       and supervised by the endoscopist. The following parameters were       monitored: oxygen saturation, heart rate,  blood pressure, and response       to care. Total physician intraservice time was 18 minutes. Recommendation:           - Patient has a contact number available for                            emergencies. The signs and symptoms of potential                            delayed complications were discussed with the                            patient. Return to normal activities tomorrow.                            Written discharge instructions were provided to the                            patient.                           - Soft diet.                           - Continue present medications including                            pantoprazole 40 mg once daily.                           - Avoid NSAIDs given gastritis and ulcer.                           - Repeat upper endoscopy at appointment to be                            scheduled for esophageal dilation and to follow-up                            esophagitis and gastritis. Procedure Code(s):        --- Professional ---  56387, Moderate sedation services provided by the                            same physician or other qualified health care                            professional performing the diagnostic or                            therapeutic service that the sedation supports,                            requiring the presence of an independent trained                            observer to assist in the monitoring of the                            patient's level of consciousness and physiological                            status; initial 15 minutes of intraservice time,                            patient age 60 years or older Diagnosis Code(s):        --- Professional ---                           [phone], Food in esophagus causing other injury,                            initial encounter                           K21.0, Gastro-esophageal reflux disease with                            esophagitis                            K22.2, Esophageal obstruction                           K29.70, Gastritis, unspecified, without bleeding                           K25.9, Gastric ulcer, unspecified as acute or                            chronic, without hemorrhage or perforation                           R13.10, Dysphagia, unspecified                           T18.108A, Unspecified foreign body in esophagus  causing other injury, initial encounter CPT copyright 2016 American Medical Association. All rights reserved. The codes documented in this report are preliminary and upon coder review may  be revised to meet current compliance requirements. Jerene Bears, MD 07/10/2017 10:47:07 PM This report has been signed electronically. Number of Addenda: 0

## 2017-07-10 NOTE — ED Notes (Signed)
Gave pt a coke at his request

## 2017-07-10 NOTE — ED Triage Notes (Signed)
Pt verbalizes "chicken stuck in throat from last night; can get water to go down but no food."

## 2017-07-10 NOTE — ED Notes (Signed)
Bed: VV61 Expected date:  Expected time:  Means of arrival:  Comments: Endo

## 2017-07-10 NOTE — ED Provider Notes (Signed)
[facility] DEPT Provider Note   CSN: [id number] Arrival date & time: 07/10/17  1703     History   Chief Complaint Chief Complaint  Patient presents with  . Swallowed Foreign Body    HPI Gerald [name] is a 62 y.o. male.  HPI   62 year old male with history of diabetes, gastric ulcer, hyperlipidemia, hypertension presenting with complaints of dysphagia.  Patient report last night he took the first bite of his dinner, a piece of chicken without bone he immediately felt that it was stuck in the back of his throat.  Denies any significant pain however he has had difficulty swallowing afterward.  If he drinks fluids very slowly he is able to advance the fluid however he is unable to eat any solid throughout the day.  No complaints of lightheadedness, dizziness, throat pain, neck pain, chest pain, trouble breathing.  No projectile vomiting.  He has had one similar episode approximately 1 year ago, was seen in the ED report a specialist did scoped him in the ER.  He was told that the piece of chicken was pushed to his stomach during that visit during the scope and no further treatment was given.  He denies alcohol abuse or history of cancer.  Denies tobacco abuse.  No other complaint.  Past Medical History:  Diagnosis Date  . Abnormal LFTs (liver function tests) 12/2012  . Allergy   . Anal condyloma   . Anemia 07/2010  . Diabetes mellitus   . Gastric ulcer 07/2010   bleeding GU, clipped and injected with epi. non-bleeding ulcer at [place]. biopsy negative for H pylori.  on follow up EGD 08/2010, ulcers totally heled  . Hyperlipemia   . Unspecified essential hypertension   . Upper GI bleed     Patient Active Problem List   Diagnosis Date Noted  . Esophageal obstruction due to food impaction   . Gastric ulcer with hemorrhage 09/13/2010  . Special screening for malignant neoplasms, colon 09/13/2010    Past Surgical History:  Procedure Laterality Date   . COLONOSCOPY  10/2010   sessile polyp (path: tubular adenoma) in tranverse colon. Dr Deatra Ina  . ESOPHAGOGASTRODUODENOSCOPY  07/24/2010   for hematemesis.  bleeding GU with VV, injected with epi and endoclipped.  second, non-bleeding ulcer at [facility]. biopsies negative for h pylori or dysplasia  . ESOPHAGOGASTRODUODENOSCOPY  08/2010   to assess the gastric ulcer.  study was normal.  no residual ulcers.  Dr Deatra Ina  . ESOPHAGOGASTRODUODENOSCOPY (EGD) WITH PROPOFOL N/A 03/08/2016   Procedure: ESOPHAGOGASTRODUODENOSCOPY (EGD) WITH PROPOFOL;  Surgeon: Jerene Bears, MD;  Location: [facility];  Service: Gastroenterology;  Laterality: N/A;  . GANGLION CYST EXCISION    . POLYPECTOMY    . UPPER GASTROINTESTINAL ENDOSCOPY         Home Medications    Prior to Admission medications   Medication Sig Start Date End Date Taking? Authorizing Provider  aspirin EC 81 MG tablet Take 81 mg by mouth at bedtime.    [provider]  glipiZIDE (GLUCOTROL XL) 10 MG 24 hr tablet TAKE 1 TABLET DAILY 06/09/17   Susy Frizzle, MD  hydrochlorothiazide (HYDRODIURIL) 25 MG tablet TAKE 1 TABLET DAILY 11/01/16   Susy Frizzle, MD  JANUVIA 100 MG tablet TAKE 1 TABLET DAILY 08/30/16   Susy Frizzle, MD  JARDIANCE 25 MG TABS tablet TAKE 1 TABLET DAILY 08/30/16   Susy Frizzle, MD  losartan (COZAAR) 100 MG tablet TAKE 1  TABLET DAILY 11/01/16   Susy Frizzle, MD  metFORMIN (GLUCOPHAGE) 1000 MG tablet TAKE 1 TABLET TWICE A DAY 03/24/17   Susy Frizzle, MD  Misc Natural Products (TRIPLE FLEX) CAPS Take 1 capsule by mouth.    [provider]  montelukast (SINGULAIR) 10 MG tablet TAKE 1 TABLET AT BEDTIME 01/29/17   Susy Frizzle, MD  Multiple Vitamin (MULTIVITAMIN PO) Take by mouth.      [provider]  naproxen sodium (ANAPROX) 220 MG tablet Take 440 mg by mouth 2 (two) times daily with a meal.    [provider]  pantoprazole (PROTONIX) 40 MG tablet TAKE 1 TABLET  DAILY BEFORE BREAKFAST (NEED OFFICE VISIT FOR FURTHER REFILLS) Patient not taking: Reported on 04/09/2017 03/05/17   Jerene Bears, MD  pravastatin (PRAVACHOL) 20 MG tablet TAKE 1 TABLET DAILY 07/07/17   Susy Frizzle, MD  sildenafil (VIAGRA) 100 MG tablet TAKE ONE-HALF (1/2) TO ONE TABLET DAILY AS NEEDED FOR ERECTILE DYSFUNCTION 08/09/16   Susy Frizzle, MD    Family History Family History  Problem Relation Age of Onset  . Diabetes Brother   . Stomach cancer Mother   . Colon cancer Neg Hx   . Colon polyps Neg Hx   . Esophageal cancer Neg Hx   . Rectal cancer Neg Hx     Social History Social History   Tobacco Use  . Smoking status: Former Smoker    Last attempt to quit: 07/24/2010    Years since quitting: 6.9  . Smokeless tobacco: Never Used  Substance Use Topics  . Alcohol use: Yes    Comment: occasionally  . Drug use: No     Allergies   Patient has no known allergies.   Review of Systems Review of Systems  All other systems reviewed and are negative.    Physical Exam Updated Vital Signs BP (!) 154/95 (BP Location: Left Arm)   Pulse 89   Temp 98.4 F (36.9 C) (Oral)   Resp 18   SpO2 96%   Physical Exam  Constitutional: He appears well-developed and well-nourished. No distress.  Well-appearing male, sitting in the chair in no acute discomfort, speaking in normal sentences with normal phonation.  No drooling.  HENT:  Head: Atraumatic.  Mouth/Throat: Oropharynx is clear and moist.  Eyes: Conjunctivae are normal.  Neck: Normal range of motion. Neck supple. No JVD present. No tracheal deviation present. No thyromegaly present.  Cardiovascular: Normal rate and regular rhythm.  Pulmonary/Chest: Effort normal and breath sounds normal. No stridor. No respiratory distress.  Abdominal: Soft. Bowel sounds are normal. He exhibits no distension. There is no tenderness.  Lymphadenopathy:    He has no cervical adenopathy.  Neurological: He is alert.  Skin: No  rash noted.  Psychiatric: He has a normal mood and affect.  Nursing note and vitals reviewed.    ED Treatments / Results  Labs (all labs ordered are listed, but only abnormal results are displayed) Labs Reviewed - No data to display  EKG  EKG Interpretation None       Radiology Dg Neck Soft Tissue  Result Date: 07/10/2017 CLINICAL DATA:  Possible chicken bone in throat. EXAM: NECK SOFT TISSUES - 1+ VIEW COMPARISON:  Neck x-ray dated March 08, 2016. FINDINGS: There is no evidence of retropharyngeal soft tissue swelling or epiglottic enlargement. The cervical airway is unremarkable and no radio-opaque foreign body identified. Bilateral carotid calcifications. Degenerative changes of the cervical spine. IMPRESSION: Negative. Electronically Signed  By: Titus Dubin M.D.   On: 07/10/2017 18:41    Procedures Procedures (including critical care time)  Medications Ordered in ED Medications  glucagon (human recombinant) (GLUCAGEN) injection 1 mg (1 mg Intravenous Given 07/10/17 1921)     Initial Impression / Assessment and Plan / ED Course  I have reviewed the triage vital signs and the nursing notes.  Pertinent labs & imaging results that were available during my care of the patient were reviewed by me and considered in my medical decision making (see chart for details).     BP (!) 154/95 (BP Location: Left Arm)   Pulse 89   Temp 98.4 F (36.9 C) (Oral)   Resp 18   SpO2 96%    Final Clinical Impressions(s) / ED Diagnoses   Final diagnoses:  Food impaction of esophagus, initial encounter    ED Discharge Orders        Ordered    pantoprazole (PROTONIX) 40 MG tablet     07/10/17 2332     6:34 PM Patient here complaining of dysphagia after eating a piece of chicken yesterday.  Patient felt that he did not chew the chicken appropriately.  Since then he is having trouble swallowing solid food, able to swallow a small amount of fluid.  He does not appears to have  any airway compromise, speaking in complete sentences with normal phonation, no drooling.  Vital signs stable, lungs are clear.  Will obtain initial neck soft tissue x-ray, give glucagon, and will perform a p.o. trial.  If no improvement, plan to consult GI for further management.  8:05 PM X-ray of soft tissue neck without any concerning feature.  However, patient failed p.o. trial.  Will consult GI specialist for further management.  IV access applied.  8:53 PM Appreciate consultation from GI specialist Dr. Hilarie Fredrickson who agrees to see pt for further management of his food impaction.    10:37 PM Dr. Hilarie Fredrickson has seen and evaluate pt in the ER . Bedside endoscopy performed, and Dr. Hilarie Fredrickson was able to remove a large chunk of food impaction that was lodged at the lower third of the esophagus.  Pt is currently drowsy from sedation.  Will monitor closely.  He request pt to be discharge home with pantoprazole 40mg  daily x 1 month.  Pt will f/u with GI for further management, which may include dilitation of the esophagus    11:30 PM Pt felt better, tolerates PO, and request to be discharge.  Return precaution given.    Domenic Moras, PA-C 07/10/17 2332    Dorie Rank, MD 07/13/17 [phone]

## 2017-07-10 NOTE — Discharge Instructions (Addendum)
Please take prantoprazole 40mg  daily for the next month.  Reach out to follow up with Dr. Hilarie Fredrickson in the next month.

## 2017-07-11 ENCOUNTER — Encounter (HOSPITAL_COMMUNITY): Payer: Self-pay | Admitting: Internal Medicine

## 2017-07-15 ENCOUNTER — Telehealth: Payer: Self-pay | Admitting: Internal Medicine

## 2017-07-15 NOTE — Telephone Encounter (Signed)
Needs daily PPI, pantoprazole 40 mg once daily EGD in 3-4 weeks for dilation

## 2017-07-15 NOTE — Telephone Encounter (Signed)
Dr. Hilarie Fredrickson does pt need an OV or can he just be a direct EGD and how soon does he need procedure. Please advise.

## 2017-07-16 NOTE — Telephone Encounter (Signed)
Pt scheduled for previsit 07/25/17@2 :30pm, EGD with dil scheduled in the [place] 08/06/17@10am . Pt is not on anticoag and is already on Protonix. Left message for pt to call back.

## 2017-07-17 NOTE — Telephone Encounter (Signed)
Spoke with pts wife and she is aware of appts.

## 2017-07-25 ENCOUNTER — Ambulatory Visit (AMBULATORY_SURGERY_CENTER): Payer: Self-pay | Admitting: *Deleted

## 2017-07-25 ENCOUNTER — Other Ambulatory Visit: Payer: Self-pay

## 2017-07-25 VITALS — Ht 72.0 in | Wt 233.6 lb

## 2017-07-25 DIAGNOSIS — R1319 Other dysphagia: Secondary | ICD-10-CM

## 2017-07-25 DIAGNOSIS — R1310 Dysphagia, unspecified: Secondary | ICD-10-CM

## 2017-07-25 NOTE — Progress Notes (Signed)
No egg or soy allergy known to patient  No issues with past sedation with any surgeries  or procedures, no intubation problems  No diet pills per patient No home 02 use per patient  No blood thinners per patient  Pt denies issues with constipation  No A fib or A flutter  EMMI video sent to pt's e mail pt declined   

## 2017-07-29 ENCOUNTER — Other Ambulatory Visit: Payer: Self-pay | Admitting: Family Medicine

## 2017-07-31 ENCOUNTER — Encounter: Payer: Self-pay | Admitting: Internal Medicine

## 2017-08-06 ENCOUNTER — Other Ambulatory Visit: Payer: Self-pay

## 2017-08-06 ENCOUNTER — Ambulatory Visit (AMBULATORY_SURGERY_CENTER): Payer: BLUE CROSS/BLUE SHIELD | Admitting: Internal Medicine

## 2017-08-06 ENCOUNTER — Encounter: Payer: Self-pay | Admitting: Internal Medicine

## 2017-08-06 VITALS — BP 121/79 | HR 72 | Temp 97.7°F | Resp 18 | Ht 72.0 in | Wt 233.0 lb

## 2017-08-06 DIAGNOSIS — K259 Gastric ulcer, unspecified as acute or chronic, without hemorrhage or perforation: Secondary | ICD-10-CM

## 2017-08-06 DIAGNOSIS — K222 Esophageal obstruction: Secondary | ICD-10-CM | POA: Diagnosis not present

## 2017-08-06 DIAGNOSIS — R1310 Dysphagia, unspecified: Secondary | ICD-10-CM

## 2017-08-06 DIAGNOSIS — R1319 Other dysphagia: Secondary | ICD-10-CM

## 2017-08-06 DIAGNOSIS — K2950 Unspecified chronic gastritis without bleeding: Secondary | ICD-10-CM | POA: Diagnosis not present

## 2017-08-06 MED ORDER — SODIUM CHLORIDE 0.9 % IV SOLN: 500.0000 mL | Freq: Once | INTRAVENOUS | Status: DC

## 2017-08-06 MED ORDER — PANTOPRAZOLE SODIUM 40 MG PO TBEC: 40.0000 mg | DELAYED_RELEASE_TABLET | Freq: Every day | ORAL | 3 refills | Status: DC

## 2017-08-06 NOTE — Progress Notes (Signed)
Called to room to assist during endoscopic procedure.  Patient ID and intended procedure confirmed with present staff. Received instructions for my participation in the procedure from the performing physician.  

## 2017-08-06 NOTE — Progress Notes (Signed)
Pt's states no medical or surgical changes since previsit or office visit. 

## 2017-08-06 NOTE — Progress Notes (Signed)
A and O x3. Report to RN. Tolerated MAC anesthesia well.Teeth unchanged after procedure.

## 2017-08-06 NOTE — Op Note (Signed)
[place] Patient Name: Gerald [name] Procedure Date: 08/06/2017 10:03 AM MRN: [id number] Endoscopist: Jerene Bears , MD Age: 62 Referring MD:  Date of Birth: 1956-04-14 Gender: Male Account #: [id number] Procedure:                Upper GI endoscopy Indications:              Dysphagia, For therapy of esophageal stricture,                            history of esophageal food impaction requiring                            emergent EGD in Nov 2017 and March 2019 Medicines:                Monitored Anesthesia Care Procedure:                Pre-Anesthesia Assessment:                           - Prior to the procedure, a History and Physical                            was performed, and patient medications and                            allergies were reviewed. The patient's tolerance of                            previous anesthesia was also reviewed. The risks                            and benefits of the procedure and the sedation                            options and risks were discussed with the patient.                            All questions were answered, and informed consent                            was obtained. Prior Anticoagulants: The patient has                            taken no previous anticoagulant or antiplatelet                            agents. ASA Grade Assessment: II - A patient with                            mild systemic disease. After reviewing the risks                            and benefits, the patient was deemed in  satisfactory condition to undergo the procedure.                           After obtaining informed consent, the endoscope was                            passed under direct vision. Throughout the                            procedure, the patient's blood pressure, pulse, and                            oxygen saturations were monitored continuously. The                            Endoscope was  introduced through the mouth, and                            advanced to the second part of duodenum. The upper                            GI endoscopy was accomplished without difficulty.                            The patient tolerated the procedure well. Scope In: Scope Out: Findings:                 Two benign-appearing, intrinsic moderate                            (circumferential scarring or stenosis; an endoscope                            may pass) stenoses were found 38 to 40 cm from the                            incisors. The narrowest stenosis measured 2 cm (in                            length). The stenoses were traversed. A TTS dilator                            was passed through the scope. Dilation with a                            16-17-18 mm balloon dilator was performed to 16 mm.                            The dilation site was examined and showed moderate                            improvement in luminal narrowing with heme present.  1 cm hiatal hernia.                           Moderate inflammation characterized by erythema was                            found in the gastric body, at the incisura and in                            the gastric antrum. There is a shallow ulceration                            which appears to be healing at the incisura.                            Biopsies were taken with a cold forceps for                            histology and Helicobacter pylori testing (body,                            antrum and incisura).                           The examined duodenum was normal. Complications:            No immediate complications. Estimated Blood Loss:     Estimated blood loss was minimal. Impression:               - Benign-appearing esophageal stenoses. Dilated to                            16 mm                           - Gastritis and healing ulcer at incisura. Biopsied.                           - Normal  examined duodenum. Recommendation:           - Patient has a contact number available for                            emergencies. The signs and symptoms of potential                            delayed complications were discussed with the                            patient. Return to normal activities tomorrow.                            Written discharge instructions were provided to the                            patient.                           -  Post-dilation diet then advance as tolerated.                            Continue to chew food very well and avoid eating                            fast.                           - Continue present medications including daily                            pantoprazole.                           - Await pathology results.                           - Avoid aspirin (greater than a baby aspirin),                            ibuprofen, naproxen, or other non-steroidal                            anti-inflammatory drugs.                           - Repeat upper endoscopy as needed for retreatment. Jerene Bears, MD 08/06/2017 10:30:09 AM This report has been signed electronically.

## 2017-08-06 NOTE — Patient Instructions (Signed)
*  Handouts given on stenosis, esophageal and dilation diet.  YOU HAD AN ENDOSCOPIC PROCEDURE TODAY AT [facility]:   Refer to the procedure report that was given to you for any specific questions about what was found during the examination.  If the procedure report does not answer your questions, please call your gastroenterologist to clarify.  If you requested that your care partner not be given the details of your procedure findings, then the procedure report has been included in a sealed envelope for you to review at your convenience later.  YOU SHOULD EXPECT: Some feelings of bloating in the abdomen. Passage of more gas than usual.  Walking can help get rid of the air that was put into your GI tract during the procedure and reduce the bloating. If you had a lower endoscopy (such as a colonoscopy or flexible sigmoidoscopy) you may notice spotting of blood in your stool or on the toilet paper. If you underwent a bowel prep for your procedure, you may not have a normal bowel movement for a few days.  Please Note:  You might notice some irritation and congestion in your nose or some drainage.  This is from the oxygen used during your procedure.  There is no need for concern and it should clear up in a day or so.  SYMPTOMS TO REPORT IMMEDIATELY:    Following upper endoscopy (EGD)  Vomiting of blood or coffee ground material  New chest pain or pain under the shoulder blades  Painful or persistently difficult swallowing  New shortness of breath  Fever of 100F or higher  Black, tarry-looking stools  For urgent or emergent issues, a gastroenterologist can be reached at any hour by calling [phone].   DIET:  We do recommend a small meal at first, but then you may proceed to your regular diet.  Drink plenty of fluids but you should avoid alcoholic beverages for 24 hours.  ACTIVITY:  You should plan to take it easy for the rest of today and you should NOT DRIVE or use heavy  machinery until tomorrow (because of the sedation medicines used during the test).    FOLLOW UP: Our staff will call the number listed on your records the next business day following your procedure to check on you and address any questions or concerns that you may have regarding the information given to you following your procedure. If we do not reach you, we will leave a message.  However, if you are feeling well and you are not experiencing any problems, there is no need to return our call.  We will assume that you have returned to your regular daily activities without incident.  If any biopsies were taken you will be contacted by phone or by letter within the next 1-3 weeks.  Please call us at [phone] if you have not heard about the biopsies in 3 weeks.    SIGNATURES/CONFIDENTIALITY: You and/or your care partner have signed paperwork which will be entered into your electronic medical record.  These signatures attest to the fact that that the information above on your After Visit Summary has been reviewed and is understood.  Full responsibility of the confidentiality of this discharge information lies with you and/or your care-partner.

## 2017-08-07 ENCOUNTER — Telehealth: Payer: Self-pay

## 2017-08-07 ENCOUNTER — Telehealth: Payer: Self-pay | Admitting: *Deleted

## 2017-08-07 NOTE — Telephone Encounter (Signed)
Patient's insurance, [employer] [place] has denied pantoprazole as  "it is for a type of medication that is not on the member's prescription plan."   Dr Hilarie Fredrickson, would you like for me to try to send omeprazole and see if that would be covered? Not sure if any PPI's are covered under patient's plan since they did not give [place] much to go by.

## 2017-08-07 NOTE — Telephone Encounter (Signed)
  Follow up Call-  Call back number 08/06/2017 04/09/2017  Post procedure Call Back phone  # [phone]  Permission to leave phone message Yes Yes  Some recent data might be hidden    LMOM, will attempt to reach later.

## 2017-08-07 NOTE — Telephone Encounter (Signed)
  Follow up Call-  Call back number 08/06/2017 04/09/2017  Post procedure Call Back phone  # [phone]  Permission to leave phone message Yes Yes  Some recent data might be hidden     Patient questions:  Do you have a fever, pain , or abdominal swelling? No. Pain Score  0 *  Have you tolerated food without any problems? Yes.    Have you been able to return to your normal activities? Yes.    Do you have any questions about your discharge instructions: Diet   No. Medications  No. Follow up visit  No.  Do you have questions or concerns about your Care? No.  Actions: * If pain score is 4 or above: No action needed, pain <4.

## 2017-08-08 NOTE — Telephone Encounter (Signed)
Yes,  He definitely needs PPI daily Ok to try omeprazole 40 mg once daily, (lansoprazole 30 mg once daily, esomeprazole 40 mg once daily would also be suitable alternatives)

## 2017-08-11 NOTE — Telephone Encounter (Signed)
Left voicemail for patient to call back. 

## 2017-08-12 NOTE — Telephone Encounter (Signed)
I have spoken to patient's wife (okay to speak with per DPR) to advise that insurance will not cover pantoprazole. Per wife, she called insurance and was told they will not cover any PPI as there are several over the counter options. I advised that they can purchase pantoprazole over the counter although more expensive than if we were able to send by prescription and have covered by an insurance. She verbalizes understanding and will advise patient.

## 2017-08-12 NOTE — Telephone Encounter (Signed)
Left voicemail for patient to call back. 

## 2017-08-13 ENCOUNTER — Encounter: Payer: Self-pay | Admitting: Internal Medicine

## 2017-08-25 ENCOUNTER — Other Ambulatory Visit: Payer: Self-pay | Admitting: Family Medicine

## 2017-09-06 ENCOUNTER — Other Ambulatory Visit: Payer: Self-pay | Admitting: Family Medicine

## 2017-09-20 ENCOUNTER — Other Ambulatory Visit: Payer: Self-pay | Admitting: Family Medicine

## 2017-10-27 ENCOUNTER — Other Ambulatory Visit: Payer: Self-pay | Admitting: Family Medicine

## 2017-12-06 ENCOUNTER — Other Ambulatory Visit: Payer: Self-pay | Admitting: Family Medicine

## 2017-12-21 ENCOUNTER — Other Ambulatory Visit: Payer: Self-pay | Admitting: Family Medicine

## 2018-01-30 ENCOUNTER — Encounter: Payer: Self-pay | Admitting: Family Medicine

## 2018-01-30 ENCOUNTER — Ambulatory Visit (INDEPENDENT_AMBULATORY_CARE_PROVIDER_SITE_OTHER): Payer: BLUE CROSS/BLUE SHIELD | Admitting: Family Medicine

## 2018-01-30 VITALS — BP 140/72 | HR 86 | Temp 98.0°F | Resp 16 | Ht 72.0 in | Wt 234.0 lb

## 2018-01-30 DIAGNOSIS — E119 Type 2 diabetes mellitus without complications: Secondary | ICD-10-CM | POA: Diagnosis not present

## 2018-01-30 DIAGNOSIS — Z23 Encounter for immunization: Secondary | ICD-10-CM | POA: Diagnosis not present

## 2018-01-30 DIAGNOSIS — Z0000 Encounter for general adult medical examination without abnormal findings: Secondary | ICD-10-CM | POA: Diagnosis not present

## 2018-01-30 DIAGNOSIS — E7800 Pure hypercholesterolemia, unspecified: Secondary | ICD-10-CM | POA: Diagnosis not present

## 2018-01-30 DIAGNOSIS — Z125 Encounter for screening for malignant neoplasm of prostate: Secondary | ICD-10-CM | POA: Diagnosis not present

## 2018-01-30 DIAGNOSIS — I10 Essential (primary) hypertension: Secondary | ICD-10-CM | POA: Diagnosis not present

## 2018-01-30 NOTE — Progress Notes (Signed)
Subjective:    Patient ID: Gerald [name], male    DOB: July 16, 1955, 62 y.o.   MRN: [id number]  HPI  Patient is here today for complete physical exam.  He denies any concerns.  He checks his fasting blood sugars every morning they typically are between 140 and 150 which are elevated.  However he states that his sugars drop throughout the remainder of the day and that they are usually less than 1:30 in the afternoon and evening hours.  He denies any hypoglycemia.  He denies any polyuria polydipsia or blurry vision.  He denies any chest pain, shortness of breath, or dyspnea on exertion.  His last colonoscopy was December 2018.  It is recommended he have a repeat colonoscopy in 3 years.  He is due for prostate cancer screening with PSA.  He has had Pneumovax 23.  He is due for Prevnar 13 at age 64.  He is due today for the flu shot.  Tetanus shot is up-to-date.  He is due for HIV screening but he politely declines this due to lack of risk factors.  Past Medical History:  Diagnosis Date  . Abnormal LFTs (liver function tests) 12/2012  . Allergy   . Anal condyloma   . Anemia 07/2010  . Diabetes mellitus   . Gastric ulcer 07/2010   bleeding GU, clipped and injected with epi. non-bleeding ulcer at [facility]. biopsy negative for H pylori.  on follow up EGD 08/2010, ulcers totally heled  . GERD with stricture   . Hyperlipemia   . Unspecified essential hypertension   . Upper GI bleed    Past Surgical History:  Procedure Laterality Date  . COLONOSCOPY  10/2010   sessile polyp (path: tubular adenoma) in tranverse colon. Dr Deatra Ina  . ESOPHAGOGASTRODUODENOSCOPY  07/24/2010   for hematemesis.  bleeding GU with VV, injected with epi and endoclipped.  second, non-bleeding ulcer at [facility]. biopsies negative for h pylori or dysplasia  . ESOPHAGOGASTRODUODENOSCOPY  08/2010   to assess the gastric ulcer.  study was normal.  no residual ulcers.  Dr Deatra Ina  . ESOPHAGOGASTRODUODENOSCOPY N/A 07/10/2017   Procedure: ESOPHAGOGASTRODUODENOSCOPY (EGD);  Surgeon: Jerene Bears, MD;  Location: [facility];  Service: Gastroenterology;  Laterality: N/A;  . ESOPHAGOGASTRODUODENOSCOPY (EGD) WITH PROPOFOL N/A 03/08/2016   Procedure: ESOPHAGOGASTRODUODENOSCOPY (EGD) WITH PROPOFOL;  Surgeon: Jerene Bears, MD;  Location: [facility];  Service: Gastroenterology;  Laterality: N/A;  . GANGLION CYST EXCISION    . POLYPECTOMY    . UPPER GASTROINTESTINAL ENDOSCOPY     Current Outpatient Medications on File Prior to Visit  Medication Sig Dispense Refill  . aspirin EC 81 MG tablet Take 81 mg by mouth at bedtime.    Marland Kitchen glipiZIDE (GLUCOTROL XL) 10 MG 24 hr tablet TAKE 1 TABLET DAILY 90 tablet 2  . hydrochlorothiazide (HYDRODIURIL) 25 MG tablet TAKE 1 TABLET (25 mg)  DAILY 90 tablet 2  . JANUVIA 100 MG tablet TAKE 1 TABLET DAILY 90 tablet 3  . JARDIANCE 25 MG TABS tablet TAKE 1 TABLET DAILY 90 tablet 3  . losartan (COZAAR) 100 MG tablet TAKE 1 TABLET DAILY 90 tablet 2  . metFORMIN (GLUCOPHAGE) 1000 MG tablet TAKE 1 TABLET TWICE A DAY 180 tablet 0  . Misc Natural Products (TRIPLE FLEX) CAPS Take 1 capsule by mouth.    . montelukast (SINGULAIR) 10 MG tablet TAKE 1 TABLET AT BEDTIME 90 tablet 2  . Multiple Vitamin (MULTIVITAMIN PO) Take by mouth.      Marland Kitchen  naproxen sodium (ANAPROX) 220 MG tablet Take 440 mg by mouth 2 (two) times daily with a meal.    . pantoprazole (PROTONIX) 40 MG tablet Take 1 tablet (40 mg total) by mouth daily. 90 tablet 3  . pravastatin (PRAVACHOL) 20 MG tablet TAKE 1 TABLET DAILY 90 tablet 2  . sildenafil (VIAGRA) 100 MG tablet TAKE ONE-HALF (1/2) TO ONE TABLET DAILY AS NEEDED FOR ERECTILE DYSFUNCTION 10 tablet 11   Current Facility-Administered Medications on File Prior to Visit  Medication Dose Route Frequency Provider Last Rate Last Dose  . 0.9 %  sodium chloride infusion  500 mL Intravenous Once Pyrtle, Lajuan Lines, MD      . 0.9 %  sodium chloride infusion  500 mL Intravenous Once Pyrtle, Lajuan Lines,  MD       No Known Allergies Social History   Socioeconomic History  . Marital status: Married    Spouse name: Not on file  . Number of children: Not on file  . Years of education: Not on file  . Highest education level: Not on file  Occupational History  . Occupation: Lexicographer: [employer]  . Financial resource strain: Not on file  . Food insecurity:    Worry: Not on file    Inability: Not on file  . Transportation needs:    Medical: Not on file    Non-medical: Not on file  Tobacco Use  . Smoking status: Former Smoker    Last attempt to quit: 07/24/2010    Years since quitting: 7.5  . Smokeless tobacco: Former Systems developer    Types: Chew  Substance and Sexual Activity  . Alcohol use: Yes    Comment: occasionally  . Drug use: No  . Sexual activity: Not on file  Lifestyle  . Physical activity:    Days per week: Not on file    Minutes per session: Not on file  . Stress: Not on file  Relationships  . Social connections:    Talks on phone: Not on file    Gets together: Not on file    Attends religious service: Not on file    Active member of club or organization: Not on file    Attends meetings of clubs or organizations: Not on file    Relationship status: Not on file  . Intimate partner violence:    Fear of current or ex partner: Not on file    Emotionally abused: Not on file    Physically abused: Not on file    Forced sexual activity: Not on file  Other Topics Concern  . Not on file  Social History Narrative  . Not on file   Family History  Problem Relation Age of Onset  . Diabetes Brother   . Stomach cancer Mother   . Colon cancer Neg Hx   . Colon polyps Neg Hx   . Esophageal cancer Neg Hx   . Rectal cancer Neg Hx       Review of Systems  All other systems reviewed and are negative.      Objective:   Physical Exam  Constitutional: He is oriented to person, place, and time. He appears well-developed and well-nourished. No  distress.  HENT:  Head: Normocephalic and atraumatic.  Right Ear: External ear normal.  Left Ear: External ear normal.  Nose: Nose normal.  Mouth/Throat: Oropharynx is clear and moist. No oropharyngeal exudate.  Eyes: Pupils are equal, round, and reactive to light. Conjunctivae and  EOM are normal. Right eye exhibits no discharge. Left eye exhibits no discharge. No scleral icterus.  Neck: Normal range of motion. Neck supple. No JVD present. No tracheal deviation present. No thyromegaly present.  Cardiovascular: Normal rate, regular rhythm, normal heart sounds and intact distal pulses. Exam reveals no gallop and no friction rub.  No murmur heard. Pulmonary/Chest: Effort normal and breath sounds normal. No stridor. No respiratory distress. He has no wheezes. He has no rales. He exhibits no tenderness.  Abdominal: Soft. Bowel sounds are normal. He exhibits no distension and no mass. There is no tenderness. There is no rebound and no guarding.  Musculoskeletal: Normal range of motion. He exhibits no edema, tenderness or deformity.  Lymphadenopathy:    He has no cervical adenopathy.  Neurological: He is alert and oriented to person, place, and time. He has normal reflexes. No cranial nerve deficit. He exhibits normal muscle tone. Coordination normal.  Skin: Skin is warm. No rash noted. He is not diaphoretic. No erythema. No pallor.  Psychiatric: He has a normal mood and affect. His behavior is normal. Judgment and thought content normal.  Vitals reviewed.  Patient has a small sebaceous cyst in the middle of his back at approximate level of T7.  It is approximately 2 cm in diameter      Assessment & Plan:  Routine general medical examination at a health care facility - Plan: Hemoglobin A1c, CBC with Differential/Platelet, COMPLETE METABOLIC PANEL WITH GFR, Lipid panel, Microalbumin, urine, PSA  Controlled type 2 diabetes mellitus without complication, without long-term current use of insulin  (HCC) - Plan: Hemoglobin A1c, CBC with Differential/Platelet, COMPLETE METABOLIC PANEL WITH GFR, Lipid panel, Microalbumin, urine  Essential hypertension  Pure hypercholesterolemia  Prostate cancer screening - Plan: PSA  Patient's blood pressure here today is acceptable at 140/72.  He states that his blood pressures are typically in the 314 systolic at home.  His blood sugars are marginally controlled.  I will check a hemoglobin A1c.  As long as his hemoglobin A1c is less than 7 I will not make any changes in his medication.  I will also check for diabetic nephropathy with a urine microalbumin.  I will check a fasting lipid panel.  Ideally I like his LDL cholesterol less than 100.  I will screen for prostate cancer with a PSA.  He received a high-dose flu vaccine today.  His Pneumovax 23 is up-to-date.  Diabetic foot exam is performed today and is normal.  Colonoscopy is up-to-date.  I recommend a diabetic eye exam.  Patient first to schedule this on his own with his eye doctor.

## 2018-01-31 LAB — LIPID PANEL
CHOLESTEROL: 138 mg/dL (ref <200)
HDL: 32 mg/dL — AB (ref >40)
LDL CHOLESTEROL (CALC): 79 mg/dL
Non-HDL Cholesterol (Calc): 106 mg/dL (calc) (ref <130)
Total CHOL/HDL Ratio: 4.3 (calc) (ref <5.0)
Triglycerides: 160 mg/dL — ABNORMAL HIGH (ref <150)

## 2018-01-31 LAB — HEMOGLOBIN A1C
EAG (MMOL/L): 9.2 (calc)
HEMOGLOBIN A1C: 7.4 %{Hb} — AB (ref <5.7)
Mean Plasma Glucose: 166 (calc)

## 2018-01-31 LAB — COMPLETE METABOLIC PANEL WITH GFR
AG Ratio: 1.4 (calc) (ref 1.0–2.5)
ALBUMIN MSPROF: 4.4 g/dL (ref 3.6–5.1)
ALKALINE PHOSPHATASE (APISO): 52 U/L (ref 40–115)
ALT: 45 U/L (ref 9–46)
AST: 63 U/L — ABNORMAL HIGH (ref 10–35)
BUN: 15 mg/dL (ref 7–25)
CALCIUM: 9.9 mg/dL (ref 8.6–10.3)
CO2: 23 mmol/L (ref 20–32)
CREATININE: 0.86 mg/dL (ref 0.70–1.25)
Chloride: 99 mmol/L (ref 98–110)
GFR, EST NON AFRICAN AMERICAN: 93 mL/min/{1.73_m2} (ref >=60)
GFR, Est African American: 108 mL/min/{1.73_m2} (ref >=60)
GLUCOSE: 117 mg/dL — AB (ref 65–99)
Globulin: 3.2 g/dL (calc) (ref 1.9–3.7)
Potassium: 4.3 mmol/L (ref 3.5–5.3)
Sodium: 137 mmol/L (ref 135–146)
Total Bilirubin: 0.9 mg/dL (ref 0.2–1.2)
Total Protein: 7.6 g/dL (ref 6.1–8.1)

## 2018-01-31 LAB — CBC WITH DIFFERENTIAL/PLATELET
BASOS PCT: 1.7 %
Basophils Absolute: 143 cells/uL (ref 0–200)
EOS ABS: 118 {cells}/uL (ref 15–500)
EOS PCT: 1.4 %
HCT: 47.5 % (ref 38.5–50.0)
HEMOGLOBIN: 16.2 g/dL (ref 13.2–17.1)
Lymphs Abs: 1890 cells/uL (ref 850–3900)
MCH: 29.1 pg (ref 27.0–33.0)
MCHC: 34.1 g/dL (ref 32.0–36.0)
MCV: 85.3 fL (ref 80.0–100.0)
MONOS PCT: 8.6 %
MPV: 11 fL (ref 7.5–12.5)
NEUTROS ABS: 5527 {cells}/uL (ref 1500–7800)
Neutrophils Relative %: 65.8 %
Platelets: 171 10*3/uL (ref 140–400)
RBC: 5.57 10*6/uL (ref 4.20–5.80)
RDW: 13.8 % (ref 11.0–15.0)
TOTAL LYMPHOCYTE: 22.5 %
WBC mixed population: 722 cells/uL (ref 200–950)
WBC: 8.4 10*3/uL (ref 3.8–10.8)

## 2018-01-31 LAB — PSA: PSA: 0.4 ng/mL (ref <=4.0)

## 2018-01-31 LAB — MICROALBUMIN, URINE: MICROALB UR: 1.3 mg/dL

## 2018-02-06 ENCOUNTER — Other Ambulatory Visit: Payer: Self-pay | Admitting: Family Medicine

## 2018-02-06 DIAGNOSIS — E119 Type 2 diabetes mellitus without complications: Secondary | ICD-10-CM

## 2018-02-06 MED ORDER — LIRAGLUTIDE 18 MG/3ML ~~LOC~~ SOPN: PEN_INJECTOR | SUBCUTANEOUS | 3 refills | Status: DC

## 2018-02-09 ENCOUNTER — Telehealth: Payer: Self-pay | Admitting: *Deleted

## 2018-02-09 MED ORDER — DULAGLUTIDE 0.75 MG/0.5ML ~~LOC~~ SOAJ: 0.7500 mg | SUBCUTANEOUS | 0 refills | Status: AC

## 2018-02-09 MED ORDER — DULAGLUTIDE 1.5 MG/0.5ML ~~LOC~~ SOAJ: 1.5000 mg | SUBCUTANEOUS | 11 refills | Status: DC

## 2018-02-09 NOTE — Telephone Encounter (Signed)
Call placed to patient. Gerald [name].   Prescription sent to pharmacy.

## 2018-02-09 NOTE — Telephone Encounter (Signed)
Ok trulicity 5.44 mg weekly for 2 weeks then up to 1.5 mg weekly.

## 2018-02-09 NOTE — Telephone Encounter (Signed)
Received fax requesting alternative to Victoza.   Insurance prefers Byetta, Bydureon, or Musician.   MD please advise.

## 2018-02-10 NOTE — Telephone Encounter (Signed)
Call placed to patient and patient made aware.   States that he forgot to mention that insurance has not been covering [place]. States that he has not had medication >6 months. He believes this is why his sugars were uncontrolled.   Inquired as to if MD can prescribe a pill instead of injection that would be covered by insurance.   MD please advise.

## 2018-02-12 NOTE — Telephone Encounter (Signed)
Call placed to patient. LMTRC.  

## 2018-02-12 NOTE — Telephone Encounter (Signed)
Am not sure what is covered by his insurance however there are no other generic options for him.  He could switch Jardiance to farxiga 10 mg poqday.  He may have to contact his insurance and see what medication options are on his preferred plan

## 2018-02-12 NOTE — Telephone Encounter (Signed)
Call placed to patient and patient made aware.   Will try samples prior to sending to pharmacy.

## 2018-02-19 ENCOUNTER — Other Ambulatory Visit: Payer: Self-pay | Admitting: Family Medicine

## 2018-02-19 MED ORDER — BLOOD GLUCOSE MONITORING SUPPL KIT: PACK | 0 refills | Status: DC

## 2018-02-19 NOTE — Telephone Encounter (Signed)
Meter sent to pharmacy 

## 2018-02-19 NOTE — Telephone Encounter (Signed)
Pt needs a diabetic meter called in to [facility]

## 2018-03-11 ENCOUNTER — Other Ambulatory Visit: Payer: Self-pay | Admitting: Family Medicine

## 2018-03-11 MED ORDER — DAPAGLIFLOZIN PROPANEDIOL 5 MG PO TABS: 5.0000 mg | ORAL_TABLET | Freq: Every day | ORAL | 3 refills | Status: DC

## 2018-03-11 NOTE — Telephone Encounter (Signed)
Pt called and states that pt has been using Farxiga 5mg  and his BS has been running between 101-120 at the highest and needed rx called to pharm. Med sent to pharm.

## 2018-03-15 ENCOUNTER — Other Ambulatory Visit: Payer: Self-pay | Admitting: Family Medicine

## 2018-03-16 ENCOUNTER — Telehealth: Payer: Self-pay | Admitting: Family Medicine

## 2018-03-16 MED ORDER — DAPAGLIFLOZIN PROPANEDIOL 10 MG PO TABS: 10.0000 mg | ORAL_TABLET | Freq: Every day | ORAL | 0 refills | Status: DC

## 2018-03-16 NOTE — Telephone Encounter (Signed)
PA Submitted through [email or web address] and received the following:  Next Steps The plan will fax you a determination, typically within 1 to 5 business days. How do I follow up? Drug Farxiga 5MG  tablets Form [employer] [place] Standard Form for Medication Prior Authorization Requests [employer] [place] for Medication Pharmacy Prior Authorization Requests [phone]

## 2018-03-24 ENCOUNTER — Other Ambulatory Visit: Payer: Self-pay | Admitting: Family Medicine

## 2018-03-27 MED ORDER — DAPAGLIFLOZIN PROPANEDIOL 5 MG PO TABS: 5.0000 mg | ORAL_TABLET | Freq: Every day | ORAL | 3 refills | Status: DC

## 2018-03-27 NOTE — Telephone Encounter (Signed)
Received PA determination.   PA approved 03/17/2018- 03/26/2020.  Pharmacy made aware.

## 2018-04-03 ENCOUNTER — Other Ambulatory Visit: Payer: Self-pay | Admitting: Family Medicine

## 2018-04-25 ENCOUNTER — Other Ambulatory Visit: Payer: Self-pay | Admitting: Family Medicine

## 2018-07-24 ENCOUNTER — Other Ambulatory Visit: Payer: Self-pay | Admitting: Family Medicine

## 2018-08-20 ENCOUNTER — Other Ambulatory Visit: Payer: Self-pay | Admitting: Family Medicine

## 2018-09-03 ENCOUNTER — Other Ambulatory Visit: Payer: Self-pay | Admitting: Family Medicine

## 2018-11-22 ENCOUNTER — Other Ambulatory Visit: Payer: Self-pay | Admitting: Family Medicine

## 2018-12-29 ENCOUNTER — Other Ambulatory Visit: Payer: Self-pay | Admitting: Family Medicine

## 2019-03-02 ENCOUNTER — Encounter: Payer: BLUE CROSS/BLUE SHIELD | Admitting: Family Medicine

## 2019-03-08 ENCOUNTER — Other Ambulatory Visit: Payer: Self-pay

## 2019-03-09 ENCOUNTER — Other Ambulatory Visit: Payer: Self-pay | Admitting: Family Medicine

## 2019-03-09 ENCOUNTER — Other Ambulatory Visit: Payer: BC Managed Care – PPO

## 2019-03-09 ENCOUNTER — Ambulatory Visit (INDEPENDENT_AMBULATORY_CARE_PROVIDER_SITE_OTHER): Payer: BC Managed Care – PPO | Admitting: Family Medicine

## 2019-03-09 VITALS — BP 140/82 | HR 82 | Temp 97.4°F | Resp 18 | Ht 72.0 in | Wt 240.0 lb

## 2019-03-09 DIAGNOSIS — Z125 Encounter for screening for malignant neoplasm of prostate: Secondary | ICD-10-CM

## 2019-03-09 DIAGNOSIS — Z0000 Encounter for general adult medical examination without abnormal findings: Secondary | ICD-10-CM

## 2019-03-09 DIAGNOSIS — E119 Type 2 diabetes mellitus without complications: Secondary | ICD-10-CM

## 2019-03-09 DIAGNOSIS — Z23 Encounter for immunization: Secondary | ICD-10-CM | POA: Diagnosis not present

## 2019-03-09 DIAGNOSIS — I10 Essential (primary) hypertension: Secondary | ICD-10-CM

## 2019-03-09 DIAGNOSIS — E7800 Pure hypercholesterolemia, unspecified: Secondary | ICD-10-CM

## 2019-03-09 NOTE — Progress Notes (Signed)
Subjective:    Patient ID: Gerald [name], male    DOB: [birth year], 63 y.o.   MRN: [id number]  HPI  Patient is a very pleasant 63 year old Caucasian male here today for complete physical exam.  He denies any medical concerns.  His last colonoscopy was in 2018.  It was significant for 6 polyps.  His gastroenterologist recommended a repeat colonoscopy in 3 years so he is due next year.  He had his blood work drawn this morning and so we will check for prostate cancer with his PSA.  His immunization records are listed below.  His shots are up-to-date except for seasonal flu shot.  He elects to receive this here today.  Patient does have a history of smoking in the past however he quit several years ago.  However he does have a family history of an abdominal aortic aneurysm.  His father died from this.  Therefore he will be due for AAA screening when he turns 25.  We discussed this today and I will remind him again when he turns 39.  Patient also has a history of diabetes.  Diabetic foot exam was performed today and is normal.  He denies any neuropathy in his feet.  He denies any numbness or tingling or burning pain in his feet.  However his blood sugars do not sound well controlled.  He states that his average blood sugar is around 160.  He has variable sugars that can even be in the 200s.  Based on his sugar report, I suspect that his A1c will be near 8.  He is already taking glipizide, Metformin, Januvia, and [place].  Options would include Actos, Trulicity/Ozempic, or switching to insulin.  After discussing options, the patient would be interested in Trulicity or Ozempic if his A1c is above goal. Immunization History  Administered Date(s) Administered  . Influenza, High Dose Seasonal PF 02/06/2017, 01/30/2018  . Influenza,inj,Quad PF,6+ Mos 01/21/2015, 01/18/2016  . Influenza-Unspecified 01/13/2014  . Pneumococcal Polysaccharide-23 03/24/2015  . Tdap 12/16/2014  . Zoster 01/18/2016     Past  Medical History:  Diagnosis Date  . Abnormal LFTs (liver function tests) 12/2012  . Allergy   . Anal condyloma   . Anemia 07/2010  . Diabetes mellitus   . Gastric ulcer 07/2010   bleeding GU, clipped and injected with epi. non-bleeding ulcer at [place]. biopsy negative for H pylori.  on follow up EGD 08/2010, ulcers totally heled  . GERD with stricture   . Hyperlipemia   . Unspecified essential hypertension   . Upper GI bleed    Past Surgical History:  Procedure Laterality Date  . COLONOSCOPY  10/2010   sessile polyp (path: tubular adenoma) in tranverse colon. Dr Deatra Ina  . ESOPHAGOGASTRODUODENOSCOPY  07/24/2010   for hematemesis.  bleeding GU with VV, injected with epi and endoclipped.  second, non-bleeding ulcer at [facility]. biopsies negative for h pylori or dysplasia  . ESOPHAGOGASTRODUODENOSCOPY  08/2010   to assess the gastric ulcer.  study was normal.  no residual ulcers.  Dr Deatra Ina  . ESOPHAGOGASTRODUODENOSCOPY N/A 07/10/2017   Procedure: ESOPHAGOGASTRODUODENOSCOPY (EGD);  Surgeon: Jerene Bears, MD;  Location: [facility];  Service: Gastroenterology;  Laterality: N/A;  . ESOPHAGOGASTRODUODENOSCOPY (EGD) WITH PROPOFOL N/A 03/08/2016   Procedure: ESOPHAGOGASTRODUODENOSCOPY (EGD) WITH PROPOFOL;  Surgeon: Jerene Bears, MD;  Location: [facility];  Service: Gastroenterology;  Laterality: N/A;  . GANGLION CYST EXCISION    . POLYPECTOMY    . UPPER GASTROINTESTINAL ENDOSCOPY  Current Outpatient Medications on File Prior to Visit  Medication Sig Dispense Refill  . aspirin EC 81 MG tablet Take 81 mg by mouth at bedtime.    . dapagliflozin propanediol (FARXIGA) 5 MG TABS tablet Take 5 mg by mouth daily. 90 tablet 3  . glipiZIDE (GLUCOTROL XL) 10 MG 24 hr tablet TAKE 1 TABLET DAILY 90 tablet 3  . hydrochlorothiazide (HYDRODIURIL) 25 MG tablet TAKE 1 TABLET DAILY 90 tablet 4  . JANUVIA 100 MG tablet TAKE 1 TABLET DAILY 90 tablet 3  . losartan (COZAAR) 100 MG tablet TAKE 1 TABLET DAILY 90  tablet 4  . metFORMIN (GLUCOPHAGE) 1000 MG tablet TAKE 1 TABLET TWICE A DAY (NEED OFFICE VISIT AND LABS BEFORE FURTHER REFILLS) 180 tablet 4  . Misc Natural Products (TRIPLE FLEX) CAPS Take 1 capsule by mouth.    . montelukast (SINGULAIR) 10 MG tablet TAKE 1 TABLET AT BEDTIME 90 tablet 3  . Multiple Vitamin (MULTIVITAMIN PO) Take by mouth.      . naproxen sodium (ANAPROX) 220 MG tablet Take 440 mg by mouth 2 (two) times daily with a meal.    . ONETOUCH VERIO test strip USE TO CHECK BLOOD SUGAR 3 TIMES DAILY.ICD10:R73.01 100 each 3  . pravastatin (PRAVACHOL) 20 MG tablet TAKE 1 TABLET DAILY 90 tablet 3  . sildenafil (VIAGRA) 100 MG tablet TAKE ONE-HALF (1/2) TO ONE TABLET DAILY AS NEEDED FOR ERECTILE DYSFUNCTION 10 tablet 13   Current Facility-Administered Medications on File Prior to Visit  Medication Dose Route Frequency Provider Last Rate Last Dose  . 0.9 %  sodium chloride infusion  500 mL Intravenous Once Pyrtle, Lajuan Lines, MD      . 0.9 %  sodium chloride infusion  500 mL Intravenous Once Pyrtle, Lajuan Lines, MD       No Known Allergies Social History   Socioeconomic History  . Marital status: Married    Spouse name: Not on file  . Number of children: Not on file  . Years of education: Not on file  . Highest education level: Not on file  Occupational History  . Occupation: Lexicographer: [employer]  . Financial resource strain: Not on file  . Food insecurity    Worry: Not on file    Inability: Not on file  . Transportation needs    Medical: Not on file    Non-medical: Not on file  Tobacco Use  . Smoking status: Former Smoker    Quit date: 07/24/2010    Years since quitting: 8.6  . Smokeless tobacco: Former Systems developer    Types: Chew  Substance and Sexual Activity  . Alcohol use: Yes    Comment: occasionally  . Drug use: No  . Sexual activity: Not on file  Lifestyle  . Physical activity    Days per week: Not on file    Minutes per session: Not on file  .  Stress: Not on file  Relationships  . Social Herbalist on phone: Not on file    Gets together: Not on file    Attends religious service: Not on file    Active member of club or organization: Not on file    Attends meetings of clubs or organizations: Not on file    Relationship status: Not on file  . Intimate partner violence    Fear of current or ex partner: Not on file    Emotionally abused: Not on file  Physically abused: Not on file    Forced sexual activity: Not on file  Other Topics Concern  . Not on file  Social History Narrative  . Not on file   Family History  Problem Relation Age of Onset  . Diabetes Brother   . Stomach cancer Mother   . Colon cancer Neg Hx   . Colon polyps Neg Hx   . Esophageal cancer Neg Hx   . Rectal cancer Neg Hx       Review of Systems  All other systems reviewed and are negative.      Objective:   Physical Exam  Constitutional: He is oriented to person, place, and time. He appears well-developed and well-nourished. No distress.  HENT:  Head: Normocephalic and atraumatic.  Right Ear: External ear normal.  Left Ear: External ear normal.  Nose: Nose normal.  Mouth/Throat: Oropharynx is clear and moist. No oropharyngeal exudate.  Eyes: Pupils are equal, round, and reactive to light. Conjunctivae and EOM are normal. Right eye exhibits no discharge. Left eye exhibits no discharge. No scleral icterus.  Neck: Normal range of motion. Neck supple. No JVD present. No tracheal deviation present. No thyromegaly present.  Cardiovascular: Normal rate, regular rhythm, normal heart sounds and intact distal pulses. Exam reveals no gallop and no friction rub.  No murmur heard. Pulmonary/Chest: Effort normal and breath sounds normal. No stridor. No respiratory distress. He has no wheezes. He has no rales. He exhibits no tenderness.  Abdominal: Soft. Bowel sounds are normal. He exhibits no distension and no mass. There is no abdominal  tenderness. There is no rebound and no guarding.  Musculoskeletal: Normal range of motion.        General: No tenderness, deformity or edema.  Lymphadenopathy:    He has no cervical adenopathy.  Neurological: He is alert and oriented to person, place, and time. He has normal reflexes. No cranial nerve deficit. He exhibits normal muscle tone. Coordination normal.  Skin: Skin is warm. No rash noted. He is not diaphoretic. No erythema. No pallor.  Psychiatric: He has a normal mood and affect. His behavior is normal. Judgment and thought content normal.  Vitals reviewed.  Patient has a small sebaceous cyst in the middle of his back at approximate level of T7.  It is approximately 2 cm in diameter      Assessment & Plan:  Controlled type 2 diabetes mellitus without complication, without long-term current use of insulin (HCC) - Plan: Hemoglobin A1c, CBC with Differential, COMPLETE METABOLIC PANEL WITH GFR, Lipid Panel, Microalbumin, urine  Prostate cancer screening - Plan: PSA  Routine general medical examination at a health care facility  Essential hypertension  Pure hypercholesterolemia  Physical exam today is completely normal aside from an elevated BMI.  Blood pressure is adequately controlled at 140/82.  I am concerned about the patient's A1c.  I anticipate that it will be approximately 8.  If so, I would recommend adding Ozempic and discontinuing Januvia.  Ring the patient for prostate cancer with a PSA.  He received his flu shot today and therefore his immunizations are up-to-date.  Colonoscopy is due next year.  Check fasting lipid panel.  Goal LDL cholesterol is less than 100.  Recommended checking for a AAA when the patient turns 23.  Otherwise regular anticipatory guidance is provided.

## 2019-03-09 NOTE — Addendum Note (Signed)
Addended by: Shary Decamp B on: 03/09/2019 05:06 PM   Modules accepted: Orders

## 2019-03-10 LAB — CBC WITH DIFFERENTIAL/PLATELET
Absolute Monocytes: 639 cells/uL (ref 200–950)
Basophils Absolute: 116 cells/uL (ref 0–200)
Basophils Relative: 1.4 %
Eosinophils Absolute: 116 cells/uL (ref 15–500)
Eosinophils Relative: 1.4 %
HCT: 45.6 % (ref 38.5–50.0)
Hemoglobin: 15.2 g/dL (ref 13.2–17.1)
Lymphs Abs: 1909 cells/uL (ref 850–3900)
MCH: 28.9 pg (ref 27.0–33.0)
MCHC: 33.3 g/dL (ref 32.0–36.0)
MCV: 86.7 fL (ref 80.0–100.0)
MPV: 11.5 fL (ref 7.5–12.5)
Monocytes Relative: 7.7 %
Neutro Abs: 5520 cells/uL (ref 1500–7800)
Neutrophils Relative %: 66.5 %
Platelets: 131 10*3/uL — ABNORMAL LOW (ref 140–400)
RBC: 5.26 10*6/uL (ref 4.20–5.80)
RDW: 13.8 % (ref 11.0–15.0)
Total Lymphocyte: 23 %
WBC: 8.3 10*3/uL (ref 3.8–10.8)

## 2019-03-10 LAB — COMPREHENSIVE METABOLIC PANEL
AG Ratio: 1.4 (calc) (ref 1.0–2.5)
ALT: 39 U/L (ref 9–46)
AST: 46 U/L — ABNORMAL HIGH (ref 10–35)
Albumin: 4.3 g/dL (ref 3.6–5.1)
Alkaline phosphatase (APISO): 56 U/L (ref 35–144)
BUN: 22 mg/dL (ref 7–25)
CO2: 23 mmol/L (ref 20–32)
Calcium: 10 mg/dL (ref 8.6–10.3)
Chloride: 98 mmol/L (ref 98–110)
Creat: 0.95 mg/dL (ref 0.70–1.25)
Globulin: 3.1 g/dL (calc) (ref 1.9–3.7)
Glucose, Bld: 198 mg/dL — ABNORMAL HIGH (ref 65–99)
Potassium: 4.4 mmol/L (ref 3.5–5.3)
Sodium: 137 mmol/L (ref 135–146)
Total Bilirubin: 0.9 mg/dL (ref 0.2–1.2)
Total Protein: 7.4 g/dL (ref 6.1–8.1)

## 2019-03-10 LAB — LIPID PANEL
Cholesterol: 185 mg/dL (ref <200)
HDL: 29 mg/dL — ABNORMAL LOW (ref >=40)
LDL Cholesterol (Calc): 104 mg/dL — ABNORMAL HIGH
Non-HDL Cholesterol (Calc): 156 mg/dL — ABNORMAL HIGH (ref <130)
Total CHOL/HDL Ratio: 6.4 (calc) — ABNORMAL HIGH (ref <5.0)
Triglycerides: 387 mg/dL — ABNORMAL HIGH (ref <150)

## 2019-03-10 LAB — PSA: PSA: 0.2 ng/mL (ref <=4.0)

## 2019-03-10 LAB — MICROALBUMIN / CREATININE URINE RATIO
Creatinine, Urine: 59 mg/dL (ref 20–320)
Microalb Creat Ratio: 10 mcg/mg creat (ref <30)
Microalb, Ur: 0.6 mg/dL

## 2019-03-10 LAB — HEMOGLOBIN A1C
Hgb A1c MFr Bld: 9.1 %{Hb} — ABNORMAL HIGH (ref <5.7)
Mean Plasma Glucose: 214 (calc)
eAG (mmol/L): 11.9 (calc)

## 2019-03-16 ENCOUNTER — Other Ambulatory Visit: Payer: Self-pay | Admitting: Family Medicine

## 2019-03-16 MED ORDER — ESOMEPRAZOLE MAGNESIUM 20 MG PO CPDR: 20.0000 mg | DELAYED_RELEASE_CAPSULE | Freq: Every day | ORAL | 3 refills | Status: DC

## 2019-03-16 MED ORDER — TRULICITY 0.75 MG/0.5ML ~~LOC~~ SOAJ: 0.7500 mg | SUBCUTANEOUS | 0 refills | Status: DC

## 2019-03-18 ENCOUNTER — Other Ambulatory Visit: Payer: Self-pay | Admitting: Family Medicine

## 2019-03-23 ENCOUNTER — Other Ambulatory Visit: Payer: Self-pay | Admitting: Family Medicine

## 2019-03-23 MED ORDER — OMEPRAZOLE 40 MG PO CPDR: 40.0000 mg | DELAYED_RELEASE_CAPSULE | Freq: Every day | ORAL | 5 refills | Status: DC

## 2019-04-07 ENCOUNTER — Other Ambulatory Visit: Payer: Self-pay | Admitting: Family Medicine

## 2019-04-07 MED ORDER — OMEPRAZOLE 40 MG PO CPDR: 40.0000 mg | DELAYED_RELEASE_CAPSULE | Freq: Every day | ORAL | 3 refills | Status: DC

## 2019-04-14 ENCOUNTER — Other Ambulatory Visit: Payer: Self-pay | Admitting: Family Medicine

## 2019-04-14 MED ORDER — TRULICITY 1.5 MG/0.5ML ~~LOC~~ SOAJ: 1.5000 mg | SUBCUTANEOUS | 3 refills | Status: DC

## 2019-05-12 ENCOUNTER — Other Ambulatory Visit: Payer: Self-pay | Admitting: Family Medicine

## 2019-05-12 MED ORDER — TRULICITY 1.5 MG/0.5ML ~~LOC~~ SOAJ: 1.5000 mg | SUBCUTANEOUS | 1 refills | Status: DC

## 2019-05-21 ENCOUNTER — Other Ambulatory Visit: Payer: Self-pay | Admitting: Family Medicine

## 2019-05-21 MED ORDER — ONETOUCH VERIO VI STRP: ORAL_STRIP | 3 refills | Status: DC

## 2019-06-16 ENCOUNTER — Other Ambulatory Visit: Payer: Self-pay | Admitting: Family Medicine

## 2019-07-21 ENCOUNTER — Other Ambulatory Visit: Payer: Self-pay | Admitting: Family Medicine

## 2019-08-15 ENCOUNTER — Other Ambulatory Visit: Payer: Self-pay | Admitting: Family Medicine

## 2019-08-17 ENCOUNTER — Ambulatory Visit: Payer: BLUE CROSS/BLUE SHIELD | Attending: Internal Medicine

## 2019-08-17 DIAGNOSIS — Z23 Encounter for immunization: Secondary | ICD-10-CM

## 2019-08-17 NOTE — Progress Notes (Signed)
   Covid-19 Vaccination Clinic  Name:  Gerald [name]    MRN: [id number] DOB: 1955-06-06  08/17/2019  [name] was observed post Covid-19 immunization for 15 minutes without incident. He was provided with Vaccine Information Sheet and instruction to access the V-Safe system.   [name] was instructed to call 911 with any severe reactions post vaccine: Marland Kitchen Difficulty breathing  . Swelling of face and throat  . A fast heartbeat  . A bad rash all over body  . Dizziness and weakness   Immunizations Administered    Name Date Dose VIS Date Route   Pfizer COVID-19 Vaccine 08/17/2019  2:19 PM 0.3 mL 06/16/2018 Intramuscular   Manufacturer: [place]   Lot: [id number]   [place]: [id number]

## 2019-08-29 ENCOUNTER — Other Ambulatory Visit: Payer: Self-pay | Admitting: Family Medicine

## 2019-09-13 ENCOUNTER — Ambulatory Visit: Payer: Self-pay | Attending: Internal Medicine

## 2019-09-13 DIAGNOSIS — Z23 Encounter for immunization: Secondary | ICD-10-CM

## 2019-09-13 NOTE — Progress Notes (Signed)
   Covid-19 Vaccination Clinic  Name:  OSBON SVAY    MRN: [id number] DOB: 12-09-1955  09/13/2019  Mr. Seppala was observed post Covid-19 immunization for 15 minutes without incident. He was provided with Vaccine Information Sheet and instruction to access the V-Safe system.   Mr. Dehoog was instructed to call 911 with any severe reactions post vaccine: Marland Kitchen Difficulty breathing  . Swelling of face and throat  . A fast heartbeat  . A bad rash all over body  . Dizziness and weakness   Immunizations Administered    Name Date Dose VIS Date Route   Pfizer COVID-19 Vaccine 09/13/2019 12:45 PM 0.3 mL 06/16/2018 Intramuscular   Manufacturer: [place]   Lot: [id number]   [place]: [id number]

## 2019-10-14 ENCOUNTER — Other Ambulatory Visit: Payer: Self-pay | Admitting: Family Medicine

## 2019-11-26 ENCOUNTER — Ambulatory Visit: Payer: BC Managed Care – PPO | Admitting: Family Medicine

## 2019-12-03 ENCOUNTER — Other Ambulatory Visit: Payer: Self-pay

## 2019-12-03 ENCOUNTER — Ambulatory Visit: Payer: BC Managed Care – PPO | Admitting: Family Medicine

## 2019-12-03 VITALS — BP 140/70 | HR 94 | Temp 97.1°F | Ht 73.0 in | Wt 235.0 lb

## 2019-12-03 DIAGNOSIS — E7800 Pure hypercholesterolemia, unspecified: Secondary | ICD-10-CM

## 2019-12-03 DIAGNOSIS — E119 Type 2 diabetes mellitus without complications: Secondary | ICD-10-CM

## 2019-12-03 DIAGNOSIS — I10 Essential (primary) hypertension: Secondary | ICD-10-CM

## 2019-12-03 NOTE — Progress Notes (Signed)
Subjective:    Patient ID: Gerald [name], male    DOB: [birth year], 64 y.o.   MRN: [id number]  HPI  Patient is a very pleasant 64 year old Caucasian male here today for a checkup.  Ever since he started the Trulicity, he discontinued Januvia.  He states that his fasting blood sugar in the morning is averaging around 147.  He seldom sees his blood sugar over 200.  In the afternoons before supper it is typically under 130.  He denies any hypoglycemic episodes.  He denies any chest pain, shortness of breath, dyspnea on exertion.  He denies any neuropathy in his feet.  He denies any numbness in his feet.  He denies any claudication with ambulation.  He has had his Covid vaccine.  His blood pressure today is acceptable.  Diabetic foot exam was performed and is completely normal.  Past Medical History:  Diagnosis Date  . Abnormal LFTs (liver function tests) 12/2012  . Allergy   . Anal condyloma   . Anemia 07/2010  . Diabetes mellitus   . Gastric ulcer 07/2010   bleeding GU, clipped and injected with epi. non-bleeding ulcer at [place]. biopsy negative for H pylori.  on follow up EGD 08/2010, ulcers totally heled  . GERD with stricture   . Hyperlipemia   . Unspecified essential hypertension   . Upper GI bleed    Past Surgical History:  Procedure Laterality Date  . COLONOSCOPY  10/2010   sessile polyp (path: tubular adenoma) in tranverse colon. Dr Deatra Ina  . ESOPHAGOGASTRODUODENOSCOPY  07/24/2010   for hematemesis.  bleeding GU with VV, injected with epi and endoclipped.  second, non-bleeding ulcer at [facility]. biopsies negative for h pylori or dysplasia  . ESOPHAGOGASTRODUODENOSCOPY  08/2010   to assess the gastric ulcer.  study was normal.  no residual ulcers.  Dr Deatra Ina  . ESOPHAGOGASTRODUODENOSCOPY N/A 07/10/2017   Procedure: ESOPHAGOGASTRODUODENOSCOPY (EGD);  Surgeon: Jerene Bears, MD;  Location: [facility];  Service: Gastroenterology;  Laterality: N/A;  . ESOPHAGOGASTRODUODENOSCOPY (EGD)  WITH PROPOFOL N/A 03/08/2016   Procedure: ESOPHAGOGASTRODUODENOSCOPY (EGD) WITH PROPOFOL;  Surgeon: Jerene Bears, MD;  Location: [facility];  Service: Gastroenterology;  Laterality: N/A;  . GANGLION CYST EXCISION    . POLYPECTOMY    . UPPER GASTROINTESTINAL ENDOSCOPY     Current Outpatient Medications on File Prior to Visit  Medication Sig Dispense Refill  . aspirin EC 81 MG tablet Take 81 mg by mouth at bedtime.    Marland Kitchen esomeprazole (NEXIUM) 20 MG capsule Take 1 capsule (20 mg total) by mouth daily at 12 noon. 90 capsule 3  . FARXIGA 5 MG TABS tablet TAKE 1 TABLET DAILY 90 tablet 3  . glipiZIDE (GLUCOTROL XL) 10 MG 24 hr tablet TAKE 1 TABLET DAILY 90 tablet 3  . glucose blood (ONETOUCH VERIO) test strip USE TO CHECK BLOOD SUGAR 3 TIMES DAILY.ICD10:R73.01 100 each 3  . hydrochlorothiazide (HYDRODIURIL) 25 MG tablet TAKE 1 TABLET DAILY 90 tablet 3  . JANUVIA 100 MG tablet TAKE 1 TABLET DAILY 90 tablet 3  . losartan (COZAAR) 100 MG tablet TAKE 1 TABLET DAILY 90 tablet 3  . metFORMIN (GLUCOPHAGE) 1000 MG tablet TAKE 1 TABLET TWICE A DAY (NEED OFFICE VISIT AND LABS BEFORE FURTHER REFILLS) 180 tablet 3  . Misc Natural Products (TRIPLE FLEX) CAPS Take 1 capsule by mouth.    . montelukast (SINGULAIR) 10 MG tablet TAKE 1 TABLET AT BEDTIME 90 tablet 3  . Multiple Vitamin (MULTIVITAMIN PO) Take  by mouth.      . naproxen sodium (ANAPROX) 220 MG tablet Take 440 mg by mouth 2 (two) times daily with a meal.    . omeprazole (PRILOSEC) 40 MG capsule Take 1 capsule (40 mg total) by mouth daily. 90 capsule 3  . pravastatin (PRAVACHOL) 20 MG tablet TAKE 1 TABLET DAILY 90 tablet 3  . sildenafil (VIAGRA) 100 MG tablet TAKE ONE-HALF (1/2) TO ONE TABLET DAILY AS NEEDED FOR ERECTILE DYSFUNCTION 10 tablet 13  . TRULICITY 1.5 GE/9.5MW SOPN INJECT 1.5 MG UNDER THE SKIN ONCE A WEEK 6 mL 0   Current Facility-Administered Medications on File Prior to Visit  Medication Dose Route Frequency Provider Last Rate Last Admin    . 0.9 %  sodium chloride infusion  500 mL Intravenous Once Pyrtle, Lajuan Lines, MD      . 0.9 %  sodium chloride infusion  500 mL Intravenous Once Pyrtle, Lajuan Lines, MD       No Known Allergies Social History   Socioeconomic History  . Marital status: Married    Spouse name: Not on file  . Number of children: Not on file  . Years of education: Not on file  . Highest education level: Not on file  Occupational History  . 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 Smoking status: Former Smoker    Quit date: 07/24/2010    Years since quitting: 9.3  . Smokeless tobacco: Former Systems developer    Types: Secondary school teacher  . Vaping Use: Never used  Substance and Sexual Activity  . Alcohol use: Yes    Comment: occasionally  . Drug use: No  . Sexual activity: Not on file  Other Topics Concern  . Not on file  Social History Narrative  . Not on file   Social Determinants of Health   Financial Resource Strain:   . Difficulty of Paying Living Expenses:   Food Insecurity:   . Worried About Charity fundraiser in the Last Year:   . Arboriculturist in the Last Year:   Transportation Needs:   . Film/video editor (Medical):   Marland Kitchen Lack of Transportation (Non-Medical):   Physical Activity:   . Days of Exercise per Week:   . Minutes of Exercise per Session:   Stress:   . Feeling of Stress :   Social Connections:   . Frequency of Communication with Friends and Family:   . Frequency of Social Gatherings with Friends and Family:   . Attends Religious Services:   . Active Member of Clubs or Organizations:   . Attends Archivist Meetings:   Marland Kitchen Marital Status:   Intimate Partner Violence:   . Fear of Current or Ex-Partner:   . Emotionally Abused:   Marland Kitchen Physically Abused:   . Sexually Abused:    Family History  Problem Relation Age of Onset  . Diabetes Brother   . Stomach cancer Mother   . Colon cancer Neg Hx   . Colon polyps Neg Hx   . Esophageal cancer Neg Hx   .  Rectal cancer Neg Hx       Review of Systems  All other systems reviewed and are negative.      Objective:   Physical Exam Vitals reviewed.  Constitutional:      General: He is not in acute distress.    Appearance: He is well-developed. He is not diaphoretic.  HENT:     Head:  Normocephalic and atraumatic.     Right Ear: External ear normal.     Left Ear: External ear normal.     Nose: Nose normal.     Mouth/Throat:     Pharynx: No oropharyngeal exudate.  Eyes:     General: No scleral icterus.       Right eye: No discharge.        Left eye: No discharge.     Conjunctiva/sclera: Conjunctivae normal.     Pupils: Pupils are equal, round, and reactive to light.  Neck:     Thyroid: No thyromegaly.     Vascular: No JVD.     Trachea: No tracheal deviation.  Cardiovascular:     Rate and Rhythm: Normal rate and regular rhythm.     Heart sounds: Normal heart sounds. No murmur heard.  No friction rub. No gallop.   Pulmonary:     Effort: Pulmonary effort is normal. No respiratory distress.     Breath sounds: Normal breath sounds. No stridor. No wheezing or rales.  Chest:     Chest wall: No tenderness.  Abdominal:     General: Bowel sounds are normal. There is no distension.     Palpations: Abdomen is soft. There is no mass.     Tenderness: There is no abdominal tenderness. There is no guarding or rebound.  Musculoskeletal:        General: No tenderness or deformity. Normal range of motion.     Cervical back: Normal range of motion and neck supple.  Lymphadenopathy:     Cervical: No cervical adenopathy.  Skin:    General: Skin is warm.     Coloration: Skin is not pale.     Findings: No erythema or rash.  Neurological:     Mental Status: He is alert and oriented to person, place, and time.     Cranial Nerves: No cranial nerve deficit.     Motor: No abnormal muscle tone.     Coordination: Coordination normal.     Deep Tendon Reflexes: Reflexes are normal and symmetric.   Psychiatric:        Behavior: Behavior normal.        Thought Content: Thought content normal.        Judgment: Judgment normal.        Assessment & Plan:  Controlled type 2 diabetes mellitus without complication, without long-term current use of insulin (HCC) - Plan: CBC with Differential/Platelet, COMPLETE METABOLIC PANEL WITH GFR, Lipid panel, Microalbumin, urine, Hemoglobin A1c  Essential hypertension  Pure hypercholesterolemia  Patient's blood pressure today is acceptable.  I will check a fasting lipid panel.  Goal hemoglobin A1c is less than 7.  Goal LDL cholesterol is less than 100.  Check a urine microalbumin.  I suspect that his A1c will be near 7.  If greater than 7, patient wants to avoid insulin.  Therefore I would recommend Actos.  Immunizations are up-to-date.  Recommend a diabetic eye exam

## 2019-12-04 LAB — CBC WITH DIFFERENTIAL/PLATELET
Absolute Monocytes: 664 cells/uL (ref 200–950)
Basophils Absolute: 141 cells/uL (ref 0–200)
Basophils Relative: 1.7 %
Eosinophils Absolute: 141 cells/uL (ref 15–500)
Eosinophils Relative: 1.7 %
HCT: 46.7 % (ref 38.5–50.0)
Hemoglobin: 15.8 g/dL (ref 13.2–17.1)
Lymphs Abs: 1975 cells/uL (ref 850–3900)
MCH: 30.1 pg (ref 27.0–33.0)
MCHC: 33.8 g/dL (ref 32.0–36.0)
MCV: 89 fL (ref 80.0–100.0)
MPV: 11.5 fL (ref 7.5–12.5)
Monocytes Relative: 8 %
Neutro Abs: 5378 cells/uL (ref 1500–7800)
Neutrophils Relative %: 64.8 %
Platelets: 166 10*3/uL (ref 140–400)
RBC: 5.25 10*6/uL (ref 4.20–5.80)
RDW: 14.1 % (ref 11.0–15.0)
Total Lymphocyte: 23.8 %
WBC: 8.3 10*3/uL (ref 3.8–10.8)

## 2019-12-04 LAB — HEMOGLOBIN A1C
Hgb A1c MFr Bld: 8.4 % of total Hgb — ABNORMAL HIGH (ref <5.7)
Mean Plasma Glucose: 194 (calc)
eAG (mmol/L): 10.8 (calc)

## 2019-12-04 LAB — COMPLETE METABOLIC PANEL WITH GFR
AG Ratio: 1.3 (calc) (ref 1.0–2.5)
ALT: 45 U/L (ref 9–46)
AST: 50 U/L — ABNORMAL HIGH (ref 10–35)
Albumin: 4.4 g/dL (ref 3.6–5.1)
Alkaline phosphatase (APISO): 73 U/L (ref 35–144)
BUN/Creatinine Ratio: 25 (calc) — ABNORMAL HIGH (ref 6–22)
BUN: 26 mg/dL — ABNORMAL HIGH (ref 7–25)
CO2: 24 mmol/L (ref 20–32)
Calcium: 10.1 mg/dL (ref 8.6–10.3)
Chloride: 98 mmol/L (ref 98–110)
Creat: 1.02 mg/dL (ref 0.70–1.25)
GFR, Est African American: 90 mL/min/{1.73_m2} (ref >=60)
GFR, Est Non African American: 77 mL/min/{1.73_m2} (ref >=60)
Globulin: 3.5 g/dL (calc) (ref 1.9–3.7)
Glucose, Bld: 152 mg/dL — ABNORMAL HIGH (ref 65–99)
Potassium: 4.3 mmol/L (ref 3.5–5.3)
Sodium: 136 mmol/L (ref 135–146)
Total Bilirubin: 0.7 mg/dL (ref 0.2–1.2)
Total Protein: 7.9 g/dL (ref 6.1–8.1)

## 2019-12-04 LAB — LIPID PANEL
Cholesterol: 146 mg/dL (ref <200)
HDL: 32 mg/dL — ABNORMAL LOW (ref >=40)
LDL Cholesterol (Calc): 80 mg/dL (calc)
Non-HDL Cholesterol (Calc): 114 mg/dL (calc) (ref <130)
Total CHOL/HDL Ratio: 4.6 (calc) (ref <5.0)
Triglycerides: 258 mg/dL — ABNORMAL HIGH (ref <150)

## 2019-12-04 LAB — MICROALBUMIN, URINE: Microalb, Ur: 0.6 mg/dL

## 2019-12-06 ENCOUNTER — Other Ambulatory Visit: Payer: Self-pay | Admitting: Family Medicine

## 2019-12-06 MED ORDER — PIOGLITAZONE HCL 30 MG PO TABS
30.0000 mg | ORAL_TABLET | Freq: Every day | ORAL | 5 refills | Status: DC
Start: 2019-12-06 — End: 2021-01-03

## 2019-12-10 ENCOUNTER — Other Ambulatory Visit: Payer: Self-pay | Admitting: Family Medicine

## 2019-12-10 NOTE — Telephone Encounter (Signed)
Ok to refill 

## 2019-12-19 ENCOUNTER — Other Ambulatory Visit: Payer: Self-pay | Admitting: Family Medicine

## 2019-12-24 ENCOUNTER — Other Ambulatory Visit: Payer: Self-pay | Admitting: Family Medicine

## 2020-01-26 ENCOUNTER — Other Ambulatory Visit: Payer: Self-pay

## 2020-01-26 MED ORDER — ONETOUCH ULTRASOFT LANCETS MISC: 12 refills | Status: AC

## 2020-01-26 MED ORDER — ONETOUCH VERIO VI STRP: ORAL_STRIP | 3 refills | Status: DC

## 2020-02-18 ENCOUNTER — Other Ambulatory Visit: Payer: Self-pay | Admitting: Family Medicine

## 2020-03-05 ENCOUNTER — Other Ambulatory Visit: Payer: Self-pay | Admitting: Family Medicine

## 2020-03-07 MED ORDER — TRULICITY 1.5 MG/0.5ML ~~LOC~~ SOAJ: 1.5000 mg | SUBCUTANEOUS | 3 refills | Status: DC

## 2020-03-09 ENCOUNTER — Other Ambulatory Visit: Payer: Self-pay

## 2020-03-09 ENCOUNTER — Ambulatory Visit (INDEPENDENT_AMBULATORY_CARE_PROVIDER_SITE_OTHER): Payer: BC Managed Care – PPO | Admitting: Family Medicine

## 2020-03-09 VITALS — BP 150/80 | HR 84 | Temp 97.6°F | Ht 73.0 in | Wt 232.0 lb

## 2020-03-09 DIAGNOSIS — I10 Essential (primary) hypertension: Secondary | ICD-10-CM

## 2020-03-09 DIAGNOSIS — Z23 Encounter for immunization: Secondary | ICD-10-CM

## 2020-03-09 DIAGNOSIS — E119 Type 2 diabetes mellitus without complications: Secondary | ICD-10-CM

## 2020-03-09 DIAGNOSIS — E7800 Pure hypercholesterolemia, unspecified: Secondary | ICD-10-CM

## 2020-03-09 DIAGNOSIS — Z0000 Encounter for general adult medical examination without abnormal findings: Secondary | ICD-10-CM

## 2020-03-09 DIAGNOSIS — Z125 Encounter for screening for malignant neoplasm of prostate: Secondary | ICD-10-CM

## 2020-03-09 DIAGNOSIS — Z0001 Encounter for general adult medical examination with abnormal findings: Secondary | ICD-10-CM

## 2020-03-09 DIAGNOSIS — D126 Benign neoplasm of colon, unspecified: Secondary | ICD-10-CM

## 2020-03-09 MED ORDER — CLOTRIMAZOLE 1 % EX CREA: 1.0000 "application " | TOPICAL_CREAM | Freq: Two times a day (BID) | CUTANEOUS | 1 refills | Status: DC

## 2020-03-09 NOTE — Progress Notes (Signed)
Subjective:    Patient ID: Gerald [name], male    DOB: [birth year], 64 y.o.   MRN: [id number]  HPI  Patient is a very pleasant 64 year old Caucasian male here today for complete physical exam.  He denies any medical concerns.  His last colonoscopy was in 2018.  It was significant for 6 polyps.  His gastroenterologist recommended a repeat colonoscopy in 3 years so he is due now. He will be due for AAA screening when he turns 89.  We discussed this today and I will remind him again when he turns 28.  Patient also has a history of diabetes.  Diabetic foot exam was performed today and is normal.  He denies any neuropathy in his feet.  He denies any numbness or tingling or burning pain in his feet. We last checked his A1c in August and it was greater than 8. At that time we added Actos. He states that his average blood sugar over the last week has been 147. He believes his average sugars been close to 150 over the last 3 months. This includes fasting blood sugars and 2-hour postprandial sugars. He is due for the flu shot today. Covid shot is up-to-date. Pneumonia shot is up-to-date. Shingles shot is up-to-date. Immunization History  Administered Date(s) Administered  . Influenza, High Dose Seasonal PF 02/06/2017, 01/30/2018  . Influenza,inj,Quad PF,6+ Mos 01/21/2015, 01/18/2016, 03/09/2019, 03/09/2020  . Influenza-Unspecified 01/13/2014  . PFIZER SARS-COV-2 Vaccination 08/17/2019, 09/13/2019  . Pneumococcal Polysaccharide-23 03/24/2015  . Tdap 12/16/2014  . Zoster 01/18/2016     Past Medical History:  Diagnosis Date  . Abnormal LFTs (liver function tests) 12/2012  . Allergy   . Anal condyloma   . Anemia 07/2010  . Diabetes mellitus   . Gastric ulcer 07/2010   bleeding GU, clipped and injected with epi. non-bleeding ulcer at [place]. biopsy negative for H pylori.  on follow up EGD 08/2010, ulcers totally heled  . GERD with stricture   . Hyperlipemia   . Unspecified essential hypertension    . Upper GI bleed    Past Surgical History:  Procedure Laterality Date  . COLONOSCOPY  10/2010   sessile polyp (path: tubular adenoma) in tranverse colon. Dr Deatra Ina  . ESOPHAGOGASTRODUODENOSCOPY  07/24/2010   for hematemesis.  bleeding GU with VV, injected with epi and endoclipped.  second, non-bleeding ulcer at [facility]. biopsies negative for h pylori or dysplasia  . ESOPHAGOGASTRODUODENOSCOPY  08/2010   to assess the gastric ulcer.  study was normal.  no residual ulcers.  Dr Deatra Ina  . ESOPHAGOGASTRODUODENOSCOPY N/A 07/10/2017   Procedure: ESOPHAGOGASTRODUODENOSCOPY (EGD);  Surgeon: Jerene Bears, MD;  Location: [facility];  Service: Gastroenterology;  Laterality: N/A;  . ESOPHAGOGASTRODUODENOSCOPY (EGD) WITH PROPOFOL N/A 03/08/2016   Procedure: ESOPHAGOGASTRODUODENOSCOPY (EGD) WITH PROPOFOL;  Surgeon: Jerene Bears, MD;  Location: [facility];  Service: Gastroenterology;  Laterality: N/A;  . GANGLION CYST EXCISION    . POLYPECTOMY    . UPPER GASTROINTESTINAL ENDOSCOPY     Current Outpatient Medications on File Prior to Visit  Medication Sig Dispense Refill  . aspirin EC 81 MG tablet Take 81 mg by mouth at bedtime.    . Dulaglutide (TRULICITY) 1.5 OE/7.0JJ SOPN Inject 1.5 mg into the skin once a week. 6 mL 3  . FARXIGA 5 MG TABS tablet TAKE 1 TABLET DAILY 90 tablet 3  . glipiZIDE (GLUCOTROL XL) 10 MG 24 hr tablet TAKE 1 TABLET DAILY 90 tablet 3  . glucose blood (ONETOUCH VERIO)  test strip USE TO CHECK BLOOD SUGAR 3 TIMES DAILY.ICD10:R73.01 100 each 3  . hydrochlorothiazide (HYDRODIURIL) 25 MG tablet TAKE 1 TABLET DAILY 90 tablet 3  . Lancets (ONETOUCH ULTRASOFT) lancets USE TO CHECK BLOOD SUGAR 3 TIMES DAILY.ICD10:R73.01 100 each 12  . losartan (COZAAR) 100 MG tablet TAKE 1 TABLET DAILY 90 tablet 3  . metFORMIN (GLUCOPHAGE) 1000 MG tablet TAKE 1 TABLET TWICE A DAY (NEED OFFICE VISIT AND LABS BEFORE FURTHER REFILLS) 180 tablet 3  . Misc Natural Products (TRIPLE FLEX) CAPS Take 1 capsule  by mouth.    . montelukast (SINGULAIR) 10 MG tablet TAKE 1 TABLET AT BEDTIME 90 tablet 3  . Multiple Vitamin (MULTIVITAMIN PO) Take by mouth.      . naproxen sodium (ANAPROX) 220 MG tablet Take 440 mg by mouth 2 (two) times daily with a meal.    . omeprazole (PRILOSEC) 40 MG capsule Take 1 capsule (40 mg total) by mouth daily. 90 capsule 3  . pioglitazone (ACTOS) 30 MG tablet Take 1 tablet (30 mg total) by mouth daily. 30 tablet 5  . pravastatin (PRAVACHOL) 20 MG tablet TAKE 1 TABLET DAILY 90 tablet 3  . sildenafil (VIAGRA) 100 MG tablet TAKE ONE-HALF (1/2) TO ONE TABLET DAILY AS NEEDED FOR ERECTILE DYSFUNCTION 10 tablet 13   Current Facility-Administered Medications on File Prior to Visit  Medication Dose Route Frequency Provider Last Rate Last Admin  . 0.9 %  sodium chloride infusion  500 mL Intravenous Once Pyrtle, Lajuan Lines, MD      . 0.9 %  sodium chloride infusion  500 mL Intravenous Once Pyrtle, Lajuan Lines, MD       No Known Allergies Social History   Socioeconomic History  . Marital status: Married    Spouse name: Not on file  . Number of children: Not on file  . Years of education: Not on file  . Highest education level: Not on file  Occupational History  . Occupation: Lexicographer: [employer]  Tobacco Use  . Smoking status: Former Smoker    Quit date: 07/24/2010    Years since quitting: 9.6  . Smokeless tobacco: Former Systems developer    Types: Secondary school teacher  . Vaping Use: Never used  Substance and Sexual Activity  . Alcohol use: Yes    Comment: occasionally  . Drug use: No  . Sexual activity: Not on file  Other Topics Concern  . Not on file  Social History Narrative  . Not on file   Social Determinants of Health   Financial Resource Strain:   . Difficulty of Paying Living Expenses: Not on file  Food Insecurity:   . Worried About Charity fundraiser in the Last Year: Not on file  . Ran Out of Food in the Last Year: Not on file  Transportation Needs:   . Lack of  Transportation (Medical): Not on file  . Lack of Transportation (Non-Medical): Not on file  Physical Activity:   . Days of Exercise per Week: Not on file  . Minutes of Exercise per Session: Not on file  Stress:   . Feeling of Stress : Not on file  Social Connections:   . Frequency of Communication with Friends and Family: Not on file  . Frequency of Social Gatherings with Friends and Family: Not on file  . Attends Religious Services: Not on file  . Active Member of Clubs or Organizations: Not on file  . Attends Archivist Meetings:  Not on file  . Marital Status: Not on file  Intimate Partner Violence:   . Fear of Current or Ex-Partner: Not on file  . Emotionally Abused: Not on file  . Physically Abused: Not on file  . Sexually Abused: Not on file   Family History  Problem Relation Age of Onset  . Diabetes Brother   . Stomach cancer Mother   . Colon cancer Neg Hx   . Colon polyps Neg Hx   . Esophageal cancer Neg Hx   . Rectal cancer Neg Hx       Review of Systems  All other systems reviewed and are negative.      Objective:   Physical Exam Vitals reviewed.  Constitutional:      General: He is not in acute distress.    Appearance: He is well-developed. He is not diaphoretic.  HENT:     Head: Normocephalic and atraumatic.     Right Ear: External ear normal.     Left Ear: External ear normal.     Nose: Nose normal.     Mouth/Throat:     Pharynx: No oropharyngeal exudate.  Eyes:     General: No scleral icterus.       Right eye: No discharge.        Left eye: No discharge.     Conjunctiva/sclera: Conjunctivae normal.     Pupils: Pupils are equal, round, and reactive to light.  Neck:     Thyroid: No thyromegaly.     Vascular: No JVD.     Trachea: No tracheal deviation.  Cardiovascular:     Rate and Rhythm: Normal rate and regular rhythm.     Heart sounds: Normal heart sounds. No murmur heard.  No friction rub. No gallop.   Pulmonary:     Effort:  Pulmonary effort is normal. No respiratory distress.     Breath sounds: Normal breath sounds. No stridor. No wheezing or rales.  Chest:     Chest wall: No tenderness.  Abdominal:     General: Bowel sounds are normal. There is no distension.     Palpations: Abdomen is soft. There is no mass.     Tenderness: There is no abdominal tenderness. There is no guarding or rebound.  Musculoskeletal:        General: No tenderness or deformity. Normal range of motion.     Cervical back: Normal range of motion and neck supple.  Lymphadenopathy:     Cervical: No cervical adenopathy.  Skin:    General: Skin is warm.     Coloration: Skin is not pale.     Findings: No erythema or rash.  Neurological:     Mental Status: He is alert and oriented to person, place, and time.     Cranial Nerves: No cranial nerve deficit.     Motor: No abnormal muscle tone.     Coordination: Coordination normal.     Deep Tendon Reflexes: Reflexes are normal and symmetric.  Psychiatric:        Behavior: Behavior normal.        Thought Content: Thought content normal.        Judgment: Judgment normal.         Assessment & Plan:  Controlled type 2 diabetes mellitus without complication, without long-term current use of insulin (HCC) - Plan: Hemoglobin A1c, CBC with Differential/Platelet, COMPLETE METABOLIC PANEL WITH GFR, Lipid panel  Essential hypertension  Routine general medical examination at a health care facility  Prostate  cancer screening - Plan: PSA  Pure hypercholesterolemia  Adenomatous polyp of colon, unspecified part of colon - Plan: Ambulatory referral to Gastroenterology  Need for immunization against influenza - Plan: Flu Vaccine QUAD 36+ mos IM  Blood pressure looks excellent. Physical exam is normal. I will check an A1c. I suspect A1c will still be greater than 7. If less than 7 we will make no changes. If greater than 7, I would recommend starting the patient on insulin and discontinuing many  of his oral medications. This would likely also be cheaper for him once he turns 59. Screen for prostate cancer with a PSA. Consult gastroenterology for colonoscopy. He received his flu shot today. The remainder of his immunizations are up-to-date. Check a fasting lipid panel. Goal LDL cholesterol is less than 100. Diabetic foot exam was performed today and was normal

## 2020-03-10 LAB — CBC WITH DIFFERENTIAL/PLATELET
Absolute Monocytes: 585 cells/uL (ref 200–950)
Basophils Absolute: 146 cells/uL (ref 0–200)
Basophils Relative: 1.7 %
Eosinophils Absolute: 120 cells/uL (ref 15–500)
Eosinophils Relative: 1.4 %
HCT: 43.9 % (ref 38.5–50.0)
Hemoglobin: 15.6 g/dL (ref 13.2–17.1)
Lymphs Abs: 2288 cells/uL (ref 850–3900)
MCH: 31.5 pg (ref 27.0–33.0)
MCHC: 35.5 g/dL (ref 32.0–36.0)
MCV: 88.5 fL (ref 80.0–100.0)
MPV: 11.1 fL (ref 7.5–12.5)
Monocytes Relative: 6.8 %
Neutro Abs: 5461 cells/uL (ref 1500–7800)
Neutrophils Relative %: 63.5 %
Platelets: 198 10*3/uL (ref 140–400)
RBC: 4.96 10*6/uL (ref 4.20–5.80)
RDW: 13.4 % (ref 11.0–15.0)
Total Lymphocyte: 26.6 %
WBC: 8.6 10*3/uL (ref 3.8–10.8)

## 2020-03-10 LAB — PSA: PSA: 0.34 ng/mL (ref <=4.0)

## 2020-03-10 LAB — LIPID PANEL
Cholesterol: 140 mg/dL (ref <200)
HDL: 38 mg/dL — ABNORMAL LOW (ref >=40)
LDL Cholesterol (Calc): 71 mg/dL (calc)
Non-HDL Cholesterol (Calc): 102 mg/dL (calc) (ref <130)
Total CHOL/HDL Ratio: 3.7 (calc) (ref <5.0)
Triglycerides: 211 mg/dL — ABNORMAL HIGH (ref <150)

## 2020-03-10 LAB — COMPLETE METABOLIC PANEL WITH GFR
AG Ratio: 1.2 (calc) (ref 1.0–2.5)
ALT: 32 U/L (ref 9–46)
AST: 43 U/L — ABNORMAL HIGH (ref 10–35)
Albumin: 4.4 g/dL (ref 3.6–5.1)
Alkaline phosphatase (APISO): 56 U/L (ref 35–144)
BUN: 20 mg/dL (ref 7–25)
CO2: 21 mmol/L (ref 20–32)
Calcium: 9.9 mg/dL (ref 8.6–10.3)
Chloride: 99 mmol/L (ref 98–110)
Creat: 0.84 mg/dL (ref 0.70–1.25)
GFR, Est African American: 107 mL/min/{1.73_m2} (ref >=60)
GFR, Est Non African American: 93 mL/min/{1.73_m2} (ref >=60)
Globulin: 3.6 g/dL (calc) (ref 1.9–3.7)
Glucose, Bld: 99 mg/dL (ref 65–99)
Potassium: 4 mmol/L (ref 3.5–5.3)
Sodium: 138 mmol/L (ref 135–146)
Total Bilirubin: 0.8 mg/dL (ref 0.2–1.2)
Total Protein: 8 g/dL (ref 6.1–8.1)

## 2020-03-10 LAB — HEMOGLOBIN A1C
Hgb A1c MFr Bld: 7.3 % of total Hgb — ABNORMAL HIGH (ref <5.7)
Mean Plasma Glucose: 163 (calc)
eAG (mmol/L): 9 (calc)

## 2020-03-13 ENCOUNTER — Encounter (INDEPENDENT_AMBULATORY_CARE_PROVIDER_SITE_OTHER): Payer: Self-pay | Admitting: *Deleted

## 2020-04-24 ENCOUNTER — Other Ambulatory Visit: Payer: Self-pay | Admitting: Family Medicine

## 2020-05-18 ENCOUNTER — Encounter: Payer: Self-pay | Admitting: Internal Medicine

## 2020-05-18 ENCOUNTER — Other Ambulatory Visit: Payer: Self-pay | Admitting: Family Medicine

## 2020-05-19 ENCOUNTER — Ambulatory Visit (AMBULATORY_SURGERY_CENTER): Payer: BC Managed Care – PPO | Admitting: *Deleted

## 2020-05-19 ENCOUNTER — Other Ambulatory Visit: Payer: Self-pay

## 2020-05-19 VITALS — Ht 73.0 in | Wt 230.0 lb

## 2020-05-19 DIAGNOSIS — Z86010 Personal history of colonic polyps: Secondary | ICD-10-CM

## 2020-05-19 MED ORDER — SUTAB 1479-225-188 MG PO TABS: 1.0000 | ORAL_TABLET | ORAL | 0 refills | Status: DC

## 2020-05-19 NOTE — Progress Notes (Signed)
Patient's pre-visit was done today over the phone with the patient due to COVID-19 pandemic. Name,DOB and address verified. Insurance verified. Patient denies any allergies to Eggs and Soy. Patient denies any problems with anesthesia/sedation. Patient denies taking diet pills or blood thinners. Packet of Prep instructions mailed to patient including a copy of a consent form and pre-procedure patient acknowledgement form (with envelope to mail back to us)-pt is aware. [employer] Coupon included. Patient understands to call us back with any questions or concerns. COVID-19 vaccines completed x2 and + covid test about 2 weeks ago per patient. patient denies any covid symptoms at this time. Patient is aware of our care-partner policy and [id number] safety protocol.

## 2020-06-13 ENCOUNTER — Encounter: Payer: Self-pay | Admitting: Internal Medicine

## 2020-06-15 ENCOUNTER — Encounter: Payer: Self-pay | Admitting: Internal Medicine

## 2020-06-15 ENCOUNTER — Ambulatory Visit (AMBULATORY_SURGERY_CENTER): Payer: BC Managed Care – PPO | Admitting: Internal Medicine

## 2020-06-15 ENCOUNTER — Other Ambulatory Visit: Payer: Self-pay

## 2020-06-15 VITALS — BP 139/90 | HR 76 | Temp 97.9°F | Resp 20 | Ht 73.0 in | Wt 230.0 lb

## 2020-06-15 DIAGNOSIS — Z86010 Personal history of colonic polyps: Secondary | ICD-10-CM | POA: Diagnosis not present

## 2020-06-15 DIAGNOSIS — Z1211 Encounter for screening for malignant neoplasm of colon: Secondary | ICD-10-CM | POA: Diagnosis not present

## 2020-06-15 DIAGNOSIS — D123 Benign neoplasm of transverse colon: Secondary | ICD-10-CM

## 2020-06-15 DIAGNOSIS — D124 Benign neoplasm of descending colon: Secondary | ICD-10-CM

## 2020-06-15 MED ORDER — SODIUM CHLORIDE 0.9 % IV SOLN: 500.0000 mL | Freq: Once | INTRAVENOUS | Status: DC

## 2020-06-15 NOTE — Progress Notes (Signed)
Vitals by CW 

## 2020-06-15 NOTE — Progress Notes (Signed)
Called to room to assist during endoscopic procedure.  Patient ID and intended procedure confirmed with present staff. Received instructions for my participation in the procedure from the performing physician.  

## 2020-06-15 NOTE — Patient Instructions (Signed)
Handout on polyps given to you today  Await pathology results   YOU HAD AN ENDOSCOPIC PROCEDURE TODAY AT THE [facility]:   Refer to the procedure report that was given to you for any specific questions about what was found during the examination.  If the procedure report does not answer your questions, please call your gastroenterologist to clarify.  If you requested that your care partner not be given the details of your procedure findings, then the procedure report has been included in a sealed envelope for you to review at your convenience later.  YOU SHOULD EXPECT: Some feelings of bloating in the abdomen. Passage of more gas than usual.  Walking can help get rid of the air that was put into your GI tract during the procedure and reduce the bloating. If you had a lower endoscopy (such as a colonoscopy or flexible sigmoidoscopy) you may notice spotting of blood in your stool or on the toilet paper. If you underwent a bowel prep for your procedure, you may not have a normal bowel movement for a few days.  Please Note:  You might notice some irritation and congestion in your nose or some drainage.  This is from the oxygen used during your procedure.  There is no need for concern and it should clear up in a day or so.  SYMPTOMS TO REPORT IMMEDIATELY:   Following lower endoscopy (colonoscopy or flexible sigmoidoscopy):  Excessive amounts of blood in the stool  Significant tenderness or worsening of abdominal pains  Swelling of the abdomen that is new, acute  Fever of 100F or higher  For urgent or emergent issues, a gastroenterologist can be reached at any hour by calling [phone]. Do not use MyChart messaging for urgent concerns.    DIET:  We do recommend a small meal at first, but then you may proceed to your regular diet.  Drink plenty of fluids but you should avoid alcoholic beverages for 24 hours.  ACTIVITY:  You should plan to take it easy for the rest of today and  you should NOT DRIVE or use heavy machinery until tomorrow (because of the sedation medicines used during the test).    FOLLOW UP: Our staff will call the number listed on your records 48-72 hours following your procedure to check on you and address any questions or concerns that you may have regarding the information given to you following your procedure. If we do not reach you, we will leave a message.  We will attempt to reach you two times.  During this call, we will ask if you have developed any symptoms of COVID 19. If you develop any symptoms (ie: fever, flu-like symptoms, shortness of breath, cough etc.) before then, please call [phone].  If you test positive for Covid 19 in the 2 weeks post procedure, please call and report this information to us.    If any biopsies were taken you will be contacted by phone or by letter within the next 1-3 weeks.  Please call us at [phone] if you have not heard about the biopsies in 3 weeks.    SIGNATURES/CONFIDENTIALITY: You and/or your care partner have signed paperwork which will be entered into your electronic medical record.  These signatures attest to the fact that that the information above on your After Visit Summary has been reviewed and is understood.  Full responsibility of the confidentiality of this discharge information lies with you and/or your care-partner. 

## 2020-06-15 NOTE — Progress Notes (Signed)
A/ox3, pleased with MAC, report to RN 

## 2020-06-15 NOTE — Op Note (Signed)
[place] Patient Name: Kristy Catoe Procedure Date: 06/15/2020 3:59 PM MRN: [id number] Endoscopist: Jerene Bears , MD Age: 65 Referring MD:  Date of Birth: Oct 04, 1955 Gender: Male Account #: [id number] Procedure:                Colonoscopy Indications:              High risk colon cancer surveillance: Personal                            history of multiple (6) adenomas on last                            colonoscopy: December 2018 Medicines:                Monitored Anesthesia Care Procedure:                Pre-Anesthesia Assessment:                           - Prior to the procedure, a History and Physical                            was performed, and patient medications and                            allergies were reviewed. The patient's tolerance of                            previous anesthesia was also reviewed. The risks                            and benefits of the procedure and the sedation                            options and risks were discussed with the patient.                            All questions were answered, and informed consent                            was obtained. Prior Anticoagulants: The patient has                            taken no previous anticoagulant or antiplatelet                            agents. ASA Grade Assessment: II - A patient with                            mild systemic disease. After reviewing the risks                            and benefits, the patient was deemed in  satisfactory condition to undergo the procedure.                           After obtaining informed consent, the colonoscope                            was passed under direct vision. Throughout the                            procedure, the patient's blood pressure, pulse, and                            oxygen saturations were monitored continuously. The                            Olympus [id number]) Colonoscope was                             introduced through the anus and advanced to the                            cecum, identified by appendiceal orifice and                            ileocecal valve. The colonoscopy was performed                            without difficulty. The patient tolerated the                            procedure well. The quality of the bowel                            preparation was good. The ileocecal valve,                            appendiceal orifice, and rectum were photographed. Scope In: 4:16:16 PM Scope Out: 4:37:04 PM Scope Withdrawal Time: 0 hours 18 minutes 58 seconds  Total Procedure Duration: 0 hours 20 minutes 48 seconds  Findings:                 The digital rectal exam was normal.                           A 8 mm polyp was found in the transverse colon. The                            polyp was sessile. The polyp was removed with a                            cold snare. Resection and retrieval were complete.                           A 4 mm polyp was found in the descending colon.  The                            polyp was sessile. The polyp was removed with a                            cold snare. Resection and retrieval were complete.                           The retroflexed view of the distal rectum and anal                            verge was normal and showed no anal or rectal                            abnormalities. Complications:            No immediate complications. Estimated Blood Loss:     Estimated blood loss was minimal. Impression:               - One 8 mm polyp in the transverse colon, removed                            with a cold snare. Resected and retrieved.                           - One 4 mm polyp in the descending colon, removed                            with a cold snare. Resected and retrieved.                           - The distal rectum and anal verge are normal on                            retroflexion view. Recommendation:            - Patient has a contact number available for                            emergencies. The signs and symptoms of potential                            delayed complications were discussed with the                            patient. Return to normal activities tomorrow.                            Written discharge instructions were provided to the                            patient.                           -  Resume previous diet.                           - Continue present medications.                           - Await pathology results.                           - Repeat colonoscopy is recommended for                            surveillance. The colonoscopy date will be                            determined after pathology results from today's                            exam become available for review. Jerene Bears, MD 06/15/2020 4:40:41 PM This report has been signed electronically.

## 2020-06-19 ENCOUNTER — Telehealth: Payer: Self-pay | Admitting: *Deleted

## 2020-06-19 NOTE — Telephone Encounter (Signed)
  Follow up Call-  Call back number 06/15/2020  Post procedure Call Back phone  # [phone]  Permission to leave phone message Yes  Some recent data might be hidden     Patient questions:  Do you have a fever, pain , or abdominal swelling? No. Pain Score  0 *  Have you tolerated food without any problems? Yes.    Have you been able to return to your normal activities? Yes.    Do you have any questions about your discharge instructions: Diet   No. Medications  No. Follow up visit  No.  Do you have questions or concerns about your Care? No.  Actions: * If pain score is 4 or above: 1. No action needed, pain <4.Have you developed a fever since your procedure? no  2.   Have you had an respiratory symptoms (SOB or cough) since your procedure? no  3.   Have you tested positive for COVID 19 since your procedure no  4.   Have you had any family members/close contacts diagnosed with the COVID 19 since your procedure?  no   If yes to any of these questions please route to Joylene John, RN and Joella Prince, RN

## 2020-06-23 ENCOUNTER — Encounter: Payer: Self-pay | Admitting: Internal Medicine

## 2020-06-26 ENCOUNTER — Other Ambulatory Visit: Payer: Self-pay | Admitting: Family Medicine

## 2020-06-27 ENCOUNTER — Other Ambulatory Visit: Payer: Self-pay | Admitting: Family Medicine

## 2020-07-14 ENCOUNTER — Encounter: Payer: Self-pay | Admitting: Family Medicine

## 2020-07-14 ENCOUNTER — Ambulatory Visit: Payer: BC Managed Care – PPO | Admitting: Family Medicine

## 2020-07-14 ENCOUNTER — Other Ambulatory Visit: Payer: Self-pay

## 2020-07-14 VITALS — BP 144/82 | HR 88 | Temp 97.9°F | Resp 16 | Ht 73.0 in | Wt 236.0 lb

## 2020-07-14 DIAGNOSIS — D045 Carcinoma in situ of skin of trunk: Secondary | ICD-10-CM | POA: Diagnosis not present

## 2020-07-14 DIAGNOSIS — E119 Type 2 diabetes mellitus without complications: Secondary | ICD-10-CM

## 2020-07-14 DIAGNOSIS — L989 Disorder of the skin and subcutaneous tissue, unspecified: Secondary | ICD-10-CM

## 2020-07-14 DIAGNOSIS — D049 Carcinoma in situ of skin, unspecified: Secondary | ICD-10-CM | POA: Diagnosis not present

## 2020-07-14 NOTE — Progress Notes (Signed)
Subjective:    Patient ID: Gerald [name], male    DOB: [birth year], 65 y.o.   MRN: [id number]  HPI   Patient presents with 2 concerns today.  First there is a skin lesion on his left flank just posterior to the mid axillary line around the level of the 10th rib.  It is a hard pink papule with white scale.  It is firm and tender to palpation.  It appears to be either a very inflamed seborrheic keratosis although I cannot rule out skin cancer.  He is requesting that I remove it as it is frequently irritated and hung up in his clothing.  Second issue is cost.  In 2 months, he will not be able to afford [place] and Trulicity.  He is interested in cheaper options.  His last A1c was 7.3 however that includes taking Actos, Metformin, Farxiga, and Trulicity. Past Medical History:  Diagnosis Date  . Abnormal LFTs (liver function tests) 12/2012  . Allergy   . Anal condyloma   . Anemia 07/2010  . Diabetes mellitus   . Gastric ulcer 07/2010   bleeding GU, clipped and injected with epi. non-bleeding ulcer at [place]. biopsy negative for H pylori.  on follow up EGD 08/2010, ulcers totally heled  . GERD with stricture   . Hyperlipemia   . Unspecified essential hypertension   . Upper GI bleed    Past Surgical History:  Procedure Laterality Date  . COLONOSCOPY  10/2010, 04/09/2017   sessile polyp (path: tubular adenoma) in tranverse colon. Dr Deatra Ina  . ESOPHAGOGASTRODUODENOSCOPY  07/24/2010   for hematemesis.  bleeding GU with VV, injected with epi and endoclipped.  second, non-bleeding ulcer at [facility]. biopsies negative for h pylori or dysplasia  . ESOPHAGOGASTRODUODENOSCOPY  08/2010   to assess the gastric ulcer.  study was normal.  no residual ulcers.  Dr Deatra Ina  . ESOPHAGOGASTRODUODENOSCOPY N/A 07/10/2017   Procedure: ESOPHAGOGASTRODUODENOSCOPY (EGD);  Surgeon: Jerene Bears, MD;  Location: [facility];  Service: Gastroenterology;  Laterality: N/A;  . ESOPHAGOGASTRODUODENOSCOPY (EGD) WITH  PROPOFOL N/A 03/08/2016   Procedure: ESOPHAGOGASTRODUODENOSCOPY (EGD) WITH PROPOFOL;  Surgeon: Jerene Bears, MD;  Location: [facility];  Service: Gastroenterology;  Laterality: N/A;  . GANGLION CYST EXCISION    . POLYPECTOMY    . UPPER GASTROINTESTINAL ENDOSCOPY     Current Outpatient Medications on File Prior to Visit  Medication Sig Dispense Refill  . aspirin EC 81 MG tablet Take 81 mg by mouth at bedtime.    . clotrimazole (LOTRIMIN) 1 % cream Apply 1 application topically 2 (two) times daily. 30 g 1  . Dulaglutide (TRULICITY) 1.5 ZH/0.8MV SOPN Inject 1.5 mg into the skin once a week. 6 mL 3  . FARXIGA 5 MG TABS tablet TAKE 1 TABLET DAILY 90 tablet 3  . glipiZIDE (GLUCOTROL XL) 10 MG 24 hr tablet TAKE 1 TABLET DAILY 90 tablet 3  . glucose blood (ONETOUCH VERIO) test strip USE TO CHECK BLOOD SUGAR 3 TIMES DAILY.ICD10:R73.01 100 each 3  . hydrochlorothiazide (HYDRODIURIL) 25 MG tablet TAKE 1 TABLET DAILY 90 tablet 3  . Lancets (ONETOUCH ULTRASOFT) lancets USE TO CHECK BLOOD SUGAR 3 TIMES DAILY.ICD10:R73.01 100 each 12  . losartan (COZAAR) 100 MG tablet TAKE 1 TABLET DAILY 90 tablet 3  . metFORMIN (GLUCOPHAGE) 1000 MG tablet TAKE 1 TABLET TWICE A DAY (NEED OFFICE VISIT AND LABS BEFORE FURTHER REFILLS) 180 tablet 3  . Misc Natural Products (TRIPLE FLEX) CAPS Take 1 capsule by mouth.    Marland Kitchen  montelukast (SINGULAIR) 10 MG tablet TAKE 1 TABLET AT BEDTIME 90 tablet 3  . Multiple Vitamin (MULTIVITAMIN PO) Take by mouth.    . naproxen sodium (ANAPROX) 220 MG tablet Take 440 mg by mouth 2 (two) times daily with a meal.    . omeprazole (PRILOSEC) 40 MG capsule TAKE 1 CAPSULE BY MOUTH DAILY 90 capsule 0  . pioglitazone (ACTOS) 30 MG tablet Take 1 tablet (30 mg total) by mouth daily. 30 tablet 5  . pravastatin (PRAVACHOL) 20 MG tablet TAKE 1 TABLET DAILY 90 tablet 3  . sildenafil (VIAGRA) 100 MG tablet TAKE ONE-HALF (1/2) TO ONE TABLET DAILY AS NEEDED FOR ERECTILE DYSFUNCTION 10 tablet 13   No current  facility-administered medications on file prior to visit.   No Known Allergies Social History   Socioeconomic History  . Marital status: Married    Spouse name: Not on file  . Number of children: Not on file  . Years of education: Not on file  . Highest education level: Not on file  Occupational History  . Occupation: Lexicographer: [employer]  Tobacco Use  . Smoking status: Former Smoker    Quit date: 07/24/2010    Years since quitting: 9.9  . Smokeless tobacco: Former Systems developer    Types: Secondary school teacher  . Vaping Use: Never used  Substance and Sexual Activity  . Alcohol use: Yes    Alcohol/week: 2.0 standard drinks    Types: 2 Standard drinks or equivalent per week  . Drug use: No  . Sexual activity: Not on file  Other Topics Concern  . Not on file  Social History Narrative  . Not on file   Social Determinants of Health   Financial Resource Strain: Not on file  Food Insecurity: Not on file  Transportation Needs: Not on file  Physical Activity: Not on file  Stress: Not on file  Social Connections: Not on file  Intimate Partner Violence: Not on file   Family History  Problem Relation Age of Onset  . Diabetes Brother   . Stomach cancer Mother   . Colon cancer Neg Hx   . Colon polyps Neg Hx   . Esophageal cancer Neg Hx   . Rectal cancer Neg Hx       Review of Systems  All other systems reviewed and are negative.      Objective:   Physical Exam Vitals reviewed.  Constitutional:      General: He is not in acute distress.    Appearance: He is well-developed. He is not diaphoretic.  HENT:     Head: Normocephalic and atraumatic.     Right Ear: External ear normal.     Left Ear: External ear normal.     Nose: Nose normal.     Mouth/Throat:     Pharynx: No oropharyngeal exudate.  Eyes:     General: No scleral icterus.       Right eye: No discharge.        Left eye: No discharge.     Conjunctiva/sclera: Conjunctivae normal.     Pupils: Pupils are  equal, round, and reactive to light.  Neck:     Thyroid: No thyromegaly.     Vascular: No JVD.     Trachea: No tracheal deviation.  Cardiovascular:     Rate and Rhythm: Normal rate and regular rhythm.     Heart sounds: Normal heart sounds. No murmur heard. No friction rub. No gallop.   Pulmonary:  Effort: Pulmonary effort is normal. No respiratory distress.     Breath sounds: Normal breath sounds. No stridor. No wheezing or rales.  Chest:     Chest wall: No tenderness.  Abdominal:     General: Bowel sounds are normal. There is no distension.     Palpations: Abdomen is soft. There is no mass.     Tenderness: There is no abdominal tenderness. There is no guarding or rebound.  Musculoskeletal:        General: No tenderness or deformity. Normal range of motion.     Cervical back: Normal range of motion and neck supple.  Lymphadenopathy:     Cervical: No cervical adenopathy.  Skin:    General: Skin is warm.     Coloration: Skin is not pale.     Findings: No erythema or rash.  Neurological:     Mental Status: He is alert and oriented to person, place, and time.     Cranial Nerves: No cranial nerve deficit.     Motor: No abnormal muscle tone.     Coordination: Coordination normal.     Deep Tendon Reflexes: Reflexes are normal and symmetric.  Psychiatric:        Behavior: Behavior normal.        Thought Content: Thought content normal.        Judgment: Judgment normal.   6 mm erythematous hard papule with white scale      Assessment & Plan:  Controlled type 2 diabetes mellitus without complication, without long-term current use of insulin (HCC) - Plan: Hemoglobin A1c, COMPLETE METABOLIC PANEL WITH GFR, Lipid panel  Skin lesion of back - Plan: Surgical pathology  Using sterile technique, I anesthetized the lesion on his left flank with 0.1% lidocaine with epinephrine.  I remove the lesion using a shave biopsy technique.  The lesion was sent to pathology in a labeled  container.  Hemostasis was achieved with Drysol and a Band-Aid.  Regarding his diabetes, I would recommend discontinuing Trulicity and Farxiga in 2 months when his current prescriptions expire.  I would then start him on Basaglar 15 units a day and increase by 1 unit daily until fasting blood sugars are less than 130.  He will call me back when he is ready for me to call out the [place].

## 2020-07-14 NOTE — Addendum Note (Signed)
Addended by: Sheral Flow on: 07/14/2020 12:03 PM   Modules accepted: Orders

## 2020-07-18 ENCOUNTER — Other Ambulatory Visit: Payer: Self-pay | Admitting: Family Medicine

## 2020-07-18 LAB — PATHOLOGY REPORT

## 2020-07-18 LAB — TISSUE SPECIMEN

## 2020-09-19 ENCOUNTER — Telehealth: Payer: Self-pay | Admitting: Family Medicine

## 2020-09-19 ENCOUNTER — Other Ambulatory Visit: Payer: Self-pay

## 2020-09-19 MED ORDER — GLIPIZIDE ER 10 MG PO TB24: 10.0000 mg | ORAL_TABLET | Freq: Every day | ORAL | 3 refills | Status: DC

## 2020-09-19 NOTE — Telephone Encounter (Signed)
Received call from spouse Mardene Celeste to request refill of glipiZIDE (GLUCOTROL XL) 10 MG 24 hr tablet [[id number]]   Pharmacy confirmed as  [facility] [address] off [address] in [place], [place].    Please call Mardene Celeste at [phone], or [phone] when Rx called in .

## 2020-10-26 ENCOUNTER — Other Ambulatory Visit: Payer: Self-pay | Admitting: Family Medicine

## 2020-10-26 MED ORDER — MONTELUKAST SODIUM 10 MG PO TABS: 10.0000 mg | ORAL_TABLET | Freq: Every day | ORAL | 3 refills | Status: DC

## 2020-10-26 MED ORDER — OMEPRAZOLE 40 MG PO CPDR: 40.0000 mg | DELAYED_RELEASE_CAPSULE | Freq: Every day | ORAL | 3 refills | Status: DC

## 2020-10-26 NOTE — Telephone Encounter (Signed)
Patient requesting singular and omeprazole  refill and she would like both [facility] into [facility] on [address]  CB# [phone]

## 2020-10-26 NOTE — Addendum Note (Signed)
Addended by: Sheral Flow on: 10/26/2020 01:24 PM   Modules accepted: Orders

## 2020-11-24 ENCOUNTER — Other Ambulatory Visit: Payer: Self-pay | Admitting: *Deleted

## 2020-11-24 MED ORDER — DAPAGLIFLOZIN PROPANEDIOL 5 MG PO TABS: 5.0000 mg | ORAL_TABLET | Freq: Every day | ORAL | 3 refills | Status: DC

## 2020-11-24 MED ORDER — HYDROCHLOROTHIAZIDE 25 MG PO TABS: 25.0000 mg | ORAL_TABLET | Freq: Every day | ORAL | 3 refills | Status: DC

## 2020-11-24 MED ORDER — METFORMIN HCL 1000 MG PO TABS: ORAL_TABLET | ORAL | 3 refills | Status: DC

## 2020-12-19 ENCOUNTER — Ambulatory Visit (INDEPENDENT_AMBULATORY_CARE_PROVIDER_SITE_OTHER): Payer: Medicare HMO | Admitting: Family Medicine

## 2020-12-19 ENCOUNTER — Other Ambulatory Visit: Payer: Self-pay

## 2020-12-19 VITALS — BP 140/82 | HR 84 | Temp 98.0°F | Resp 14 | Ht 73.0 in | Wt 235.0 lb

## 2020-12-19 DIAGNOSIS — E119 Type 2 diabetes mellitus without complications: Secondary | ICD-10-CM | POA: Diagnosis not present

## 2020-12-19 DIAGNOSIS — I10 Essential (primary) hypertension: Secondary | ICD-10-CM

## 2020-12-19 DIAGNOSIS — E7800 Pure hypercholesterolemia, unspecified: Secondary | ICD-10-CM

## 2020-12-19 MED ORDER — SILDENAFIL CITRATE 100 MG PO TABS: ORAL_TABLET | ORAL | 3 refills | Status: DC

## 2020-12-19 MED ORDER — PRAVASTATIN SODIUM 20 MG PO TABS: 20.0000 mg | ORAL_TABLET | Freq: Every day | ORAL | 3 refills | Status: DC

## 2020-12-19 MED ORDER — METFORMIN HCL 1000 MG PO TABS: ORAL_TABLET | ORAL | 3 refills | Status: DC

## 2020-12-19 MED ORDER — MONTELUKAST SODIUM 10 MG PO TABS: 10.0000 mg | ORAL_TABLET | Freq: Every day | ORAL | 3 refills | Status: DC

## 2020-12-19 MED ORDER — LOSARTAN POTASSIUM 100 MG PO TABS: 100.0000 mg | ORAL_TABLET | Freq: Every day | ORAL | 3 refills | Status: DC

## 2020-12-19 MED ORDER — DAPAGLIFLOZIN PROPANEDIOL 5 MG PO TABS: 5.0000 mg | ORAL_TABLET | Freq: Every day | ORAL | 3 refills | Status: DC

## 2020-12-19 NOTE — Progress Notes (Signed)
Subjective:    Patient ID: Gerald [name], male    DOB: 1955/10/25, 65 y.o.   MRN: [id number]  HPI  Patient is a very pleasant 65 year old Caucasian gentleman here today to discuss his medications for diabetes.  Now that he is on Medicare, he is concerned that the [place] and the Trulicity will be too expensive.  He is interested in switching to insulin to replace those.  We had a long discussion today regarding the implications of insulin.  He admits that he does not check his sugar as frequently as he should.  He also admits to skipping meals and often going until 3:00 in the afternoon before he eats.  I explained to him that with insulin, he would have to check his blood sugar 2-3 times a day in order to make adjustments in his insulin in Gerald he was having lower sugars.  Also explained to him that he would have to eat consistently to avoid hypoglycemic episodes.  He admits that he would likely not do these things which would put him at risk for hypoglycemia.  He denies any chest pain shortness of breath or dyspnea on exertion. Past Medical History:  Diagnosis Date   Abnormal LFTs (liver function tests) 12/2012   Allergy    Anal condyloma    Anemia 07/2010   Diabetes mellitus    Gastric ulcer 07/2010   bleeding GU, clipped and injected with epi. non-bleeding ulcer at [place]. biopsy negative for H pylori.  on follow up EGD 08/2010, ulcers totally heled   GERD with stricture    Hyperlipemia    Unspecified essential hypertension    Upper GI bleed    Past Surgical History:  Procedure Laterality Date   COLONOSCOPY  10/2010, 04/09/2017   sessile polyp (path: tubular adenoma) in tranverse colon. Dr Deatra Ina   ESOPHAGOGASTRODUODENOSCOPY  07/24/2010   for hematemesis.  bleeding GU with VV, injected with epi and endoclipped.  second, non-bleeding ulcer at [facility]. biopsies negative for h pylori or dysplasia   ESOPHAGOGASTRODUODENOSCOPY  08/2010   to assess the gastric ulcer.  study was normal.   no residual ulcers.  Dr Deatra Ina   ESOPHAGOGASTRODUODENOSCOPY N/A 07/10/2017   Procedure: ESOPHAGOGASTRODUODENOSCOPY (EGD);  Surgeon: Jerene Bears, MD;  Location: [facility];  Service: Gastroenterology;  Laterality: N/A;   ESOPHAGOGASTRODUODENOSCOPY (EGD) WITH PROPOFOL N/A 03/08/2016   Procedure: ESOPHAGOGASTRODUODENOSCOPY (EGD) WITH PROPOFOL;  Surgeon: Jerene Bears, MD;  Location: [facility];  Service: Gastroenterology;  Laterality: N/A;   GANGLION CYST EXCISION     POLYPECTOMY     UPPER GASTROINTESTINAL ENDOSCOPY     Current Outpatient Medications on File Prior to Visit  Medication Sig Dispense Refill   aspirin EC 81 MG tablet Take 81 mg by mouth at bedtime.     clotrimazole (LOTRIMIN) 1 % cream Apply 1 application topically 2 (two) times daily. 30 g 1   dapagliflozin propanediol (FARXIGA) 5 MG TABS tablet Take 1 tablet (5 mg total) by mouth daily. 90 tablet 3   Dulaglutide (TRULICITY) 1.5 0000000 SOPN Inject 1.5 mg into the skin once a week. 6 mL 3   glipiZIDE (GLUCOTROL XL) 10 MG 24 hr tablet Take 1 tablet (10 mg total) by mouth daily. 90 tablet 3   glucose blood (ONETOUCH VERIO) test strip USE TO CHECK BLOOD SUGAR 3 TIMES DAILY.ICD10:R73.01 100 each 3   hydrochlorothiazide (HYDRODIURIL) 25 MG tablet Take 1 tablet (25 mg total) by mouth daily. 90 tablet 3   Lancets (ONETOUCH  ULTRASOFT) lancets USE TO CHECK BLOOD SUGAR 3 TIMES DAILY.ICD10:R73.01 100 each 12   losartan (COZAAR) 100 MG tablet TAKE 1 TABLET DAILY 90 tablet 3   metFORMIN (GLUCOPHAGE) 1000 MG tablet TAKE 1 TABLET TWICE A DAY 180 tablet 3   Misc Natural Products (TRIPLE FLEX) CAPS Take 1 capsule by mouth.     montelukast (SINGULAIR) 10 MG tablet Take 1 tablet (10 mg total) by mouth at bedtime. 90 tablet 3   Multiple Vitamin (MULTIVITAMIN PO) Take by mouth.     naproxen sodium (ANAPROX) 220 MG tablet Take 440 mg by mouth 2 (two) times daily with a meal.     omeprazole (PRILOSEC) 40 MG capsule Take 1 capsule (40 mg total) by  mouth daily. 90 capsule 3   pioglitazone (ACTOS) 30 MG tablet Take 1 tablet (30 mg total) by mouth daily. 30 tablet 5   pravastatin (PRAVACHOL) 20 MG tablet TAKE 1 TABLET DAILY 90 tablet 3   sildenafil (VIAGRA) 100 MG tablet TAKE ONE-HALF (1/2) TO ONE TABLET DAILY AS NEEDED FOR ERECTILE DYSFUNCTION 10 tablet 13   No current facility-administered medications on file prior to visit.   No Known Allergies Social History   Socioeconomic History   Marital status: Married    Spouse name: Not on file   Number of children: Not on file   Years of education: Not on file   Highest education level: Not on file  Occupational History   Occupation: Welder     Employer: [employer]  Tobacco Use   Smoking status: Former   Smokeless tobacco: Former    Types: Nurse, children's Use: Never used  Substance and Sexual Activity   Alcohol use: Yes    Alcohol/week: 2.0 standard drinks    Types: 2 Standard drinks or equivalent per week   Drug use: No   Sexual activity: Not on file  Other Topics Concern   Not on file  Social History Narrative   Not on file   Social Determinants of Health   Financial Resource Strain: Not on file  Food Insecurity: Not on file  Transportation Needs: Not on file  Physical Activity: Not on file  Stress: Not on file  Social Connections: Not on file  Intimate Partner Violence: Not on file   Family History  Problem Relation Age of Onset   Diabetes Brother    Stomach cancer Mother    Colon cancer Neg Hx    Colon polyps Neg Hx    Esophageal cancer Neg Hx    Rectal cancer Neg Hx       Review of Systems  All other systems reviewed and are negative.     Objective:   Physical Exam Vitals reviewed.  Constitutional:      General: He is not in acute distress.    Appearance: He is well-developed. He is not diaphoretic.  HENT:     Head: Normocephalic and atraumatic.     Right Ear: External ear normal.     Left Ear: External ear normal.     Nose: Nose  normal.     Mouth/Throat:     Pharynx: No oropharyngeal exudate.  Eyes:     General: No scleral icterus.       Right eye: No discharge.        Left eye: No discharge.     Conjunctiva/sclera: Conjunctivae normal.     Pupils: Pupils are equal, round, and reactive to light.  Neck:  Thyroid: No thyromegaly.     Vascular: No JVD.     Trachea: No tracheal deviation.  Cardiovascular:     Rate and Rhythm: Normal rate and regular rhythm.     Heart sounds: Normal heart sounds. No murmur heard.   No friction rub. No gallop.  Pulmonary:     Effort: Pulmonary effort is normal. No respiratory distress.     Breath sounds: Normal breath sounds. No stridor. No wheezing or rales.  Chest:     Chest wall: No tenderness.  Abdominal:     General: Bowel sounds are normal. There is no distension.     Palpations: Abdomen is soft. There is no mass.     Tenderness: There is no abdominal tenderness. There is no guarding or rebound.  Musculoskeletal:        General: No tenderness or deformity. Normal range of motion.     Cervical back: Normal range of motion and neck supple.  Lymphadenopathy:     Cervical: No cervical adenopathy.  Skin:    General: Skin is warm.     Coloration: Skin is not pale.     Findings: No erythema or rash.  Neurological:     Mental Status: He is alert and oriented to person, place, and time.     Cranial Nerves: No cranial nerve deficit.     Motor: No abnormal muscle tone.     Coordination: Coordination normal.     Deep Tendon Reflexes: Reflexes are normal and symmetric.  Psychiatric:        Behavior: Behavior normal.        Thought Content: Thought content normal.        Judgment: Judgment normal.       Assessment & Plan:  Controlled type 2 diabetes mellitus without complication, without long-term current use of insulin (Gerald [name]) - Plan: CBC with Differential/Platelet, COMPLETE METABOLIC PANEL WITH GFR, Lipid panel, Microalbumin, urine, Hemoglobin A1c  Essential  hypertension - Plan: CBC with Differential/Platelet, COMPLETE METABOLIC PANEL WITH GFR, Lipid panel, Microalbumin, urine, Hemoglobin A1c  Pure hypercholesterolemia - Plan: CBC with Differential/Platelet, COMPLETE METABOLIC PANEL WITH GFR, Lipid panel, Microalbumin, urine, Hemoglobin A1c At the present time, his blood pressure is acceptable at 140/82.  I will check an A1c.  He states that his fasting sugars in the morning are 140-170.  His 2-hour postprandial sugars are 120-160.  Sounds like he would benefit increasing Farxiga to 10 mg a day.  I will check his A1c and if greater than 7 I will recommend doing that.  After discussing the implications of insulin, the patient elects to stay on Trulicity and Farxiga for the present time.  If his cost becomes prohibitive, we can always switch to insulin at a later date.

## 2020-12-20 LAB — COMPLETE METABOLIC PANEL WITH GFR
AG Ratio: 1.2 (calc) (ref 1.0–2.5)
ALT: 44 U/L (ref 9–46)
AST: 54 U/L — ABNORMAL HIGH (ref 10–35)
Albumin: 4.5 g/dL (ref 3.6–5.1)
Alkaline phosphatase (APISO): 60 U/L (ref 35–144)
BUN: 17 mg/dL (ref 7–25)
CO2: 24 mmol/L (ref 20–32)
Calcium: 9.8 mg/dL (ref 8.6–10.3)
Chloride: 97 mmol/L — ABNORMAL LOW (ref 98–110)
Creat: 0.87 mg/dL (ref 0.70–1.35)
Globulin: 3.7 g/dL (calc) (ref 1.9–3.7)
Glucose, Bld: 162 mg/dL — ABNORMAL HIGH (ref 65–99)
Potassium: 4.4 mmol/L (ref 3.5–5.3)
Sodium: 137 mmol/L (ref 135–146)
Total Bilirubin: 0.9 mg/dL (ref 0.2–1.2)
Total Protein: 8.2 g/dL — ABNORMAL HIGH (ref 6.1–8.1)
eGFR: 96 mL/min/{1.73_m2} (ref >=60)

## 2020-12-20 LAB — CBC WITH DIFFERENTIAL/PLATELET
Absolute Monocytes: 599 cells/uL (ref 200–950)
Basophils Absolute: 156 cells/uL (ref 0–200)
Basophils Relative: 1.9 %
Eosinophils Absolute: 107 cells/uL (ref 15–500)
Eosinophils Relative: 1.3 %
HCT: 44.7 % (ref 38.5–50.0)
Hemoglobin: 15.2 g/dL (ref 13.2–17.1)
Lymphs Abs: 1927 cells/uL (ref 850–3900)
MCH: 29.8 pg (ref 27.0–33.0)
MCHC: 34 g/dL (ref 32.0–36.0)
MCV: 87.6 fL (ref 80.0–100.0)
MPV: 11 fL (ref 7.5–12.5)
Monocytes Relative: 7.3 %
Neutro Abs: 5412 cells/uL (ref 1500–7800)
Neutrophils Relative %: 66 %
Platelets: 171 10*3/uL (ref 140–400)
RBC: 5.1 10*6/uL (ref 4.20–5.80)
RDW: 13.6 % (ref 11.0–15.0)
Total Lymphocyte: 23.5 %
WBC: 8.2 10*3/uL (ref 3.8–10.8)

## 2020-12-20 LAB — LIPID PANEL
Cholesterol: 148 mg/dL (ref <200)
HDL: 34 mg/dL — ABNORMAL LOW (ref >=40)
LDL Cholesterol (Calc): 83 mg/dL (calc)
Non-HDL Cholesterol (Calc): 114 mg/dL (calc) (ref <130)
Total CHOL/HDL Ratio: 4.4 (calc) (ref <5.0)
Triglycerides: 223 mg/dL — ABNORMAL HIGH (ref <150)

## 2020-12-20 LAB — MICROALBUMIN, URINE: Microalb, Ur: 1.1 mg/dL

## 2020-12-20 LAB — HEMOGLOBIN A1C
Hgb A1c MFr Bld: 8.6 % of total Hgb — ABNORMAL HIGH (ref <5.7)
Mean Plasma Glucose: 200 mg/dL
eAG (mmol/L): 11.1 mmol/L

## 2020-12-27 ENCOUNTER — Other Ambulatory Visit: Payer: Self-pay | Admitting: *Deleted

## 2020-12-27 MED ORDER — BASAGLAR KWIKPEN 100 UNIT/ML ~~LOC~~ SOPN: 10.0000 [IU] | PEN_INJECTOR | Freq: Every day | SUBCUTANEOUS | 1 refills | Status: DC

## 2020-12-27 MED ORDER — DAPAGLIFLOZIN PROPANEDIOL 10 MG PO TABS: 10.0000 mg | ORAL_TABLET | Freq: Every day | ORAL | 3 refills | Status: DC

## 2021-01-03 ENCOUNTER — Telehealth: Payer: Self-pay | Admitting: *Deleted

## 2021-01-03 NOTE — Telephone Encounter (Signed)
Received call from patient.   Reports that he is currently takingMetformin '1000mg'$   BID and Farxiga '10mg'$ . He has also been uptitrating Engineer, agricultural. Current dose is 14U SQ QD. FSBS this AM noted at 126.  Advised to continue current dose x1 week and call with CBG results in 1 week.

## 2021-01-16 ENCOUNTER — Other Ambulatory Visit: Payer: Self-pay | Admitting: *Deleted

## 2021-01-16 MED ORDER — INSULIN PEN NEEDLE 32G X 4 MM MISC: 1 refills | Status: DC

## 2021-01-23 ENCOUNTER — Other Ambulatory Visit: Payer: Self-pay | Admitting: *Deleted

## 2021-01-23 MED ORDER — ONETOUCH VERIO VI STRP: ORAL_STRIP | 3 refills | Status: DC

## 2021-01-31 ENCOUNTER — Ambulatory Visit (INDEPENDENT_AMBULATORY_CARE_PROVIDER_SITE_OTHER): Payer: Medicare HMO | Admitting: *Deleted

## 2021-01-31 ENCOUNTER — Other Ambulatory Visit: Payer: Self-pay

## 2021-01-31 DIAGNOSIS — Z23 Encounter for immunization: Secondary | ICD-10-CM

## 2021-02-23 ENCOUNTER — Other Ambulatory Visit: Payer: Self-pay | Admitting: *Deleted

## 2021-02-23 MED ORDER — DAPAGLIFLOZIN PROPANEDIOL 10 MG PO TABS: 10.0000 mg | ORAL_TABLET | Freq: Every day | ORAL | 3 refills | Status: DC

## 2021-03-14 ENCOUNTER — Other Ambulatory Visit: Payer: Self-pay

## 2021-03-14 ENCOUNTER — Telehealth (INDEPENDENT_AMBULATORY_CARE_PROVIDER_SITE_OTHER): Payer: Medicare HMO | Admitting: Nurse Practitioner

## 2021-03-14 ENCOUNTER — Ambulatory Visit
Admission: RE | Admit: 2021-03-14 | Discharge: 2021-03-14 | Disposition: A | Discharge status: Home / Self Care | Payer: Self-pay | Source: Ambulatory Visit | Attending: Nurse Practitioner | Admitting: Nurse Practitioner

## 2021-03-14 DIAGNOSIS — J019 Acute sinusitis, unspecified: Secondary | ICD-10-CM | POA: Diagnosis not present

## 2021-03-14 DIAGNOSIS — R051 Acute cough: Secondary | ICD-10-CM

## 2021-03-14 DIAGNOSIS — B9689 Other specified bacterial agents as the cause of diseases classified elsewhere: Secondary | ICD-10-CM | POA: Diagnosis not present

## 2021-03-14 DIAGNOSIS — R059 Cough, unspecified: Secondary | ICD-10-CM | POA: Diagnosis not present

## 2021-03-14 DIAGNOSIS — J90 Pleural effusion, not elsewhere classified: Secondary | ICD-10-CM | POA: Diagnosis not present

## 2021-03-14 MED ORDER — AMOXICILLIN-POT CLAVULANATE 875-125 MG PO TABS: 1.0000 | ORAL_TABLET | Freq: Two times a day (BID) | ORAL | 0 refills | Status: AC

## 2021-03-14 NOTE — Progress Notes (Signed)
Subjective:    Patient ID: Gerald [name], male    DOB: 15-Jul-1955, 65 y.o.   MRN: [id number]  HPI: Gerald [name] is a 65 y.o. male presenting virtually for cough x 2 weeks.  Chief Complaint  Patient presents with   Cough    UPPER RESPIRATORY TRACT INFECTION Onset: 2 weeks ago was in bed for about 1 week; symptoms improved then worsened COVID-19 testing history: negative this morning COVID-19 vaccination status: has had 2 COVID vaccines Fever: yes; 101 last week Cough:  yes;  clear Shortness of breath: yes Wheezing: no Chest pain: no Chest tightness:  yes; with cough Chest congestion: yes Nasal congestion: yes Runny nose: yes Post nasal drip: yes Sneezing: no Sore throat: yes Swollen glands: yes Sinus pressure:  yes; above eyes Headache: no Face pain: no Toothache: no Ear pain: no  Ear pressure: yes  Eyes red/itching:yes Eye drainage/crusting: no  Nausea: no  Vomiting: no Diarrhea: no  Change in appetite:  yes; decreased   Loss of taste/smell: no  Rash: no Fatigue:  yes Sick contacts: no Strep contacts: no  Context: worse and fluctuating Recurrent sinusitis: no Treatments attempted: all in one, Mucinex, Advil, pedialyte  No Known Allergies  Outpatient Encounter Medications as of 03/14/2021  Medication Sig   amoxicillin-clavulanate (AUGMENTIN) 875-125 MG tablet Take 1 tablet by mouth 2 (two) times daily for 7 days.   aspirin EC 81 MG tablet Take 81 mg by mouth at bedtime.   dapagliflozin propanediol (FARXIGA) 10 MG TABS tablet Take 1 tablet (10 mg total) by mouth daily.   glipiZIDE (GLUCOTROL XL) 10 MG 24 hr tablet Take 1 tablet (10 mg total) by mouth daily.   glucose blood (ONETOUCH VERIO) test strip USE TO CHECK BLOOD SUGAR 3 TIMES DAILY.ICD10:R73.01   hydrochlorothiazide (HYDRODIURIL) 25 MG tablet Take 1 tablet (25 mg total) by mouth daily.   Insulin Glargine (BASAGLAR KWIKPEN) 100 UNIT/ML Inject 10-20 Units into the skin daily.   Insulin  Pen Needle 32G X 4 MM MISC Use as directed to inject insulin SQ QD.   Lancets (ONETOUCH ULTRASOFT) lancets USE TO CHECK BLOOD SUGAR 3 TIMES DAILY.ICD10:R73.01   losartan (COZAAR) 100 MG tablet Take 1 tablet (100 mg total) by mouth daily.   metFORMIN (GLUCOPHAGE) 1000 MG tablet TAKE 1 TABLET TWICE A DAY   Misc Natural Products (TRIPLE FLEX) CAPS Take 1 capsule by mouth.   montelukast (SINGULAIR) 10 MG tablet Take 1 tablet (10 mg total) by mouth at bedtime.   Multiple Vitamin (MULTIVITAMIN PO) Take by mouth.   naproxen sodium (ANAPROX) 220 MG tablet Take 440 mg by mouth 2 (two) times daily with a meal.   omeprazole (PRILOSEC) 40 MG capsule Take 1 capsule (40 mg total) by mouth daily.   pravastatin (PRAVACHOL) 20 MG tablet Take 1 tablet (20 mg total) by mouth daily.   sildenafil (VIAGRA) 100 MG tablet TAKE ONE-HALF (1/2) TO ONE TABLET DAILY AS NEEDED FOR ERECTILE DYSFUNCTION   No facility-administered encounter medications on file as of 03/14/2021.    Patient Active Problem List   Diagnosis Date Noted   Food impaction of esophagus    Esophageal obstruction due to food impaction    Gastric ulcer with hemorrhage 09/13/2010   Special screening for malignant neoplasms, colon 09/13/2010    Past Medical History:  Diagnosis Date   Abnormal LFTs (liver function tests) 12/2012   Allergy    Anal condyloma    Anemia 07/2010   Diabetes mellitus  Gastric ulcer 07/2010   bleeding GU, clipped and injected with epi. non-bleeding ulcer at [place]. biopsy negative for H pylori.  on follow up EGD 08/2010, ulcers totally heled   GERD with stricture    Hyperlipemia    Unspecified essential hypertension    Upper GI bleed     Relevant past medical, surgical, family and social history reviewed and updated as indicated. Interim medical history since our last visit reviewed.  Review of Systems Per HPI unless specifically indicated above     Objective:    There were no vitals taken for this visit.   Wt Readings from Last 3 Encounters:  12/19/20 235 lb (106.6 kg)  07/14/20 236 lb (107 kg)  06/15/20 230 lb (104.3 kg)    Physical Exam Vitals and nursing note reviewed.  Constitutional:      General: He is not in acute distress.    Appearance: Normal appearance. He is not ill-appearing or toxic-appearing.  HENT:     Head: Normocephalic and atraumatic.     Right Ear: External ear normal.     Left Ear: External ear normal.     Nose: Congestion present. No rhinorrhea.     Mouth/Throat:     Mouth: Mucous membranes are moist.     Pharynx: Oropharynx is clear.  Eyes:     General: No scleral icterus.       Right eye: No discharge.        Left eye: No discharge.     Extraocular Movements: Extraocular movements intact.  Cardiovascular:     Comments: Unable to assess heart sounds via virtual visit.  Pulmonary:     Effort: Pulmonary effort is normal. No respiratory distress.     Comments: Unable to assess breath sounds via virtual visit.  Patient talking in complete sentences during telemedicine visit without accessory muscle use. Skin:    Coloration: Skin is not jaundiced or pale.     Findings: No erythema.  Neurological:     Mental Status: He is alert and oriented to person, place, and time.  Psychiatric:        Mood and Affect: Mood normal.        Behavior: Behavior normal.        Thought Content: Thought content normal.        Judgment: Judgment normal.      Assessment & Plan:  1. Acute cough Acute.  Concern with symptoms including productive cough, decreased appetite, ongoing fevers, fatigue.  Will check STAT chest x-ray to check for pneumonia.  If negative, treat with Augmentin for bacterial sinus infection.  If positive for pneumonia, will need treatment with Augmentin and zpack likely.  Follow up if symptoms do not improve or worsen.    - DG Chest 2 View; Future  2. Acute bacterial sinusitis Acute.  Start Augmentin twice daily for 7 days.  Continue to push fluids, can  use nasal rinses and/or flonase.  Follow up if symptoms persist or worsen.  - amoxicillin-clavulanate (AUGMENTIN) 875-125 MG tablet; Take 1 tablet by mouth 2 (two) times daily for 7 days.  Dispense: 14 tablet; Refill: 0  Follow up plan: Return if symptoms worsen or fail to improve.  Due to the catastrophic nature of the COVID-19 pandemic, this video visit was completed soley via audio and visual contact via Caregility due to the restrictions of the COVID-19 pandemic..  All issues as above were discussed and addressed. Physical exam was done as above through visual confirmation on Caregility.  If it was felt that the patient should be evaluated in the office, they were directed there. The patient verbally consented to this visit. Location of the patient: home Location of the provider: work Those involved with this call:  Provider: Noemi Chapel, DNP, FNP-C CMA: n/a Front Desk/Registration: Vevelyn Pat  Time spent on call:  12 minutes with patient face to face via video conference. More than 50% of this time was spent in counseling and coordination of care. 15 minutes total spent in review of patient's record and preparation of their chart. I verified patient identity using two factors (patient name and date of birth). Patient consents verbally to being seen via telemedicine visit today.

## 2021-03-20 ENCOUNTER — Encounter: Payer: Self-pay | Admitting: Nurse Practitioner

## 2021-03-20 DIAGNOSIS — Z7982 Long term (current) use of aspirin: Secondary | ICD-10-CM | POA: Diagnosis not present

## 2021-03-20 DIAGNOSIS — E785 Hyperlipidemia, unspecified: Secondary | ICD-10-CM | POA: Diagnosis not present

## 2021-03-20 DIAGNOSIS — Z7722 Contact with and (suspected) exposure to environmental tobacco smoke (acute) (chronic): Secondary | ICD-10-CM | POA: Diagnosis not present

## 2021-03-20 DIAGNOSIS — Z833 Family history of diabetes mellitus: Secondary | ICD-10-CM | POA: Diagnosis not present

## 2021-03-20 DIAGNOSIS — Z87891 Personal history of nicotine dependence: Secondary | ICD-10-CM | POA: Diagnosis not present

## 2021-03-20 DIAGNOSIS — Z6830 Body mass index (BMI) 30.0-30.9, adult: Secondary | ICD-10-CM | POA: Diagnosis not present

## 2021-03-20 DIAGNOSIS — Z7984 Long term (current) use of oral hypoglycemic drugs: Secondary | ICD-10-CM | POA: Diagnosis not present

## 2021-03-20 DIAGNOSIS — J309 Allergic rhinitis, unspecified: Secondary | ICD-10-CM | POA: Diagnosis not present

## 2021-03-20 DIAGNOSIS — E669 Obesity, unspecified: Secondary | ICD-10-CM | POA: Diagnosis not present

## 2021-03-20 DIAGNOSIS — Z809 Family history of malignant neoplasm, unspecified: Secondary | ICD-10-CM | POA: Diagnosis not present

## 2021-03-20 DIAGNOSIS — E119 Type 2 diabetes mellitus without complications: Secondary | ICD-10-CM | POA: Diagnosis not present

## 2021-03-20 DIAGNOSIS — I10 Essential (primary) hypertension: Secondary | ICD-10-CM | POA: Diagnosis not present

## 2021-03-20 DIAGNOSIS — K219 Gastro-esophageal reflux disease without esophagitis: Secondary | ICD-10-CM | POA: Diagnosis not present

## 2021-05-25 ENCOUNTER — Other Ambulatory Visit: Payer: Self-pay | Admitting: Family Medicine

## 2021-06-10 ENCOUNTER — Other Ambulatory Visit: Payer: Self-pay | Admitting: Family Medicine

## 2021-06-27 ENCOUNTER — Telehealth: Payer: Self-pay | Admitting: Family Medicine

## 2021-06-27 NOTE — Telephone Encounter (Signed)
Left message for patient to call back and schedule Medicare Annual Wellness Visit (AWV) in office.  ? ?If not able to come in office, please offer to do virtually or by telephone.  Left office number and my jabber [phone]. ? ?AWVI eligible as of 06/20/2021 ? ?Please schedule at anytime with Nurse Health Advisor. ?  ?

## 2021-07-08 ENCOUNTER — Other Ambulatory Visit: Payer: Self-pay | Admitting: Family Medicine

## 2021-08-28 LAB — HM DIABETES EYE EXAM

## 2021-09-03 ENCOUNTER — Ambulatory Visit (INDEPENDENT_AMBULATORY_CARE_PROVIDER_SITE_OTHER): Payer: Medicare HMO | Admitting: Family Medicine

## 2021-09-03 VITALS — BP 152/82 | HR 98 | Temp 98.0°F | Ht 73.0 in | Wt 240.2 lb

## 2021-09-03 DIAGNOSIS — R42 Dizziness and giddiness: Secondary | ICD-10-CM | POA: Diagnosis not present

## 2021-09-03 DIAGNOSIS — M7552 Bursitis of left shoulder: Secondary | ICD-10-CM

## 2021-09-03 NOTE — Progress Notes (Signed)
? ?Subjective:  ? ? Patient ID: Gerald [name], male    DOB: 09-Jan-1956, 66 y.o.   MRN: [id number] ? ?HPI  ?Patient is here today for 2 concerns.  First he has been having intermittent vertigo for the last 3 to 4 weeks.  He states it is gradually getting better in the last 3 to 4 days has been feeling good.  He states that he will roll over in bed and the rheumatologist for him.  He was set up in the room for.  This will occur randomly and with position changes.  Never occurs when sitting completely still.  He denies any hearing loss and reports.  He denies any headache, blurry vision or head trauma. ? ?Second he reports pain in his left shoulder with abduction greater than 90 degrees.  However he has a negative empty can sign and negative Hawkins sign.  Pain is sore when he tries to sleep on his shoulder at night.  He also has some pain with internal rotation.  He has good strength in the shoulder however no weakness with resisted abduction ?Past Medical History:  ?Diagnosis Date  ? Abnormal LFTs (liver function tests) 12/2012  ? Allergy   ? Anal condyloma   ? Anemia 07/2010  ? Diabetes mellitus   ? Gastric ulcer 07/2010  ? bleeding GU, clipped and injected with epi. non-bleeding ulcer at [place]. biopsy negative for H pylori.  on follow up EGD 08/2010, ulcers totally heled  ? GERD with stricture   ? Hyperlipemia   ? Unspecified essential hypertension   ? Upper GI bleed   ? ?Past Surgical History:  ?Procedure Laterality Date  ? COLONOSCOPY  10/2010, 04/09/2017  ? sessile polyp (path: tubular adenoma) in tranverse colon. Dr Deatra Ina  ? ESOPHAGOGASTRODUODENOSCOPY  07/24/2010  ? for hematemesis.  bleeding GU with VV, injected with epi and endoclipped.  second, non-bleeding ulcer at [facility]. biopsies negative for h pylori or dysplasia  ? ESOPHAGOGASTRODUODENOSCOPY  08/2010  ? to assess the gastric ulcer.  study was normal.  no residual ulcers.  Dr Deatra Ina  ? ESOPHAGOGASTRODUODENOSCOPY N/A 07/10/2017  ? Procedure:  ESOPHAGOGASTRODUODENOSCOPY (EGD);  Surgeon: Jerene Bears, MD;  Location: [facility];  Service: Gastroenterology;  Laterality: N/A;  ? ESOPHAGOGASTRODUODENOSCOPY (EGD) WITH PROPOFOL N/A 03/08/2016  ? Procedure: ESOPHAGOGASTRODUODENOSCOPY (EGD) WITH PROPOFOL;  Surgeon: Jerene Bears, MD;  Location: [facility];  Service: Gastroenterology;  Laterality: N/A;  ? GANGLION CYST EXCISION    ? POLYPECTOMY    ? UPPER GASTROINTESTINAL ENDOSCOPY    ? ?Current Outpatient Medications on File Prior to Visit  ?Medication Sig Dispense Refill  ? aspirin EC 81 MG tablet Take 81 mg by mouth at bedtime.    ? BD PEN NEEDLE NANO 2ND GEN 32G X 4 MM MISC USE AS DIRECTED TO INJECT INSULIN UNDER THE SKIN ONCE DAILY 100 each 1  ? dapagliflozin propanediol (FARXIGA) 10 MG TABS tablet Take 1 tablet (10 mg total) by mouth daily. 90 tablet 3  ? glipiZIDE (GLUCOTROL XL) 10 MG 24 hr tablet Take 1 tablet (10 mg total) by mouth daily. 90 tablet 3  ? hydrochlorothiazide (HYDRODIURIL) 25 MG tablet Take 1 tablet (25 mg total) by mouth daily. 90 tablet 3  ? Insulin Glargine (BASAGLAR KWIKPEN) 100 UNIT/ML INJECT 10 TO 20 UNITS INTO THE SKIN DAILY 15 mL 5  ? Lancets (ONETOUCH ULTRASOFT) lancets USE TO CHECK BLOOD SUGAR 3 TIMES DAILY.ICD10:R73.01 100 each 12  ? losartan (COZAAR) 100 MG tablet  Take 1 tablet (100 mg total) by mouth daily. 90 tablet 3  ? metFORMIN (GLUCOPHAGE) 1000 MG tablet TAKE 1 TABLET TWICE A DAY 180 tablet 3  ? Misc Natural Products (TRIPLE FLEX) CAPS Take 1 capsule by mouth.    ? montelukast (SINGULAIR) 10 MG tablet Take 1 tablet (10 mg total) by mouth at bedtime. 90 tablet 3  ? Multiple Vitamin (MULTIVITAMIN PO) Take by mouth.    ? naproxen sodium (ANAPROX) 220 MG tablet Take 440 mg by mouth 2 (two) times daily with a meal.    ? omeprazole (PRILOSEC) 40 MG capsule Take 1 capsule (40 mg total) by mouth daily. 90 capsule 3  ? ONETOUCH VERIO test strip USE TO CHECK BLOOD SUGAR 3 TIMES DAILY.ICD10:R73.01 100 strip 3  ? pravastatin  (PRAVACHOL) 20 MG tablet Take 1 tablet (20 mg total) by mouth daily. 90 tablet 3  ? sildenafil (VIAGRA) 100 MG tablet TAKE ONE-HALF (1/2) TO ONE TABLET DAILY AS NEEDED FOR ERECTILE DYSFUNCTION 30 tablet 3  ? ?No current facility-administered medications on file prior to visit.  ? ?No Known Allergies ?Social History  ? ?Socioeconomic History  ? Marital status: Married  ?  Spouse name: Not on file  ? Number of children: Not on file  ? Years of education: Not on file  ? Highest education level: Not on file  ?Occupational History  ? Occupation: Building control surveyor   ?  Employer: [employer]  ?Tobacco Use  ? Smoking status: Former  ? Smokeless tobacco: Former  ?  Types: Chew  ?Vaping Use  ? Vaping Use: Never used  ?Substance and Sexual Activity  ? Alcohol use: Yes  ?  Alcohol/week: 2.0 standard drinks  ?  Types: 2 Standard drinks or equivalent per week  ? Drug use: No  ? Sexual activity: Not on file  ?Other Topics Concern  ? Not on file  ?Social History Narrative  ? Not on file  ? ?Social Determinants of Health  ? ?Financial Resource Strain: Not on file  ?Food Insecurity: Not on file  ?Transportation Needs: Not on file  ?Physical Activity: Not on file  ?Stress: Not on file  ?Social Connections: Not on file  ?Intimate Partner Violence: Not on file  ? ?Family History  ?Problem Relation Age of Onset  ? Diabetes Brother   ? Stomach cancer Mother   ? Colon cancer Neg Hx   ? Colon polyps Neg Hx   ? Esophageal cancer Neg Hx   ? Rectal cancer Neg Hx   ? ? ? ? ?Review of Systems  ?All other systems reviewed and are negative. ? ?   ?Objective:  ? Physical Exam ?Vitals reviewed.  ?Constitutional:   ?   General: He is not in acute distress. ?   Appearance: He is well-developed. He is not diaphoretic.  ?HENT:  ?   Head: Normocephalic and atraumatic.  ?   Right Ear: Tympanic membrane and external ear normal.  ?   Left Ear: Tympanic membrane and external ear normal.  ?   Nose: Nose normal.  ?   Mouth/Throat:  ?   Pharynx: No oropharyngeal exudate.   ?Eyes:  ?   General: No scleral icterus.    ?   Right eye: No discharge.     ?   Left eye: No discharge.  ?   Conjunctiva/sclera: Conjunctivae normal.  ?   Pupils: Pupils are equal, round, and reactive to light.  ?Neck:  ?   Thyroid: No thyromegaly.  ?  Vascular: No JVD.  ?   Trachea: No tracheal deviation.  ?Cardiovascular:  ?   Rate and Rhythm: Normal rate and regular rhythm.  ?   Heart sounds: Normal heart sounds. No murmur heard. ?  No friction rub. No gallop.  ?Pulmonary:  ?   Effort: Pulmonary effort is normal. No respiratory distress.  ?   Breath sounds: Normal breath sounds. No stridor. No wheezing or rales.  ?Chest:  ?   Chest wall: No tenderness.  ?Musculoskeletal:  ?   Left shoulder: No swelling, deformity or bony tenderness. Decreased range of motion. Normal strength.  ?   Cervical back: Normal range of motion and neck supple.  ?Lymphadenopathy:  ?   Cervical: No cervical adenopathy.  ?Skin: ?   General: Skin is warm.  ?Neurological:  ?   General: No focal deficit present.  ?   Mental Status: He is alert and oriented to person, place, and time. Mental status is at baseline.  ?   Cranial Nerves: No cranial nerve deficit.  ?   Motor: No weakness or abnormal muscle tone.  ?   Coordination: Coordination normal.  ?   Gait: Gait normal.  ?   Deep Tendon Reflexes: Reflexes are normal and symmetric.  ? ? ?   ?Assessment & Plan:  ?Vertigo ? ?Subacromial bursitis of left shoulder joint ?I believe the patient has benign paroxysmal positional.  He is continuing to improve so I recommended tincture of time as I anticipate this ?Neck.  We did discuss Epley maneuvers with particle repositioning maneuvers at the present time patient is feeling better.  Declines pursuing that route.  He can use meclizine as needed for severe cases.  I believe he has bursitis in his left shoulder.  Offered the patient a cortisone injection but he elects.  Pain is not improving he will return for cortisone shot. ?

## 2021-09-07 ENCOUNTER — Other Ambulatory Visit: Payer: Self-pay | Admitting: Family Medicine

## 2021-09-07 NOTE — Telephone Encounter (Signed)
Requested Prescriptions  Pending Prescriptions Disp Refills  . glipiZIDE (GLUCOTROL XL) 10 MG 24 hr tablet [Pharmacy Med Name: GLIPIZIDE ER 10 MG TABLET] 90 tablet 1    Sig: TAKE 1 TABLET BY MOUTH EVERY DAY     Endocrinology:  Diabetes - Sulfonylureas Failed - 09/07/2021  2:18 AM      Failed - HBA1C is between 0 and 7.9 and within 180 days    Hgb A1c MFr Bld  Date Value Ref Range Status  12/19/2020 8.6 (H) <5.7 % of total Hgb Final    Comment:    For someone without known diabetes, a hemoglobin A1c value of 6.5% or greater indicates that they may have  diabetes and this should be confirmed with a follow-up  test. . For someone with known diabetes, a value <7% indicates  that their diabetes is well controlled and a value  greater than or equal to 7% indicates suboptimal  control. A1c targets should be individualized based on  duration of diabetes, age, comorbid conditions, and  other considerations. . Currently, no consensus exists regarding use of hemoglobin A1c for diagnosis of diabetes for children. .          Passed - Cr in normal range and within 360 days    Creat  Date Value Ref Range Status  12/19/2020 0.87 0.70 - 1.35 mg/dL Final   Creatinine, Urine  Date Value Ref Range Status  03/09/2019 59 20 - 320 mg/dL Final         Passed - Valid encounter within last 6 months    Recent Outpatient Visits          4 days ago [place] Susy Frizzle, MD   5 months ago Acute cough   [place] Noemi Chapel A, NP   8 months ago Controlled type 2 diabetes mellitus without complication, without long-term current use of insulin ([place])   [place] Susy Frizzle, MD   1 year ago Controlled type 2 diabetes mellitus without complication, without long-term current use of insulin ([place])   [place] Pickard, Cammie Mcgee, MD   1 year ago Controlled type 2 diabetes mellitus without complication,  without long-term current use of insulin ([place])   [facility] Pickard, Cammie Mcgee, MD

## 2021-09-11 ENCOUNTER — Ambulatory Visit (INDEPENDENT_AMBULATORY_CARE_PROVIDER_SITE_OTHER): Payer: Medicare HMO | Admitting: Family Medicine

## 2021-09-11 VITALS — BP 128/80 | HR 91 | Temp 98.2°F | Ht 73.0 in | Wt 240.6 lb

## 2021-09-11 DIAGNOSIS — M7552 Bursitis of left shoulder: Secondary | ICD-10-CM | POA: Diagnosis not present

## 2021-09-11 DIAGNOSIS — M25512 Pain in left shoulder: Secondary | ICD-10-CM

## 2021-09-11 NOTE — Progress Notes (Signed)
Subjective:    Patient ID: Gerald [name], male    DOB: 1955-08-18, 66 y.o.   MRN: [id number]  HPI  Please see the last visit.  Patient states the pain in his left shoulder is getting worse.  He states that is keeping him awake at night.  Starting to ache and throb constantly.  He is having a difficult time lifting his left arm above 90 degrees due to the pain in his shoulder.  He is requesting a cortisone injection. Past Medical History:  Diagnosis Date  . Abnormal LFTs (liver function tests) 12/2012  . Allergy   . Anal condyloma   . Anemia 07/2010  . Diabetes mellitus   . Gastric ulcer 07/2010   bleeding GU, clipped and injected with epi. non-bleeding ulcer at [place]. biopsy negative for H pylori.  on follow up EGD 08/2010, ulcers totally heled  . GERD with stricture   . Hyperlipemia   . Unspecified essential hypertension   . Upper GI bleed    Past Surgical History:  Procedure Laterality Date  . COLONOSCOPY  10/2010, 04/09/2017   sessile polyp (path: tubular adenoma) in tranverse colon. Dr Deatra Ina  . ESOPHAGOGASTRODUODENOSCOPY  07/24/2010   for hematemesis.  bleeding GU with VV, injected with epi and endoclipped.  second, non-bleeding ulcer at [facility]. biopsies negative for h pylori or dysplasia  . ESOPHAGOGASTRODUODENOSCOPY  08/2010   to assess the gastric ulcer.  study was normal.  no residual ulcers.  Dr Deatra Ina  . ESOPHAGOGASTRODUODENOSCOPY N/A 07/10/2017   Procedure: ESOPHAGOGASTRODUODENOSCOPY (EGD);  Surgeon: Jerene Bears, MD;  Location: [facility];  Service: Gastroenterology;  Laterality: N/A;  . ESOPHAGOGASTRODUODENOSCOPY (EGD) WITH PROPOFOL N/A 03/08/2016   Procedure: ESOPHAGOGASTRODUODENOSCOPY (EGD) WITH PROPOFOL;  Surgeon: Jerene Bears, MD;  Location: [facility];  Service: Gastroenterology;  Laterality: N/A;  . GANGLION CYST EXCISION    . POLYPECTOMY    . UPPER GASTROINTESTINAL ENDOSCOPY     Current Outpatient Medications on File Prior to Visit  Medication Sig  Dispense Refill  . aspirin EC 81 MG tablet Take 81 mg by mouth at bedtime.    . BD PEN NEEDLE NANO 2ND GEN 32G X 4 MM MISC USE AS DIRECTED TO INJECT INSULIN UNDER THE SKIN ONCE DAILY 100 each 1  . dapagliflozin propanediol (FARXIGA) 10 MG TABS tablet Take 1 tablet (10 mg total) by mouth daily. 90 tablet 3  . glipiZIDE (GLUCOTROL XL) 10 MG 24 hr tablet TAKE 1 TABLET BY MOUTH EVERY DAY 90 tablet 1  . hydrochlorothiazide (HYDRODIURIL) 25 MG tablet Take 1 tablet (25 mg total) by mouth daily. 90 tablet 3  . Insulin Glargine (BASAGLAR KWIKPEN) 100 UNIT/ML INJECT 10 TO 20 UNITS INTO THE SKIN DAILY 15 mL 5  . Lancets (ONETOUCH ULTRASOFT) lancets USE TO CHECK BLOOD SUGAR 3 TIMES DAILY.ICD10:R73.01 100 each 12  . losartan (COZAAR) 100 MG tablet Take 1 tablet (100 mg total) by mouth daily. 90 tablet 3  . metFORMIN (GLUCOPHAGE) 1000 MG tablet TAKE 1 TABLET TWICE A DAY 180 tablet 3  . Misc Natural Products (TRIPLE FLEX) CAPS Take 1 capsule by mouth.    . montelukast (SINGULAIR) 10 MG tablet Take 1 tablet (10 mg total) by mouth at bedtime. 90 tablet 3  . Multiple Vitamin (MULTIVITAMIN PO) Take by mouth.    . naproxen sodium (ANAPROX) 220 MG tablet Take 440 mg by mouth 2 (two) times daily with a meal.    . omeprazole (PRILOSEC) 40 MG capsule Take  1 capsule (40 mg total) by mouth daily. 90 capsule 3  . ONETOUCH VERIO test strip USE TO CHECK BLOOD SUGAR 3 TIMES DAILY.ICD10:R73.01 100 strip 3  . pravastatin (PRAVACHOL) 20 MG tablet Take 1 tablet (20 mg total) by mouth daily. 90 tablet 3  . sildenafil (VIAGRA) 100 MG tablet TAKE ONE-HALF (1/2) TO ONE TABLET DAILY AS NEEDED FOR ERECTILE DYSFUNCTION 30 tablet 3   No current facility-administered medications on file prior to visit.   No Known Allergies Social History   Socioeconomic History  . Marital status: Married    Spouse name: Not on file  . Number of children: Not on file  . Years of education: Not on file  . Highest education level: Not on file   Occupational History  . Occupation: Lexicographer: [employer]  Tobacco Use  . Smoking status: Former  . Smokeless tobacco: Former    Types: Secondary school teacher  . Vaping Use: Never used  Substance and Sexual Activity  . Alcohol use: Yes    Alcohol/week: 2.0 standard drinks    Types: 2 Standard drinks or equivalent per week  . Drug use: No  . Sexual activity: Not on file  Other Topics Concern  . Not on file  Social History Narrative  . Not on file   Social Determinants of Health   Financial Resource Strain: Not on file  Food Insecurity: Not on file  Transportation Needs: Not on file  Physical Activity: Not on file  Stress: Not on file  Social Connections: Not on file  Intimate Partner Violence: Not on file   Family History  Problem Relation Age of Onset  . Diabetes Brother   . Stomach cancer Mother   . Colon cancer Neg Hx   . Colon polyps Neg Hx   . Esophageal cancer Neg Hx   . Rectal cancer Neg Hx       Review of Systems  All other systems reviewed and are negative.     Objective:   Physical Exam Vitals reviewed.  Constitutional:      General: He is not in acute distress.    Appearance: He is well-developed. He is not diaphoretic.  HENT:     Head: Normocephalic and atraumatic.  Eyes:     General: No scleral icterus. Neck:     Thyroid: No thyromegaly.     Vascular: No JVD.     Trachea: No tracheal deviation.  Cardiovascular:     Rate and Rhythm: Normal rate and regular rhythm.     Heart sounds: Normal heart sounds. No murmur heard.   No friction rub. No gallop.  Pulmonary:     Effort: Pulmonary effort is normal. No respiratory distress.     Breath sounds: Normal breath sounds. No stridor. No wheezing or rales.  Chest:     Chest wall: No tenderness.  Musculoskeletal:     Left shoulder: No swelling, deformity or bony tenderness. Decreased range of motion. Normal strength.  Skin:    General: Skin is warm.  Neurological:     Mental Status: He  is alert.     Motor: No abnormal muscle tone.     Deep Tendon Reflexes: Reflexes are normal and symmetric.       Assessment & Plan:  Subacromial bursitis of left shoulder joint Using sterile technique, I injected the left subacromial space with 2 cc lidocaine, 2 cc of Marcaine, and 2 cc of 40 mg/mL Kenalog.  The patient tolerated the  procedure well without complication.

## 2021-10-02 ENCOUNTER — Telehealth: Payer: Self-pay

## 2021-10-02 ENCOUNTER — Other Ambulatory Visit: Payer: Self-pay | Admitting: Family Medicine

## 2021-10-02 DIAGNOSIS — M25519 Pain in unspecified shoulder: Secondary | ICD-10-CM

## 2021-10-02 NOTE — Telephone Encounter (Signed)
Is okay to send a referral for this patient

## 2021-10-02 NOTE — Telephone Encounter (Signed)
Pt's spouse called in stating that pt was recently seen in office for some shoulder pain. Pt did get a cortisone shot at last ov, but is still in pain. Pt would like to know if he could get a referral to a specialist please. Please advise.  Cb#: [phone]

## 2021-10-02 NOTE — Telephone Encounter (Signed)
Called and spoke w/ pt's wife inform that the referral has been placed some will can her regarding an appt.

## 2021-10-05 ENCOUNTER — Ambulatory Visit (INDEPENDENT_AMBULATORY_CARE_PROVIDER_SITE_OTHER): Payer: Medicare HMO

## 2021-10-05 ENCOUNTER — Encounter: Payer: Self-pay | Admitting: Surgical

## 2021-10-05 ENCOUNTER — Ambulatory Visit: Payer: Medicare HMO | Admitting: Surgical

## 2021-10-05 DIAGNOSIS — M25512 Pain in left shoulder: Secondary | ICD-10-CM | POA: Diagnosis not present

## 2021-10-05 NOTE — Progress Notes (Signed)
Office Visit Note   Patient: Gerald [name]           Date of Birth: 02/03/1956           MRN: [id number] Visit Date: 10/05/2021 Requested by: Susy Frizzle, MD [address] [place],  [place] [address] PCP: Susy Frizzle, MD  Subjective: Chief Complaint  Patient presents with   Left Shoulder - Pain    HPI: Gerald [name] is a 66 y.o. male who presents to the office complaining of left shoulder pain.  Patient states that he went to sleep and woke up with his shoulder hurting.  Does not recall any injury.  He has had pain for about 1 to 2 months now.  States the pain is not constant but is worse with trying to raise his arm above his head.  Takes Tylenol and ibuprofen with mild relief.  No history of prior issue with his shoulder.  No history of surgery to his neck or shoulder.  Localizes pain to the anterior and superior aspects of the shoulder.  Denies any neck pain, scapular pain, numbness/tingling, radicular pain.  He had recent subacromial injection by his PCP that gave him 100% relief of his symptoms for about 3 days before symptoms returned in full force.  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                ROS: All systems reviewed are negative as they relate to the chief complaint within the history of present illness.  Patient denies fevers or chills.  Assessment & Plan: Visit Diagnoses:  1. Acute pain of left shoulder     Plan: Patient is a 66 year old male who presents for evaluation of left shoulder pain.  He has atraumatic onset of left shoulder pain that he first noticed when he was sleeping.  No neck or radicular symptoms by his history today.  He did have 100% relief from subacromial injection for about 3 days before symptoms returned.  Increased pain with stressing his rotator cuff on exam today but he does have good strength of the cuff.  After discussion of options, plan to order MRI arthrogram of the left shoulder  for further evaluation of rotator cuff pathology and bicep tendon pathology.  Follow-up after MRI to review results.  Follow-Up Instructions: No follow-ups on file.   Orders:  Orders Placed This Encounter  Procedures   XR Shoulder Left   MR Shoulder Left w/ contrast   Arthrogram   No orders of the defined types were placed in this encounter.     Procedures: No procedures performed   Clinical Data: No additional findings.  Objective: Vital Signs: There were no vitals taken for this visit.  Physical Exam:  Constitutional: Patient appears well-developed HEENT:  Head: Normocephalic Eyes:EOM are normal Neck: Normal range of motion Cardiovascular: Normal rate Pulmonary/chest: Effort normal Neurologic: Patient is alert Skin: Skin is warm Psychiatric: Patient has normal mood and affect  Ortho Exam: Ortho exam demonstrates left shoulder with 40 degrees external rotation, 90 degrees abduction, 160 degrees forward flexion.  Increased pain with passive external rotation.  Increased pain with supraspinatus, infraspinatus, subscapularis strength testing.  Positive Hawkins and Neer's impingement signs.  5/5 motor strength of bilateral supra, infra, subscap.  5/5 motor strength of bilateral grip strength, finger abduction, pronation/supination, bicep, tricep, deltoid.  Moderate to severe tenderness over the bicipital groove.  No tenderness over the  AC joint.  Negative Spurling sign.  Negative Lhermitte sign.  No pain with cervical spine range of motion.  Specialty Comments:  No specialty comments available.  Imaging: No results found.   PMFS History: Patient Active Problem List   Diagnosis Date Noted   Food impaction of esophagus    Esophageal obstruction due to food impaction    Gastric ulcer with hemorrhage 09/13/2010   Special screening for malignant neoplasms, colon 09/13/2010   Past Medical History:  Diagnosis Date   Abnormal LFTs (liver function tests) 12/2012   Allergy     Anal condyloma    Anemia 07/2010   Diabetes mellitus    Gastric ulcer 07/2010   bleeding GU, clipped and injected with epi. non-bleeding ulcer at [facility]. biopsy negative for H pylori.  on follow up EGD 08/2010, ulcers totally heled   GERD with stricture    Hyperlipemia    Unspecified essential hypertension    Upper GI bleed     Family History  Problem Relation Age of Onset   Diabetes Brother    Stomach cancer Mother    Colon cancer Neg Hx    Colon polyps Neg Hx    Esophageal cancer Neg Hx    Rectal cancer Neg Hx     Past Surgical History:  Procedure Laterality Date   COLONOSCOPY  10/2010, 04/09/2017   sessile polyp (path: tubular adenoma) in tranverse colon. Dr Deatra Ina   ESOPHAGOGASTRODUODENOSCOPY  07/24/2010   for hematemesis.  bleeding GU with VV, injected with epi and endoclipped.  second, non-bleeding ulcer at [facility]. biopsies negative for h pylori or dysplasia   ESOPHAGOGASTRODUODENOSCOPY  08/2010   to assess the gastric ulcer.  study was normal.  no residual ulcers.  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

## 2021-10-17 ENCOUNTER — Other Ambulatory Visit: Payer: Self-pay | Admitting: Family Medicine

## 2021-10-17 NOTE — Telephone Encounter (Signed)
Requested Prescriptions  Pending Prescriptions Disp Refills  . montelukast (SINGULAIR) 10 MG tablet [Pharmacy Med Name: MONTELUKAST SOD 10 MG TABLET] 90 tablet 0    Sig: TAKE 1 TABLET BY MOUTH EVERYDAY AT BEDTIME     Pulmonology:  Leukotriene Inhibitors Passed - 10/17/2021  1:57 AM      Passed - Valid encounter within last 12 months    Recent Outpatient Visits          1 month ago Subacromial bursitis of left shoulder joint   [place] Susy Frizzle, MD   1 month ago [place] Susy Frizzle, MD   7 months ago Acute cough   [place], NP   10 months ago Controlled type 2 diabetes mellitus without complication, without long-term current use of insulin ([place])   [place] Susy Frizzle, MD   1 year ago Controlled type 2 diabetes mellitus without complication, without long-term current use of insulin ([place])   [facility] Pickard, Cammie Mcgee, MD

## 2021-10-24 ENCOUNTER — Other Ambulatory Visit: Payer: Self-pay | Admitting: Family Medicine

## 2021-10-29 ENCOUNTER — Ambulatory Visit
Admission: RE | Admit: 2021-10-29 | Discharge: 2021-10-29 | Disposition: A | Discharge status: Home / Self Care | Payer: Medicare HMO | Source: Ambulatory Visit | Attending: Surgical | Admitting: Surgical

## 2021-10-29 DIAGNOSIS — M25512 Pain in left shoulder: Secondary | ICD-10-CM

## 2021-10-29 DIAGNOSIS — S46012A Strain of muscle(s) and tendon(s) of the rotator cuff of left shoulder, initial encounter: Secondary | ICD-10-CM | POA: Diagnosis not present

## 2021-10-29 MED ORDER — IOPAMIDOL (ISOVUE-M 200) INJECTION 41%
12.0000 mL | Freq: Once | INTRAMUSCULAR | Status: AC
  Administered 2021-10-29: 12 mL via INTRA_ARTICULAR

## 2021-11-05 ENCOUNTER — Ambulatory Visit: Payer: Medicare HMO | Admitting: Orthopedic Surgery

## 2021-11-05 DIAGNOSIS — M25512 Pain in left shoulder: Secondary | ICD-10-CM

## 2021-11-05 DIAGNOSIS — M7552 Bursitis of left shoulder: Secondary | ICD-10-CM

## 2021-11-07 ENCOUNTER — Encounter: Payer: Self-pay | Admitting: Orthopedic Surgery

## 2021-11-07 MED ORDER — BUPIVACAINE HCL 0.5 % IJ SOLN
9.0000 mL | INTRAMUSCULAR | Status: AC | PRN
  Administered 2021-11-05: 9 mL via INTRA_ARTICULAR

## 2021-11-07 MED ORDER — METHYLPREDNISOLONE ACETATE 40 MG/ML IJ SUSP
40.0000 mg | INTRAMUSCULAR | Status: AC | PRN
  Administered 2021-11-05: 40 mg via INTRA_ARTICULAR

## 2021-11-07 MED ORDER — LIDOCAINE HCL 1 % IJ SOLN
5.0000 mL | INTRAMUSCULAR | Status: AC | PRN
  Administered 2021-11-05: 5 mL

## 2021-11-07 NOTE — Progress Notes (Signed)
Office Visit Note   Patient: Gerald [name]           Date of Birth: 16-Oct-1955           MRN: [id number] Visit Date: 11/05/2021 Requested by: Gerald Frizzle, MD [address] [place],  [place] [address] PCP: Gerald Frizzle, MD  Subjective: Chief Complaint  Patient presents with   Other     Scan review    HPI: Gerald [name] is a 66 year old patient with left shoulder pain.  Here to review MRI scan.  Had a subacromial injection about 6 weeks ago.  MRI scan of the shoulder shows a moderate partial-thickness tear of the mid substance supraspinatus tendon footprint.  Biceps has some tendinosis.  No rotator cuff muscle atrophy.  There is a high-grade partial-thickness tear of the superior 15 mm of the subscap tendon.  Ibuprofen helps some along with Tylenol.  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  Denies any numbness and tingling but does report some occasional trapezial pain.  When he puts his arm overhead is a little bit better.  Overall he has level 6 out of 10 pain.  Equivocal radiation below the elbow.              ROS: All systems reviewed are negative as they relate to the chief complaint within the history of present illness.  Patient denies  fevers or chills.   Assessment & Plan: Visit Diagnoses:  1. Acute pain of left shoulder     Plan: Impression is left shoulder pain.  Did have a subacromial injection 6 weeks ago which gave him marginal relief.  That was done at another office.  Today I think would be good to do a divided glenohumeral joint and subacromial injection.  6-week return for clinical recheck.  Could consider surgical intervention but there is a small possibility this could have a component of cervical radiculopathy as well.  Would like to explore that more in his subsequent clinic visit before deciding definitively on surgical intervention for the shoulder which would look like rotator cuff repair and biceps tenodesis as well  as subacromial decompression.  No evidence of frozen shoulder today.  Follow-Up Instructions: Return in about 6 weeks (around 12/17/2021).   Orders:  No orders of the defined types were placed in this encounter.  No orders of the defined types were placed in this encounter.     Procedures: Large Joint Inj: L glenohumeral on 11/05/2021 6:02 AM Indications: diagnostic evaluation and pain Details: 18 G 1.5 in needle, posterior approach  Arthrogram: No  Medications: 9 mL bupivacaine 0.5 %; 40 mg methylPREDNISolone acetate 40 MG/ML; 5 mL lidocaine 1 % Outcome: tolerated well, no immediate complications Procedure, treatment alternatives, risks and benefits explained, specific risks discussed. Consent was given by the patient. Immediately prior to procedure a time out was called to verify the correct patient, procedure, equipment, support staff and site/side marked as required. Patient was prepped and draped in the usual sterile fashion.       Clinical Data: No additional findings.  Objective: Vital Signs: There were no vitals taken for this visit.  Physical Exam:   Constitutional: Patient appears well-developed HEENT:  Head: Normocephalic Eyes:EOM are normal Neck: Normal range of motion Cardiovascular: Normal rate Pulmonary/chest: Effort normal Neurologic: Patient is alert Skin: Skin is warm Psychiatric: Patient has normal mood and affect   Ortho Exam: Ortho exam demonstrates full active and passive  range of motion of the cervical spine.  5 out of 5 grip EPL FPL interosseous wrist flexion extension biceps triceps and deltoid strength.  He has symmetric external rotation strength although is slightly painful in the left compared to the right.  Subscap strength also intact.  O'Brien's testing equivocal on the left negative on the right.  Speeds testing equivocal on the left negative on the right.  No discrete AC joint tenderness with direct palpation or crossarm adduction on the  left-hand side.  Passive range of motion of that left shoulder is 60/95/165.  Specialty Comments:  No specialty comments available.  Imaging: No results found.   PMFS History: Patient Active Problem List   Diagnosis Date Noted   Food impaction of esophagus    Esophageal obstruction due to food impaction    Gastric ulcer with hemorrhage 09/13/2010   Special screening for malignant neoplasms, colon 09/13/2010   Past Medical History:  Diagnosis Date   Abnormal LFTs (liver function tests) 12/2012   Allergy    Anal condyloma    Anemia 07/2010   Diabetes mellitus    Gastric ulcer 07/2010   bleeding GU, clipped and injected with epi. non-bleeding ulcer at [facility]. biopsy negative for H pylori.  on follow up EGD 08/2010, ulcers totally heled   GERD with stricture    Hyperlipemia    Unspecified essential hypertension    Upper GI bleed     Family History  Problem Relation Age of Onset   Diabetes Brother    Stomach cancer Mother    Colon cancer Neg Hx    Colon polyps Neg Hx    Esophageal cancer Neg Hx    Rectal cancer Neg Hx     Past Surgical History:  Procedure Laterality Date   COLONOSCOPY  10/2010, 04/09/2017   sessile polyp (path: tubular adenoma) in tranverse colon. Dr Deatra Ina   ESOPHAGOGASTRODUODENOSCOPY  07/24/2010   for hematemesis.  bleeding GU with VV, injected with epi and endoclipped.  second, non-bleeding ulcer at [facility]. biopsies negative for h pylori or dysplasia   ESOPHAGOGASTRODUODENOSCOPY  08/2010   to assess the gastric ulcer.  study was normal.  no residual ulcers.  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

## 2021-11-29 ENCOUNTER — Other Ambulatory Visit: Payer: Self-pay | Admitting: Family Medicine

## 2021-11-29 NOTE — Telephone Encounter (Signed)
Requested Prescriptions  Pending Prescriptions Disp Refills  . metFORMIN (GLUCOPHAGE) 1000 MG tablet [Pharmacy Med Name: METFORMIN HCL 1,000 MG TABLET] 180 tablet 0    Sig: TAKE 1 TABLET BY MOUTH TWICE A DAY     Endocrinology:  Diabetes - Biguanides Failed - 11/29/2021  2:22 AM      Failed - HBA1C is between 0 and 7.9 and within 180 days    Hgb A1c MFr Bld  Date Value Ref Range Status  12/19/2020 8.6 (H) <5.7 % of total Hgb Final    Comment:    For someone without known diabetes, a hemoglobin A1c value of 6.5% or greater indicates that they may have  diabetes and this should be confirmed with a follow-up  test. . For someone with known diabetes, a value <7% indicates  that their diabetes is well controlled and a value  greater than or equal to 7% indicates suboptimal  control. A1c targets should be individualized based on  duration of diabetes, age, comorbid conditions, and  other considerations. . Currently, no consensus exists regarding use of hemoglobin A1c for diagnosis of diabetes for children. .          Failed - B12 Level in normal range and within 720 days    No results found for: "VITAMINB12"       Passed - Cr in normal range and within 360 days    Creat  Date Value Ref Range Status  12/19/2020 0.87 0.70 - 1.35 mg/dL Final   Creatinine, Urine  Date Value Ref Range Status  03/09/2019 59 20 - 320 mg/dL Final         Passed - eGFR in normal range and within 360 days    GFR, Est African American  Date Value Ref Range Status  03/09/2020 107 > OR = 60 mL/min/1.70m Final   GFR, Est Non African American  Date Value Ref Range Status  03/09/2020 93 > OR = 60 mL/min/1.719mFinal   eGFR  Date Value Ref Range Status  12/19/2020 96 > OR = 60 mL/min/1.7378minal    Comment:    The eGFR is based on the CKD-EPI 2021 equation. To calculate  the new eGFR from a previous Creatinine or Cystatin C result, go to  https://www.kidney.org/professionals/ kdoqi/gfr%5Fcalculator          Passed - Valid encounter within last 6 months    Recent Outpatient Visits          2 months ago Subacromial bursitis of left shoulder joint   [place], [place]ammie McgeeD   2 months ago [place] Scha[place]ie McgeeD   8 months ago Acute cough   [place] NP   11 months ago Controlled type 2 diabetes mellitus without complication, without long-term current use of insulin ([place] BroGilbertscSusy FrizzleD   1 year ago Controlled type 2 diabetes mellitus without complication, without long-term current use of insulin (HCCArrey BroRudolphckard, WarCammie McgeeD      Future Appointments            In 2 weeks DeaMarlou SareTonna CornerD CHM[place]thin normal limits and completed in the last 12 months    WBC  Date Value Ref Range Status  12/19/2020 8.2 3.8 - 10.8  Thousand/uL Final   RBC  Date Value Ref Range Status  12/19/2020 5.10 4.20 - 5.80 Million/uL Final   Hemoglobin  Date Value Ref Range Status  12/19/2020 15.2 13.2 - 17.1 g/dL Final   HCT  Date Value Ref Range Status  12/19/2020 44.7 38.5 - 50.0 % Final   MCHC  Date Value Ref Range Status  12/19/2020 34.0 32.0 - 36.0 g/dL Final   [facility]  Date Value Ref Range Status  12/19/2020 29.8 27.0 - 33.0 pg Final   MCV  Date Value Ref Range Status  12/19/2020 87.6 80.0 - 100.0 fL Final   No results found for: "PLTCOUNTKUC", "LABPLAT", "POCPLA" RDW  Date Value Ref Range Status  12/19/2020 13.6 11.0 - 15.0 % Final         . hydrochlorothiazide (HYDRODIURIL) 25 MG tablet [Pharmacy Med Name: HYDROCHLOROTHIAZIDE 25 MG TAB] 90 tablet 0    Sig: TAKE 1 TABLET (25 MG TOTAL) BY MOUTH DAILY.     Cardiovascular: Diuretics - Thiazide Failed - 11/29/2021  2:22 AM      Failed - Cr in normal  range and within 180 days    Creat  Date Value Ref Range Status  12/19/2020 0.87 0.70 - 1.35 mg/dL Final   Creatinine, Urine  Date Value Ref Range Status  03/09/2019 59 20 - 320 mg/dL Final         Failed - K in normal range and within 180 days    Potassium  Date Value Ref Range Status  12/19/2020 4.4 3.5 - 5.3 mmol/L Final         Failed - Na in normal range and within 180 days    Sodium  Date Value Ref Range Status  12/19/2020 137 135 - 146 mmol/L Final         Passed - Last BP in normal range    BP Readings from Last 1 Encounters:  09/11/21 128/80         Passed - Valid encounter within last 6 months    Recent Outpatient Visits          2 months ago Subacromial bursitis of left shoulder joint   [place] Susy Frizzle, MD   2 months ago [place] Susy Frizzle, MD   8 months ago Acute cough   [place] Noemi Chapel A, NP   11 months ago Controlled type 2 diabetes mellitus without complication, without long-term current use of insulin ([place])   [place] Susy Frizzle, MD   1 year ago Controlled type 2 diabetes mellitus without complication, without long-term current use of insulin ([place])   [place] Pickard, Cammie Mcgee, MD      Future Appointments            In 2 weeks Marlou Sa, Tonna Corner, MD [facility]

## 2021-12-17 ENCOUNTER — Ambulatory Visit: Payer: Medicare HMO | Admitting: Orthopedic Surgery

## 2021-12-28 ENCOUNTER — Telehealth: Payer: Self-pay | Admitting: Family Medicine

## 2021-12-28 NOTE — Telephone Encounter (Signed)
Patient's spouse called to request a different prescription; stated phlegm and head congestion lingering; no fever.  Pharmacy confirmed as   [id number] [place] [place] [id number] Phone:  [phone] Fax:  [phone] DEA #:  [id number] Please advise at [phone].

## 2021-12-31 ENCOUNTER — Other Ambulatory Visit: Payer: Self-pay | Admitting: Family Medicine

## 2022-01-05 ENCOUNTER — Other Ambulatory Visit: Payer: Self-pay | Admitting: Family Medicine

## 2022-01-07 DIAGNOSIS — I10 Essential (primary) hypertension: Secondary | ICD-10-CM | POA: Diagnosis not present

## 2022-01-07 DIAGNOSIS — E119 Type 2 diabetes mellitus without complications: Secondary | ICD-10-CM | POA: Diagnosis not present

## 2022-01-07 DIAGNOSIS — Z7982 Long term (current) use of aspirin: Secondary | ICD-10-CM | POA: Diagnosis not present

## 2022-01-07 DIAGNOSIS — Z8249 Family history of ischemic heart disease and other diseases of the circulatory system: Secondary | ICD-10-CM | POA: Diagnosis not present

## 2022-01-07 DIAGNOSIS — Z809 Family history of malignant neoplasm, unspecified: Secondary | ICD-10-CM | POA: Diagnosis not present

## 2022-01-07 DIAGNOSIS — Z6831 Body mass index (BMI) 31.0-31.9, adult: Secondary | ICD-10-CM | POA: Diagnosis not present

## 2022-01-07 DIAGNOSIS — Z7984 Long term (current) use of oral hypoglycemic drugs: Secondary | ICD-10-CM | POA: Diagnosis not present

## 2022-01-07 DIAGNOSIS — K219 Gastro-esophageal reflux disease without esophagitis: Secondary | ICD-10-CM | POA: Diagnosis not present

## 2022-01-07 DIAGNOSIS — Z794 Long term (current) use of insulin: Secondary | ICD-10-CM | POA: Diagnosis not present

## 2022-01-07 DIAGNOSIS — E785 Hyperlipidemia, unspecified: Secondary | ICD-10-CM | POA: Diagnosis not present

## 2022-01-07 DIAGNOSIS — J302 Other seasonal allergic rhinitis: Secondary | ICD-10-CM | POA: Diagnosis not present

## 2022-01-07 DIAGNOSIS — E669 Obesity, unspecified: Secondary | ICD-10-CM | POA: Diagnosis not present

## 2022-01-07 NOTE — Telephone Encounter (Signed)
Requested Prescriptions  Pending Prescriptions Disp Refills  . BD PEN NEEDLE NANO 2ND GEN 32G X 4 MM MISC [Pharmacy Med Name: BD NANO 2 GEN PEN NDL 32G 4MM] 100 each 1    Sig: USE AS DIRECTED TO INJECT INSULIN UNDER THE SKIN ONCE DAILY     Endocrinology: Diabetes - Testing Supplies Passed - 01/05/2022 11:14 AM      Passed - Valid encounter within last 12 months    Recent Outpatient Visits          3 months ago Subacromial bursitis of left shoulder joint   [place] Pickard, Cammie Mcgee, MD   4 months ago [place] Susy Frizzle, MD   9 months ago Acute cough   [place] Eulogio Bear, NP   1 year ago Controlled type 2 diabetes mellitus without complication, without long-term current use of insulin ([place])   [place] Susy Frizzle, MD   1 year ago Controlled type 2 diabetes mellitus without complication, without long-term current use of insulin ([place])   [facility] Pickard, Cammie Mcgee, MD

## 2022-01-09 ENCOUNTER — Other Ambulatory Visit: Payer: Self-pay | Admitting: Family Medicine

## 2022-01-09 NOTE — Telephone Encounter (Signed)
Requested Prescriptions  Pending Prescriptions Disp Refills  . pravastatin (PRAVACHOL) 20 MG tablet [Pharmacy Med Name: Pravastatin Sodium 20 MG Oral Tablet] 90 tablet 0    Sig: Take 1 tablet by mouth once daily     Cardiovascular:  Antilipid - Statins Failed - 01/09/2022 11:08 AM      Failed - Lipid Panel in normal range within the last 12 months    Cholesterol  Date Value Ref Range Status  12/19/2020 148 <200 mg/dL Final   LDL Cholesterol (Calc)  Date Value Ref Range Status  12/19/2020 83 mg/dL (calc) Final    Comment:    Reference range: <100 . Desirable range <100 mg/dL for primary prevention;   <70 mg/dL for patients with CHD or diabetic patients  with > or = 2 CHD risk factors. Marland Kitchen LDL-C is now calculated using the Martin-Hopkins  calculation, which is a validated novel method providing  better accuracy than the Friedewald equation in the  estimation of LDL-C.  Cresenciano Genre et al. Annamaria Helling. 3546;568(12): 2061-2068  (http://education.QuestDiagnostics.com/faq/FAQ164)    HDL  Date Value Ref Range Status  12/19/2020 34 (L) > OR = 40 mg/dL Final   Triglycerides  Date Value Ref Range Status  12/19/2020 223 (H) <150 mg/dL Final    Comment:    . If a non-fasting specimen was collected, consider repeat triglyceride testing on a fasting specimen if clinically indicated.  Yates Decamp et al. J. of Clin. Lipidol. 7517;0:017-494. Marland Kitchen          Passed - Patient is not pregnant      Passed - Valid encounter within last 12 months    Recent Outpatient Visits          4 months ago Subacromial bursitis of left shoulder joint   [place] Pickard, Cammie Mcgee, MD   4 months ago [place] Dennard Schaumann, Cammie Mcgee, MD   10 months ago Acute cough   [place] Eulogio Bear, NP   1 year ago Controlled type 2 diabetes mellitus without complication, without long-term current use of insulin ([place])   [place]  Susy Frizzle, MD   1 year ago Controlled type 2 diabetes mellitus without complication, without long-term current use of insulin ([place])   [facility] Pickard, Cammie Mcgee, MD

## 2022-01-19 ENCOUNTER — Other Ambulatory Visit: Payer: Self-pay | Admitting: Family Medicine

## 2022-02-28 ENCOUNTER — Other Ambulatory Visit: Payer: Self-pay | Admitting: Family Medicine

## 2022-02-28 NOTE — Telephone Encounter (Signed)
Requested medication (s) are due for refill today -yes  Requested medication (s) are on the active medication list -yes  Future visit scheduled -no  Last refill: HCTZ 11/29/21 #90                  Metformin 11/29/21 #180  Notes to clinic: [place] lab protocol- over 1 year-12/19/20  Requested Prescriptions  Pending Prescriptions Disp Refills   hydrochlorothiazide (HYDRODIURIL) 25 MG tablet [Pharmacy Med Name: HYDROCHLOROTHIAZIDE 25 MG TAB] 90 tablet 0    Sig: TAKE 1 TABLET (25 MG TOTAL) BY MOUTH DAILY.     Cardiovascular: Diuretics - Thiazide Failed - 02/28/2022  2:44 AM      Failed - Cr in normal range and within 180 days    Creat  Date Value Ref Range Status  12/19/2020 0.87 0.70 - 1.35 mg/dL Final   Creatinine, Urine  Date Value Ref Range Status  03/09/2019 59 20 - 320 mg/dL Final         Failed - K in normal range and within 180 days    Potassium  Date Value Ref Range Status  12/19/2020 4.4 3.5 - 5.3 mmol/L Final         Failed - Na in normal range and within 180 days    Sodium  Date Value Ref Range Status  12/19/2020 137 135 - 146 mmol/L Final         Passed - Last BP in normal range    BP Readings from Last 1 Encounters:  09/11/21 128/80         Passed - Valid encounter within last 6 months    Recent Outpatient Visits           5 months ago Subacromial bursitis of left shoulder joint   [place] Susy Frizzle, MD   5 months ago [place] Susy Frizzle, MD   11 months ago Acute cough   [place] Eulogio Bear, NP   1 year ago Controlled type 2 diabetes mellitus without complication, without long-term current use of insulin ([place])   [place] Pickard, Cammie Mcgee, MD   1 year ago Controlled type 2 diabetes mellitus without complication, without long-term current use of insulin ([place])   [place] Pickard, Cammie Mcgee, MD               metFORMIN  (GLUCOPHAGE) 1000 MG tablet [Pharmacy Med Name: METFORMIN HCL 1,000 MG TABLET] 180 tablet 0    Sig: TAKE 1 TABLET BY MOUTH TWICE A DAY     Endocrinology:  Diabetes - Biguanides Failed - 02/28/2022  2:44 AM      Failed - Cr in normal range and within 360 days    Creat  Date Value Ref Range Status  12/19/2020 0.87 0.70 - 1.35 mg/dL Final   Creatinine, Urine  Date Value Ref Range Status  03/09/2019 59 20 - 320 mg/dL Final         Failed - HBA1C is between 0 and 7.9 and within 180 days    Hgb A1c MFr Bld  Date Value Ref Range Status  12/19/2020 8.6 (H) <5.7 % of total Hgb Final    Comment:    For someone without known diabetes, a hemoglobin A1c value of 6.5% or greater indicates that they may have  diabetes and this should be confirmed with a follow-up  test. . For someone with known diabetes,  a value <7% indicates  that their diabetes is well controlled and a value  greater than or equal to 7% indicates suboptimal  control. A1c targets should be individualized based on  duration of diabetes, age, comorbid conditions, and  other considerations. . Currently, no consensus exists regarding use of hemoglobin A1c for diagnosis of diabetes for children. .          Failed - eGFR in normal range and within 360 days    GFR, Est African American  Date Value Ref Range Status  03/09/2020 107 > OR = 60 mL/min/1.3m Final   GFR, Est Non African American  Date Value Ref Range Status  03/09/2020 93 > OR = 60 mL/min/1.747mFinal   eGFR  Date Value Ref Range Status  12/19/2020 96 > OR = 60 mL/min/1.738minal    Comment:    The eGFR is based on the CKD-EPI 2021 equation. To calculate  the new eGFR from a previous Creatinine or Cystatin C result, go to https://www.kidney.org/professionals/ kdoqi/gfr%5Fcalculator          Failed - B12 Level in normal range and within 720 days    No results found for: "VITAMINB12"       Failed - CBC within normal limits and completed in the last  12 months    WBC  Date Value Ref Range Status  12/19/2020 8.2 3.8 - 10.8 Thousand/uL Final   RBC  Date Value Ref Range Status  12/19/2020 5.10 4.20 - 5.80 Million/uL Final   Hemoglobin  Date Value Ref Range Status  12/19/2020 15.2 13.2 - 17.1 g/dL Final   HCT  Date Value Ref Range Status  12/19/2020 44.7 38.5 - 50.0 % Final   MCHC  Date Value Ref Range Status  12/19/2020 34.0 32.0 - 36.0 g/dL Final   MCH[facility] Suit[facility] LLCate Value Ref Range Status  12/19/2020 29.8 27.0 - 33.0 pg Final   MCV  Date Value Ref Range Status  12/19/2020 87.6 80.0 - 100.0 fL Final   No results found for: "PLTCOUNTKUC", "LABPLAT", "POCPLA" RDW  Date Value Ref Range Status  12/19/2020 13.6 11.0 - 15.0 % Final         Passed - Valid encounter within last 6 months    Recent Outpatient Visits           5 months ago Subacromial bursitis of left shoulder joint   [place], WarCammie McgeeD   5 months ago Vertigo   Bro[place] AFBcSusy FrizzleD   11 months ago Acute cough   [place] NP   1 year ago Controlled type 2 diabetes mellitus without complication, without long-term current use of insulin ([place] Bro[place] WarCammie McgeeD   1 year ago Controlled type 2 diabetes mellitus without complication, without long-term current use of insulin ([place] Bro[place] Valley Farmsckard, WarCammie McgeeD                 Requested Prescriptions  Pending Prescriptions Disp Refills   hydrochlorothiazide (HYDRODIURIL) 25 MG tablet [Pharmacy Med Name: HYDROCHLOROTHIAZIDE 25 MG TAB] 90 tablet 0    Sig: TAKE 1 TABLET (25 MG TOTAL) BY MOUTH DAILY.     Cardiovascular: Diuretics - Thiazide Failed - 02/28/2022  2:44 AM      Failed - Cr in normal range and within 180 days    Creat  Date Value Ref Range  Status  12/19/2020 0.87 0.70 - 1.35 mg/dL Final   Creatinine, Urine  Date Value Ref Range Status   03/09/2019 59 20 - 320 mg/dL Final         Failed - K in normal range and within 180 days    Potassium  Date Value Ref Range Status  12/19/2020 4.4 3.5 - 5.3 mmol/L Final         Failed - Na in normal range and within 180 days    Sodium  Date Value Ref Range Status  12/19/2020 137 135 - 146 mmol/L Final         Passed - Last BP in normal range    BP Readings from Last 1 Encounters:  09/11/21 128/80         Passed - Valid encounter within last 6 months    Recent Outpatient Visits           5 months ago Subacromial bursitis of left shoulder joint   [place] Susy Frizzle, MD   5 months ago [place] Susy Frizzle, MD   11 months ago Acute cough   [place] Eulogio Bear, NP   1 year ago Controlled type 2 diabetes mellitus without complication, without long-term current use of insulin ([place])   [place] Pickard, Cammie Mcgee, MD   1 year ago Controlled type 2 diabetes mellitus without complication, without long-term current use of insulin ([place])   [place] Pickard, Cammie Mcgee, MD               metFORMIN (GLUCOPHAGE) 1000 MG tablet [Pharmacy Med Name: METFORMIN HCL 1,000 MG TABLET] 180 tablet 0    Sig: TAKE 1 TABLET BY MOUTH TWICE A DAY     Endocrinology:  Diabetes - Biguanides Failed - 02/28/2022  2:44 AM      Failed - Cr in normal range and within 360 days    Creat  Date Value Ref Range Status  12/19/2020 0.87 0.70 - 1.35 mg/dL Final   Creatinine, Urine  Date Value Ref Range Status  03/09/2019 59 20 - 320 mg/dL Final         Failed - HBA1C is between 0 and 7.9 and within 180 days    Hgb A1c MFr Bld  Date Value Ref Range Status  12/19/2020 8.6 (H) <5.7 % of total Hgb Final    Comment:    For someone without known diabetes, a hemoglobin A1c value of 6.5% or greater indicates that they may have  diabetes and this should be confirmed with a  follow-up  test. . For someone with known diabetes, a value <7% indicates  that their diabetes is well controlled and a value  greater than or equal to 7% indicates suboptimal  control. A1c targets should be individualized based on  duration of diabetes, age, comorbid conditions, and  other considerations. . Currently, no consensus exists regarding use of hemoglobin A1c for diagnosis of diabetes for children. .          Failed - eGFR in normal range and within 360 days    GFR, Est African American  Date Value Ref Range Status  03/09/2020 107 > OR = 60 mL/min/1.57m Final   GFR, Est Non African American  Date Value Ref Range Status  03/09/2020 93 > OR = 60 mL/min/1.761mFinal   eGFR  Date Value Ref Range Status  12/19/2020 96 >  OR = 60 mL/min/1.103m Final    Comment:    The eGFR is based on the CKD-EPI 2021 equation. To calculate  the new eGFR from a previous Creatinine or Cystatin C result, go to https://www.kidney.org/professionals/ kdoqi/gfr%5Fcalculator          Failed - B12 Level in normal range and within 720 days    No results found for: "VITAMINB12"       Failed - CBC within normal limits and completed in the last 12 months    WBC  Date Value Ref Range Status  12/19/2020 8.2 3.8 - 10.8 Thousand/uL Final   RBC  Date Value Ref Range Status  12/19/2020 5.10 4.20 - 5.80 Million/uL Final   Hemoglobin  Date Value Ref Range Status  12/19/2020 15.2 13.2 - 17.1 g/dL Final   HCT  Date Value Ref Range Status  12/19/2020 44.7 38.5 - 50.0 % Final   MCHC  Date Value Ref Range Status  12/19/2020 34.0 32.0 - 36.0 g/dL Final   [facility] Date Value Ref Range Status  12/19/2020 29.8 27.0 - 33.0 pg Final   MCV  Date Value Ref Range Status  12/19/2020 87.6 80.0 - 100.0 fL Final   No results found for: "PLTCOUNTKUC", "LABPLAT", "POCPLA" RDW  Date Value Ref Range Status  12/19/2020 13.6 11.0 - 15.0 % Final         Passed - Valid encounter within last 6 months     Recent Outpatient Visits           5 months ago Subacromial bursitis of left shoulder joint   [place] Frizzle MD   5 months ago Vertigo   B[place] HookPSusy Frizzle MD   11 months ago Acute cough   BCanyon DayMEulogio Bear NP   1 year ago Controlled type 2 diabetes mellitus without complication, without long-term current use of insulin ([place]   [place]ocks[place]ard, WCammie Mcgee MD   1 year ago Controlled type 2 diabetes mellitus without complication, without long-term current use of insulin ([place]   [facility] Pickard, WCammie Mcgee MD

## 2022-03-06 ENCOUNTER — Other Ambulatory Visit: Payer: Self-pay | Admitting: Family Medicine

## 2022-03-10 ENCOUNTER — Other Ambulatory Visit: Payer: Self-pay | Admitting: Family Medicine

## 2022-03-22 ENCOUNTER — Other Ambulatory Visit: Payer: Self-pay | Admitting: Family Medicine

## 2022-04-08 ENCOUNTER — Other Ambulatory Visit: Payer: Medicare HMO

## 2022-04-08 DIAGNOSIS — E119 Type 2 diabetes mellitus without complications: Secondary | ICD-10-CM | POA: Diagnosis not present

## 2022-04-08 DIAGNOSIS — E7800 Pure hypercholesterolemia, unspecified: Secondary | ICD-10-CM

## 2022-04-08 DIAGNOSIS — I10 Essential (primary) hypertension: Secondary | ICD-10-CM

## 2022-04-09 ENCOUNTER — Other Ambulatory Visit: Payer: Self-pay | Admitting: Family Medicine

## 2022-04-09 LAB — COMPLETE METABOLIC PANEL WITH GFR
AG Ratio: 1.4 (calc) (ref 1.0–2.5)
ALT: 30 U/L (ref 9–46)
AST: 29 U/L (ref 10–35)
Albumin: 4.2 g/dL (ref 3.6–5.1)
Alkaline phosphatase (APISO): 53 U/L (ref 35–144)
BUN: 19 mg/dL (ref 7–25)
CO2: 26 mmol/L (ref 20–32)
Calcium: 9.3 mg/dL (ref 8.6–10.3)
Chloride: 100 mmol/L (ref 98–110)
Creat: 0.83 mg/dL (ref 0.70–1.35)
Globulin: 3.1 g/dL (calc) (ref 1.9–3.7)
Glucose, Bld: 177 mg/dL — ABNORMAL HIGH (ref 65–99)
Potassium: 4.4 mmol/L (ref 3.5–5.3)
Sodium: 139 mmol/L (ref 135–146)
Total Bilirubin: 0.7 mg/dL (ref 0.2–1.2)
Total Protein: 7.3 g/dL (ref 6.1–8.1)
eGFR: 97 mL/min/{1.73_m2} (ref >=60)

## 2022-04-09 LAB — CBC WITH DIFFERENTIAL/PLATELET
Absolute Monocytes: 705 cells/uL (ref 200–950)
Basophils Absolute: 139 cells/uL (ref 0–200)
Basophils Relative: 1.7 %
Eosinophils Absolute: 98 cells/uL (ref 15–500)
Eosinophils Relative: 1.2 %
HCT: 40.8 % (ref 38.5–50.0)
Hemoglobin: 13.9 g/dL (ref 13.2–17.1)
Lymphs Abs: 2239 cells/uL (ref 850–3900)
MCH: 29.7 pg (ref 27.0–33.0)
MCHC: 34.1 g/dL (ref 32.0–36.0)
MCV: 87.2 fL (ref 80.0–100.0)
MPV: 11.3 fL (ref 7.5–12.5)
Monocytes Relative: 8.6 %
Neutro Abs: 5018 cells/uL (ref 1500–7800)
Neutrophils Relative %: 61.2 %
Platelets: 170 10*3/uL (ref 140–400)
RBC: 4.68 10*6/uL (ref 4.20–5.80)
RDW: 13 % (ref 11.0–15.0)
Total Lymphocyte: 27.3 %
WBC: 8.2 10*3/uL (ref 3.8–10.8)

## 2022-04-09 LAB — MICROALBUMIN / CREATININE URINE RATIO
Creatinine, Urine: 88 mg/dL (ref 20–320)
Microalb Creat Ratio: 11 mcg/mg creat (ref <30)
Microalb, Ur: 1 mg/dL

## 2022-04-09 LAB — LIPID PANEL
Cholesterol: 142 mg/dL (ref <200)
HDL: 34 mg/dL — ABNORMAL LOW (ref >=40)
LDL Cholesterol (Calc): 76 mg/dL (calc)
Non-HDL Cholesterol (Calc): 108 mg/dL (calc) (ref <130)
Total CHOL/HDL Ratio: 4.2 (calc) (ref <5.0)
Triglycerides: 230 mg/dL — ABNORMAL HIGH (ref <150)

## 2022-04-09 LAB — HEMOGLOBIN A1C
Hgb A1c MFr Bld: 8.1 % of total Hgb — ABNORMAL HIGH (ref <5.7)
Mean Plasma Glucose: 186 mg/dL
eAG (mmol/L): 10.3 mmol/L

## 2022-04-09 LAB — VITAMIN B12: Vitamin B-12: 738 pg/mL (ref 200–1100)

## 2022-04-16 ENCOUNTER — Ambulatory Visit (INDEPENDENT_AMBULATORY_CARE_PROVIDER_SITE_OTHER): Payer: Medicare HMO | Admitting: Family Medicine

## 2022-04-16 ENCOUNTER — Encounter: Payer: Self-pay | Admitting: Family Medicine

## 2022-04-16 VITALS — BP 132/82 | HR 91 | Ht 72.0 in | Wt 245.0 lb

## 2022-04-16 DIAGNOSIS — Z125 Encounter for screening for malignant neoplasm of prostate: Secondary | ICD-10-CM

## 2022-04-16 DIAGNOSIS — E119 Type 2 diabetes mellitus without complications: Secondary | ICD-10-CM | POA: Diagnosis not present

## 2022-04-16 DIAGNOSIS — Z136 Encounter for screening for cardiovascular disorders: Secondary | ICD-10-CM

## 2022-04-16 DIAGNOSIS — I10 Essential (primary) hypertension: Secondary | ICD-10-CM

## 2022-04-16 DIAGNOSIS — Z0000 Encounter for general adult medical examination without abnormal findings: Secondary | ICD-10-CM

## 2022-04-16 DIAGNOSIS — E7800 Pure hypercholesterolemia, unspecified: Secondary | ICD-10-CM | POA: Diagnosis not present

## 2022-04-16 MED ORDER — DEXCOM G7 SENSOR MISC: 1.0000 | 11 refills | Status: DC

## 2022-04-16 MED ORDER — DEXCOM G7 RECEIVER DEVI: 1.0000 | Freq: Once | 1 refills | Status: DC

## 2022-04-16 NOTE — Progress Notes (Signed)
Subjective:    Patient ID: Gerald [name], male    DOB: 12-06-1955, 66 y.o.   MRN: [id number]  HPI  Patient is a very pleasant 66 year old Caucasian male here today for complete physical exam.  Patient had a colonoscopy last year that revealed 2 polyps.  They recommended a repeat colonoscopy in 5 years.  He is due for prostate cancer screening.  Patient quit smoking more than 12 years ago.  He declines a CT scan of the chest to screen for lung cancer.  He does agree to an ultrasound to evaluate for AAA.  His most recent lab work is listed below. Immunization History  Administered Date(s) Administered   Fluad Quad(high Dose 65+) 01/31/2021   Influenza, High Dose Seasonal PF 02/06/2017, 01/30/2018   Influenza,inj,Quad PF,6+ Mos 01/21/2015, 01/18/2016, 03/09/2019, 03/09/2020   Influenza-Unspecified 01/13/2014   PFIZER(Purple Top)SARS-COV-2 Vaccination 08/17/2019, 09/13/2019   Pneumococcal Polysaccharide-23 03/24/2015   Tdap 12/16/2014   Zoster, Live 01/18/2016    Lab on 04/08/2022  Component Date Value Ref Range Status   WBC 04/08/2022 8.2  3.8 - 10.8 Thousand/uL Final   RBC 04/08/2022 4.68  4.20 - 5.80 Million/uL Final   Hemoglobin 04/08/2022 13.9  13.2 - 17.1 g/dL Final   HCT 04/08/2022 40.8  38.5 - 50.0 % Final   MCV 04/08/2022 87.2  80.0 - 100.0 fL Final   MCH 04/08/2022 29.7  27.0 - 33.0 pg Final   MCHC 04/08/2022 34.1  32.0 - 36.0 g/dL Final   RDW 04/08/2022 13.0  11.0 - 15.0 % Final   Platelets 04/08/2022 170  140 - 400 Thousand/uL Final   MPV 04/08/2022 11.3  7.5 - 12.5 fL Final   Neutro Abs 04/08/2022 5,018  1,500 - 7,800 cells/uL Final   Lymphs Abs 04/08/2022 2,239  850 - 3,900 cells/uL Final   Absolute Monocytes 04/08/2022 705  200 - 950 cells/uL Final   Eosinophils Absolute 04/08/2022 98  15 - 500 cells/uL Final   Basophils Absolute 04/08/2022 139  0 - 200 cells/uL Final   Neutrophils Relative % 04/08/2022 61.2  % Final   Total Lymphocyte 04/08/2022 27.3  % Final    Monocytes Relative 04/08/2022 8.6  % Final   Eosinophils Relative 04/08/2022 1.2  % Final   Basophils Relative 04/08/2022 1.7  % Final   Glucose, Bld 04/08/2022 177 (H)  65 - 99 mg/dL Final   Comment: .            Fasting reference interval . For someone without known diabetes, a glucose value >125 mg/dL indicates that they may have diabetes and this should be confirmed with a follow-up test. .    BUN 04/08/2022 19  7 - 25 mg/dL Final   Creat 04/08/2022 0.83  0.70 - 1.35 mg/dL Final   eGFR 04/08/2022 97  > OR = 60 mL/min/1.34m Final   BUN/Creatinine Ratio 04/08/2022 SEE NOTE:  6 - 22 (calc) Final   Comment:    Not Reported: BUN and Creatinine are within    reference range. .    Sodium 04/08/2022 139  135 - 146 mmol/L Final   Potassium 04/08/2022 4.4  3.5 - 5.3 mmol/L Final   Chloride 04/08/2022 100  98 - 110 mmol/L Final   CO2 04/08/2022 26  20 - 32 mmol/L Final   Calcium 04/08/2022 9.3  8.6 - 10.3 mg/dL Final   Total Protein 04/08/2022 7.3  6.1 - 8.1 g/dL Final   Albumin 04/08/2022 4.2  3.6 - 5.1 g/dL Final  Globulin 04/08/2022 3.1  1.9 - 3.7 g/dL (calc) Final   AG Ratio 04/08/2022 1.4  1.0 - 2.5 (calc) Final   Total Bilirubin 04/08/2022 0.7  0.2 - 1.2 mg/dL Final   Alkaline phosphatase (APISO) 04/08/2022 53  35 - 144 U/L Final   AST 04/08/2022 29  10 - 35 U/L Final   ALT 04/08/2022 30  9 - 46 U/L Final   Hgb A1c MFr Bld 04/08/2022 8.1 (H)  <5.7 % of total Hgb Final   Comment: For someone without known diabetes, a hemoglobin A1c value of 6.5% or greater indicates that they may have  diabetes and this should be confirmed with a follow-up  test. . For someone with known diabetes, a value <7% indicates  that their diabetes is well controlled and a value  greater than or equal to 7% indicates suboptimal  control. A1c targets should be individualized based on  duration of diabetes, age, comorbid conditions, and  other considerations. . Currently, no consensus exists  regarding use of hemoglobin A1c for diagnosis of diabetes for children. .    Mean Plasma Glucose 04/08/2022 186  mg/dL Final   eAG (mmol/L) 04/08/2022 10.3  mmol/L Final   Cholesterol 04/08/2022 142  <200 mg/dL Final   HDL 04/08/2022 34 (L)  > OR = 40 mg/dL Final   Triglycerides 04/08/2022 230 (H)  <150 mg/dL Final   Comment: . If a non-fasting specimen was collected, consider repeat triglyceride testing on a fasting specimen if clinically indicated.  Yates Decamp et al. J. of Clin. Lipidol. 6333;5:456-256. Marland Kitchen    LDL Cholesterol (Calc) 04/08/2022 76  mg/dL (calc) Final   Comment: Reference range: <100 . Desirable range <100 mg/dL for primary prevention;   <70 mg/dL for patients with CHD or diabetic patients  with > or = 2 CHD risk factors. Marland Kitchen LDL-C is now calculated using the Martin-Hopkins  calculation, which is a validated novel method providing  better accuracy than the Friedewald equation in the  estimation of LDL-C.  Cresenciano Genre et al. Annamaria Helling. 3893;734(28): 2061-2068  (http://education.QuestDiagnostics.com/faq/FAQ164)    Total CHOL/HDL Ratio 04/08/2022 4.2  <5.0 (calc) Final   Non-HDL Cholesterol (Calc) 04/08/2022 108  <130 mg/dL (calc) Final   Comment: For patients with diabetes plus 1 major ASCVD risk  factor, treating to a non-HDL-C goal of <100 mg/dL  (LDL-C of <70 mg/dL) is considered a therapeutic  option.    Creatinine, Urine 04/08/2022 88  20 - 320 mg/dL Final   Microalb, Ur 04/08/2022 1.0  mg/dL Final   Comment: Reference Range Not established    Microalb Creat Ratio 04/08/2022 11  <30 mcg/mg creat Final   Comment: . The ADA defines abnormalities in albumin excretion as follows: Marland Kitchen Albuminuria Category        Result (mcg/mg creatinine) . Normal to Mildly increased   <30 Moderately increased         30-299  Severely increased           > OR = 300 . The ADA recommends that at least two of three specimens collected within a 3-6 month period be abnormal before  considering a patient to be within a diagnostic category.    Vitamin B-12 04/08/2022 738  200 - 1,100 pg/mL Final    Past Medical History:  Diagnosis Date   Abnormal LFTs (liver function tests) 12/2012   Allergy    Anal condyloma    Anemia 07/2010   Diabetes mellitus    Gastric ulcer 07/2010  bleeding GU, clipped and injected with epi. non-bleeding ulcer at [facility]. biopsy negative for H pylori.  on follow up EGD 08/2010, ulcers totally heled   GERD with stricture    Hyperlipemia    Unspecified essential hypertension    Upper GI bleed    Past Surgical History:  Procedure Laterality Date   COLONOSCOPY  10/2010, 04/09/2017   sessile polyp (path: tubular adenoma) in tranverse colon. Dr Deatra Ina   ESOPHAGOGASTRODUODENOSCOPY  07/24/2010   for hematemesis.  bleeding GU with VV, injected with epi and endoclipped.  second, non-bleeding ulcer at [facility]. biopsies negative for h pylori or dysplasia   ESOPHAGOGASTRODUODENOSCOPY  08/2010   to assess the gastric ulcer.  study was normal.  no residual ulcers.  Dr Deatra Ina   ESOPHAGOGASTRODUODENOSCOPY N/A 07/10/2017   Procedure: ESOPHAGOGASTRODUODENOSCOPY (EGD);  Surgeon: Jerene Bears, MD;  Location: [facility];  Service: Gastroenterology;  Laterality: N/A;   ESOPHAGOGASTRODUODENOSCOPY (EGD) WITH PROPOFOL N/A 03/08/2016   Procedure: ESOPHAGOGASTRODUODENOSCOPY (EGD) WITH PROPOFOL;  Surgeon: Jerene Bears, MD;  Location: [facility];  Service: Gastroenterology;  Laterality: N/A;   GANGLION CYST EXCISION     POLYPECTOMY     UPPER GASTROINTESTINAL ENDOSCOPY     Current Outpatient Medications on File Prior to Visit  Medication Sig Dispense Refill   aspirin EC 81 MG tablet Take 81 mg by mouth at bedtime.     BD PEN NEEDLE NANO 2ND GEN 32G X 4 MM MISC USE AS DIRECTED TO INJECT INSULIN UNDER THE SKIN ONCE DAILY 100 each 1   dapagliflozin propanediol (FARXIGA) 10 MG TABS tablet Take 1 tablet (10 mg total) by mouth daily. 90 tablet 3   glipiZIDE (GLUCOTROL  XL) 10 MG 24 hr tablet TAKE 1 TABLET BY MOUTH EVERY DAY 90 tablet 1   hydrochlorothiazide (HYDRODIURIL) 25 MG tablet TAKE 1 TABLET (25 MG TOTAL) BY MOUTH DAILY. 90 tablet 0   Insulin Glargine (BASAGLAR KWIKPEN) 100 UNIT/ML INJECT 10 TO 20 UNITS INTO THE SKIN DAILY 15 mL 5   Lancets (ONETOUCH ULTRASOFT) lancets USE TO CHECK BLOOD SUGAR 3 TIMES DAILY.ICD10:R73.01 100 each 12   losartan (COZAAR) 100 MG tablet Take 1 tablet by mouth once daily 90 tablet 0   metFORMIN (GLUCOPHAGE) 1000 MG tablet TAKE 1 TABLET BY MOUTH TWICE A DAY 180 tablet 0   Misc Natural Products (TRIPLE FLEX) CAPS Take 1 capsule by mouth.     montelukast (SINGULAIR) 10 MG tablet TAKE 1 TABLET BY MOUTH EVERYDAY AT BEDTIME 90 tablet 0   Multiple Vitamin (MULTIVITAMIN PO) Take by mouth.     naproxen sodium (ANAPROX) 220 MG tablet Take 440 mg by mouth 2 (two) times daily with a meal.     omeprazole (PRILOSEC) 40 MG capsule TAKE 1 CAPSULE (40 MG TOTAL) BY MOUTH DAILY. 90 capsule 3   ONETOUCH VERIO test strip USE TO CHECK BLOOD SUGAR 3 TIMES DAILY.ICD10:R73.01 100 strip 3   pravastatin (PRAVACHOL) 20 MG tablet Take 1 tablet by mouth once daily 90 tablet 0   sildenafil (VIAGRA) 100 MG tablet TAKE ONE-HALF (1/2) TO ONE TABLET DAILY AS NEEDED FOR ERECTILE DYSFUNCTION 30 tablet 3   No current facility-administered medications on file prior to visit.   No Known Allergies Social History   Socioeconomic History   Marital status: Married    Spouse name: Not on file   Number of children: Not on file   Years of education: Not on file   Highest education level: Not on file  Occupational  History   Occupation: Lexicographer: [employer]  Tobacco Use   Smoking status: Former   Smokeless tobacco: Former    Types: Nurse, children's Use: Never used  Substance and Sexual Activity   Alcohol use: Yes    Alcohol/week: 2.0 standard drinks of alcohol    Types: 2 Standard drinks or equivalent per week   Drug use: No   Sexual  activity: Not on file  Other Topics Concern   Not on file  Social History Narrative   Not on file   Social Determinants of Health   Financial Resource Strain: Not on file  Food Insecurity: Not on file  Transportation Needs: Not on file  Physical Activity: Not on file  Stress: Not on file  Social Connections: Not on file  Intimate Partner Violence: Not on file   Family History  Problem Relation Age of Onset   Diabetes Brother    Stomach cancer Mother    Colon cancer Neg Hx    Colon polyps Neg Hx    Esophageal cancer Neg Hx    Rectal cancer Neg Hx       Review of Systems  All other systems reviewed and are negative.      Objective:   Physical Exam Vitals reviewed.  Constitutional:      General: He is not in acute distress.    Appearance: He is well-developed. He is not diaphoretic.  HENT:     Head: Normocephalic and atraumatic.     Right Ear: External ear normal.     Left Ear: External ear normal.     Nose: Nose normal.     Mouth/Throat:     Pharynx: No oropharyngeal exudate.  Eyes:     General: No scleral icterus.       Right eye: No discharge.        Left eye: No discharge.     Conjunctiva/sclera: Conjunctivae normal.     Pupils: Pupils are equal, round, and reactive to light.  Neck:     Thyroid: No thyromegaly.     Vascular: No JVD.     Trachea: No tracheal deviation.  Cardiovascular:     Rate and Rhythm: Normal rate and regular rhythm.     Heart sounds: Normal heart sounds. No murmur heard.    No friction rub. No gallop.  Pulmonary:     Effort: Pulmonary effort is normal. No respiratory distress.     Breath sounds: Normal breath sounds. No stridor. No wheezing or rales.  Chest:     Chest wall: No tenderness.  Abdominal:     General: Bowel sounds are normal. There is no distension.     Palpations: Abdomen is soft. There is no mass.     Tenderness: There is no abdominal tenderness. There is no guarding or rebound.  Musculoskeletal:        General:  No tenderness or deformity. Normal range of motion.     Cervical back: Normal range of motion and neck supple.  Lymphadenopathy:     Cervical: No cervical adenopathy.  Skin:    General: Skin is warm.     Coloration: Skin is not pale.     Findings: No erythema or rash.  Neurological:     Mental Status: He is alert and oriented to person, place, and time.     Cranial Nerves: No cranial nerve deficit.     Motor: No abnormal muscle tone.  Coordination: Coordination normal.     Deep Tendon Reflexes: Reflexes are normal and symmetric.  Psychiatric:        Behavior: Behavior normal.        Thought Content: Thought content normal.        Judgment: Judgment normal.         Assessment & Plan:  Prostate cancer screening - Plan: PSA  Encounter for abdominal aortic aneurysm (AAA) screening - Plan: [place] MEDICARE SCREENING  Essential hypertension  Pure hypercholesterolemia  Controlled type 2 diabetes mellitus without complication, without long-term current use of insulin ([place])  General medical exam Spent majority of our time discussing his elevated A1c.  Recommended that he increase his insulin 1 unit every day until his fasting blood sugars less than 130.  Would then focus on getting his 2-hour postprandial sugars between 80 and 160.  Gave the patient a prescription for continuous blood glucose monitor to facilitate him being able to do that.  Check an ultrasound to evaluate for AAA.  Check a PSA to screen for prostate cancer.  Colonoscopy is up-to-date.  Immunizations are up-to-date.  Blood pressure is good.

## 2022-04-17 LAB — PSA: PSA: 0.28 ng/mL (ref <=4.00)

## 2022-04-18 ENCOUNTER — Telehealth: Payer: Self-pay | Admitting: Family Medicine

## 2022-04-18 ENCOUNTER — Other Ambulatory Visit: Payer: Self-pay | Admitting: Family Medicine

## 2022-04-18 MED ORDER — MECLIZINE HCL 25 MG PO TABS: 25.0000 mg | ORAL_TABLET | Freq: Three times a day (TID) | ORAL | 0 refills | Status: AC | PRN

## 2022-04-18 NOTE — Telephone Encounter (Signed)
Patients wife called states patient was seen on Tuesday and was told he had some vertigo-- she is requesting a medication called in for this. The [facility] uses [facility] [address].  CB# [id number]

## 2022-04-19 ENCOUNTER — Telehealth: Payer: Self-pay | Admitting: Family Medicine

## 2022-04-19 ENCOUNTER — Other Ambulatory Visit: Payer: Self-pay

## 2022-04-19 MED ORDER — MONTELUKAST SODIUM 10 MG PO TABS: ORAL_TABLET | ORAL | 3 refills | Status: DC

## 2022-04-19 NOTE — Telephone Encounter (Signed)
Prescription Request  04/19/2022  Is this a "Controlled Substance" medicine? No  LOV: 04/16/2022  What is the name of the medication or equipment?   montelukast (SINGULAIR) 10 MG tablet [[id number]]   Have you contacted your pharmacy to request a refill? Yes   Which pharmacy would you like this sent to?  [id number]: [phone]: [phone]   Patient notified that their request is being sent to the clinical staff for review and that they should receive a response within 2 business days.   Please advise pharmacist at [phone]

## 2022-04-19 NOTE — Telephone Encounter (Signed)
Called pt this morning, aware meclizine was sent to pharmacy.   Pt noted that he had picked up yesterday already, nothing further.

## 2022-04-24 ENCOUNTER — Telehealth: Payer: Self-pay | Admitting: Family Medicine

## 2022-04-24 NOTE — Telephone Encounter (Signed)
Erroneous encounter. Please disregard.

## 2022-04-25 ENCOUNTER — Other Ambulatory Visit: Payer: Self-pay

## 2022-04-25 DIAGNOSIS — E119 Type 2 diabetes mellitus without complications: Secondary | ICD-10-CM

## 2022-04-25 MED ORDER — FREESTYLE LIBRE 3 SENSOR MISC: 6 refills | Status: DC

## 2022-04-25 MED ORDER — FREESTYLE LIBRE 3 READER DEVI: 1.0000 | Freq: Every day | 1 refills | Status: DC

## 2022-04-26 ENCOUNTER — Other Ambulatory Visit: Payer: Self-pay

## 2022-04-26 ENCOUNTER — Telehealth: Payer: Self-pay

## 2022-04-26 DIAGNOSIS — E119 Type 2 diabetes mellitus without complications: Secondary | ICD-10-CM

## 2022-04-26 MED ORDER — FREESTYLE LIBRE 3 READER DEVI: 1.0000 [IU] | Freq: Every day | 1 refills | Status: DC

## 2022-04-26 MED ORDER — FREESTYLE LIBRE 3 SENSOR MISC: 3 refills | Status: DC

## 2022-04-26 NOTE — Telephone Encounter (Signed)
Insurance will not cover Dexcom G7. Prior authorization required. FreeStyle Libre 3 Reader a PA is Not Required. New Rx for FreeStyle sent to pt's pharmacy. Mjp,lpn

## 2022-05-02 ENCOUNTER — Ambulatory Visit
Admission: RE | Admit: 2022-05-02 | Discharge: 2022-05-02 | Disposition: A | Discharge status: Home / Self Care | Payer: Medicare HMO | Source: Ambulatory Visit | Attending: Family Medicine | Admitting: Family Medicine

## 2022-05-02 DIAGNOSIS — Z136 Encounter for screening for cardiovascular disorders: Secondary | ICD-10-CM

## 2022-05-02 DIAGNOSIS — Z87891 Personal history of nicotine dependence: Secondary | ICD-10-CM | POA: Diagnosis not present

## 2022-05-07 ENCOUNTER — Other Ambulatory Visit: Payer: Self-pay | Admitting: Family Medicine

## 2022-05-07 DIAGNOSIS — E119 Type 2 diabetes mellitus without complications: Secondary | ICD-10-CM

## 2022-05-07 MED ORDER — FREESTYLE LIBRE 3 READER DEVI: 1.0000 [IU] | Freq: Every day | 1 refills | Status: DC

## 2022-05-17 ENCOUNTER — Other Ambulatory Visit: Payer: Self-pay

## 2022-05-17 DIAGNOSIS — E119 Type 2 diabetes mellitus without complications: Secondary | ICD-10-CM

## 2022-05-21 ENCOUNTER — Telehealth: Payer: Self-pay

## 2022-05-21 NOTE — Telephone Encounter (Signed)
Pt's wife called in stating that pt is still having difficulties with his blood sugar device. Pt would like for nurse to give a cb please. Please advise.  Cb#: [phone]

## 2022-05-29 ENCOUNTER — Telehealth: Payer: Self-pay | Admitting: Family Medicine

## 2022-05-29 NOTE — Telephone Encounter (Signed)
Prescription Request  05/29/2022  Is this a "Controlled Substance" medicine? No  LOV: 04/16/2022  What is the name of the medication or equipment?   ONETOUCH VERIO test strip [[id number]]  DISCONTINUED **PATIENT REQUESTING REFILL OR NEW SCRIPT**   Have you contacted your pharmacy to request a refill? Yes   Which pharmacy would you like this sent to?  [id number] [address]: [phone]: [phone]   Patient notified that their request is being sent to the clinical staff for review and that they should receive a response within 2 business days.   Please advise pharmacist at [phone]

## 2022-05-30 ENCOUNTER — Other Ambulatory Visit: Payer: Self-pay | Admitting: Family Medicine

## 2022-05-30 ENCOUNTER — Other Ambulatory Visit: Payer: Self-pay

## 2022-05-30 DIAGNOSIS — E119 Type 2 diabetes mellitus without complications: Secondary | ICD-10-CM

## 2022-05-30 MED ORDER — BLOOD GLUCOSE TEST STRIPS 333 VI STRP: ORAL_STRIP | 3 refills | Status: DC

## 2022-06-04 ENCOUNTER — Other Ambulatory Visit: Payer: Self-pay | Admitting: Family Medicine

## 2022-06-05 NOTE — Telephone Encounter (Signed)
Requested Prescriptions  Pending Prescriptions Disp Refills   Insulin Glargine (BASAGLAR KWIKPEN) 100 UNIT/ML [Pharmacy Med Name: Basaglar KwikPen 100 UNIT/ML Subcutaneous Solution Pen-injector] 15 mL 0    Sig: INJECT 10 TO [place]     Endocrinology:  Diabetes - Insulins Failed - 06/04/2022 10:15 AM      Failed - HBA1C is between 0 and 7.9 and within 180 days    Hgb A1c MFr Bld  Date Value Ref Range Status  04/08/2022 8.1 (H) <5.7 % of total Hgb Final    Comment:    For someone without known diabetes, a hemoglobin A1c value of 6.5% or greater indicates that they may have  diabetes and this should be confirmed with a follow-up  test. . For someone with known diabetes, a value <7% indicates  that their diabetes is well controlled and a value  greater than or equal to 7% indicates suboptimal  control. A1c targets should be individualized based on  duration of diabetes, age, comorbid conditions, and  other considerations. . Currently, no consensus exists regarding use of hemoglobin A1c for diagnosis of diabetes for children. .          Failed - Valid encounter within last 6 months    Recent Outpatient Visits           8 months ago Subacromial bursitis of left shoulder joint   [place] Pickard, Cammie Mcgee, MD   9 months ago [place] Dennard Schaumann, Cammie Mcgee, MD   1 year ago Acute cough   [place] Noemi Chapel A, NP   1 year ago Controlled type 2 diabetes mellitus without complication, without long-term current use of insulin ([place])   [place] Susy Frizzle, MD   1 year ago Controlled type 2 diabetes mellitus without complication, without long-term current use of insulin ([place])   [facility] Pickard, Cammie Mcgee, MD

## 2022-06-07 ENCOUNTER — Telehealth: Payer: Self-pay

## 2022-06-07 NOTE — Telephone Encounter (Signed)
Pt's wife called in stating that this med Insulin Glargine (BASAGLAR KWIKPEN) 100 UNIT/ML [id number] was called in to the pharmacy but the dosage amount was not changed to 40 units and insurance will not cover the med. Pt's wife asks if the dosage amount could be changed to 40 units to get this refilled as pt will be out of this med on Sunday. Please advise.  Cb#: [phone]

## 2022-06-07 NOTE — Telephone Encounter (Signed)
Erroneous encounter. Please disregard.

## 2022-06-10 ENCOUNTER — Other Ambulatory Visit: Payer: Self-pay | Admitting: Family Medicine

## 2022-06-10 MED ORDER — BASAGLAR KWIKPEN 100 UNIT/ML ~~LOC~~ SOPN: 40.0000 [IU] | PEN_INJECTOR | Freq: Every day | SUBCUTANEOUS | 5 refills | Status: DC

## 2022-06-10 NOTE — Telephone Encounter (Signed)
Pt's wife called in stating that this med Insulin Glargine (BASAGLAR KWIKPEN) 100 UNIT/ML [id number] was called in to the pharmacy but the dosage amount was not changed to 40 units and insurance will not cover the med. Pt's wife asks if the dosage amount could be changed to 40 units to get this refilled as pt will be out of this med on Sunday. Please advise.

## 2022-06-12 ENCOUNTER — Telehealth: Payer: Self-pay

## 2022-06-12 ENCOUNTER — Other Ambulatory Visit: Payer: Self-pay

## 2022-06-12 DIAGNOSIS — E119 Type 2 diabetes mellitus without complications: Secondary | ICD-10-CM

## 2022-06-12 MED ORDER — DEXCOM G7 SENSOR MISC: 6 refills | Status: DC

## 2022-06-12 MED ORDER — DEXCOM G7 RECEIVER DEVI: 1 refills | Status: DC

## 2022-06-12 NOTE — Telephone Encounter (Signed)
Gerald [name] ([id number]:[id number]) Need Help? Call us at [phone] Outcome Additional Information Required The patient currently has access to the requested medication and a Prior Authorization is not needed for the patient/medication. Drug Basaglar KwikPen 100UNIT/ML pen-injectors Form Teacher, early years/pre PA Form [phone] NCPDP)  Pt aware. Mjp,lpn

## 2022-06-17 ENCOUNTER — Ambulatory Visit (INDEPENDENT_AMBULATORY_CARE_PROVIDER_SITE_OTHER): Payer: Medicare HMO | Admitting: Family Medicine

## 2022-06-17 ENCOUNTER — Encounter: Payer: Self-pay | Admitting: Family Medicine

## 2022-06-17 VITALS — BP 130/80 | HR 93 | Temp 98.3°F | Ht 72.0 in | Wt 245.0 lb

## 2022-06-17 DIAGNOSIS — H8110 Benign paroxysmal vertigo, unspecified ear: Secondary | ICD-10-CM

## 2022-06-17 MED ORDER — LEVOCETIRIZINE DIHYDROCHLORIDE 5 MG PO TABS: 5.0000 mg | ORAL_TABLET | Freq: Every evening | ORAL | 5 refills | Status: DC

## 2022-06-17 NOTE — Progress Notes (Signed)
Subjective:    Patient ID: Gerald [name], male    DOB: 09/27/1955, 67 y.o.   MRN: [id number]  Dizziness  Patient has been battling vertigo off and on now since May 2023.  He states that whenever he lies down flat on his back to the room and start to spin.  Occasionally when he sits up the room will start to spin.  He denies any nausea or vomiting.  He denies any hearing loss.  He denies any headaches or tinnitus. Past Medical History:  Diagnosis Date   Abnormal LFTs (liver function tests) 12/2012   Allergy    Anal condyloma    Anemia 07/2010   Diabetes mellitus    Gastric ulcer 07/2010   bleeding GU, clipped and injected with epi. non-bleeding ulcer at [employer]. biopsy negative for H pylori.  on follow up EGD 08/2010, ulcers totally heled   GERD with stricture    Hyperlipemia    Unspecified essential hypertension    Upper GI bleed    Past Surgical History:  Procedure Laterality Date   COLONOSCOPY  10/2010, 04/09/2017   sessile polyp (path: tubular adenoma) in tranverse colon. Dr Deatra Ina   ESOPHAGOGASTRODUODENOSCOPY  07/24/2010   for hematemesis.  bleeding GU with VV, injected with epi and endoclipped.  second, non-bleeding ulcer at [facility]. biopsies negative for h pylori or dysplasia   ESOPHAGOGASTRODUODENOSCOPY  08/2010   to assess the gastric ulcer.  study was normal.  no residual ulcers.  Dr Deatra Ina   ESOPHAGOGASTRODUODENOSCOPY N/A 07/10/2017   Procedure: ESOPHAGOGASTRODUODENOSCOPY (EGD);  Surgeon: Jerene Bears, MD;  Location: [facility];  Service: Gastroenterology;  Laterality: N/A;   ESOPHAGOGASTRODUODENOSCOPY (EGD) WITH PROPOFOL N/A 03/08/2016   Procedure: ESOPHAGOGASTRODUODENOSCOPY (EGD) WITH PROPOFOL;  Surgeon: Jerene Bears, MD;  Location: [facility];  Service: Gastroenterology;  Laterality: N/A;   GANGLION CYST EXCISION     POLYPECTOMY     UPPER GASTROINTESTINAL ENDOSCOPY     Current Outpatient Medications on File Prior to Visit  Medication Sig Dispense Refill    aspirin EC 81 MG tablet Take 81 mg by mouth at bedtime.     BD PEN NEEDLE NANO 2ND GEN 32G X 4 MM MISC USE AS DIRECTED TO INJECT INSULIN UNDER THE SKIN ONCE DAILY 100 each 1   Continuous Blood Gluc Receiver (DEXCOM G7 RECEIVER) DEVI Use to continuously monitor blood sugars daily. 1 each 1   Continuous Blood Gluc Sensor (DEXCOM G7 SENSOR) MISC Use to continuously monitor blood sugars daily. Change sensor every 14 days. 4 each 6   dapagliflozin propanediol (FARXIGA) 10 MG TABS tablet Take 1 tablet (10 mg total) by mouth daily. 90 tablet 3   glipiZIDE (GLUCOTROL XL) 10 MG 24 hr tablet TAKE 1 TABLET BY MOUTH EVERY DAY 90 tablet 1   Glucose Blood (BLOOD GLUCOSE TEST STRIPS 333) STRP Use daily to check blood sugars 4 times per day. 100 strip 3   hydrochlorothiazide (HYDRODIURIL) 25 MG tablet TAKE 1 TABLET (25 MG TOTAL) BY MOUTH DAILY. 90 tablet 0   Insulin Glargine (BASAGLAR KWIKPEN) 100 UNIT/ML Inject 40 Units into the skin daily. 15 mL 6   Lancets (ONETOUCH ULTRASOFT) lancets USE TO CHECK BLOOD SUGAR 3 TIMES DAILY.ICD10:R73.01 100 each 12   losartan (COZAAR) 100 MG tablet Take 1 tablet by mouth once daily 90 tablet 0   meclizine (ANTIVERT) 25 MG tablet Take 1 tablet (25 mg total) by mouth 3 (three) times daily as needed for dizziness. 30 tablet 0  metFORMIN (GLUCOPHAGE) 1000 MG tablet TAKE 1 TABLET BY MOUTH TWICE A DAY 180 tablet 0   Misc Natural Products (TRIPLE FLEX) CAPS Take 1 capsule by mouth.     montelukast (SINGULAIR) 10 MG tablet TAKE 1 TABLET BY MOUTH EVERYDAY AT BEDTIME 90 tablet 3   Multiple Vitamin (MULTIVITAMIN PO) Take by mouth.     naproxen sodium (ANAPROX) 220 MG tablet Take 440 mg by mouth 2 (two) times daily with a meal.     omeprazole (PRILOSEC) 40 MG capsule TAKE 1 CAPSULE (40 MG TOTAL) BY MOUTH DAILY. 90 capsule 3   pravastatin (PRAVACHOL) 20 MG tablet Take 1 tablet by mouth once daily 90 tablet 0   sildenafil (VIAGRA) 100 MG tablet TAKE ONE-HALF (1/2) TO ONE TABLET DAILY AS  NEEDED FOR ERECTILE DYSFUNCTION 30 tablet 3   No current facility-administered medications on file prior to visit.   No Known Allergies Social History   Socioeconomic History   Marital status: Married    Spouse name: Not on file   Number of children: Not on file   Years of education: Not on file   Highest education level: Not on file  Occupational History   Occupation: Welder     Employer: [employer]  Tobacco Use   Smoking status: Former   Smokeless tobacco: Former    Types: Nurse, children's Use: Never used  Substance and Sexual Activity   Alcohol use: Yes    Alcohol/week: 2.0 standard drinks of alcohol    Types: 2 Standard drinks or equivalent per week   Drug use: No   Sexual activity: Not on file  Other Topics Concern   Not on file  Social History Narrative   Not on file   Social Determinants of Health   Financial Resource Strain: Not on file  Food Insecurity: Not on file  Transportation Needs: Not on file  Physical Activity: Not on file  Stress: Not on file  Social Connections: Not on file  Intimate Partner Violence: Not on file   Family History  Problem Relation Age of Onset   Diabetes Brother    Stomach cancer Mother    Colon cancer Neg Hx    Colon polyps Neg Hx    Esophageal cancer Neg Hx    Rectal cancer Neg Hx       Review of Systems  Neurological:  Positive for dizziness.  All other systems reviewed and are negative.      Objective:   Physical Exam Vitals reviewed.  Constitutional:      General: He is not in acute distress.    Appearance: He is well-developed. He is not diaphoretic.  HENT:     Head: Normocephalic and atraumatic.     Right Ear: Tympanic membrane and external ear normal.     Left Ear: Tympanic membrane and external ear normal.     Nose: Nose normal.     Mouth/Throat:     Pharynx: No oropharyngeal exudate.  Eyes:     General: No scleral icterus.       Right eye: No discharge.        Left eye: No discharge.      Conjunctiva/sclera: Conjunctivae normal.     Pupils: Pupils are equal, round, and reactive to light.  Neck:     Thyroid: No thyromegaly.     Vascular: No JVD.     Trachea: No tracheal deviation.  Cardiovascular:     Rate and Rhythm: Normal rate and  regular rhythm.     Heart sounds: Normal heart sounds. No murmur heard.    No friction rub. No gallop.  Pulmonary:     Effort: Pulmonary effort is normal. No respiratory distress.     Breath sounds: Normal breath sounds. No stridor. No wheezing or rales.  Chest:     Chest wall: No tenderness.  Musculoskeletal:     Left shoulder: No swelling, deformity or bony tenderness. Decreased range of motion. Normal strength.     Cervical back: Normal range of motion and neck supple.  Lymphadenopathy:     Cervical: No cervical adenopathy.  Skin:    General: Skin is warm.  Neurological:     General: No focal deficit present.     Mental Status: He is alert and oriented to person, place, and time. Mental status is at baseline.     Cranial Nerves: No cranial nerve deficit.     Motor: No weakness or abnormal muscle tone.     Coordination: Coordination normal.     Gait: Gait normal.     Deep Tendon Reflexes: Reflexes are normal and symmetric.        Assessment & Plan:  Benign paroxysmal positional vertigo, unspecified laterality - Plan: Ambulatory referral to Physical Therapy Symptoms sound consistent with BPPV.  I will refer the patient to physical therapy for Epley maneuvers

## 2022-06-19 ENCOUNTER — Encounter: Payer: Self-pay | Admitting: Physical Therapy

## 2022-06-19 ENCOUNTER — Other Ambulatory Visit: Payer: Self-pay

## 2022-06-19 ENCOUNTER — Ambulatory Visit (INDEPENDENT_AMBULATORY_CARE_PROVIDER_SITE_OTHER): Payer: Medicare HMO | Admitting: Physical Therapy

## 2022-06-19 DIAGNOSIS — H8111 Benign paroxysmal vertigo, right ear: Secondary | ICD-10-CM

## 2022-06-19 DIAGNOSIS — R42 Dizziness and giddiness: Secondary | ICD-10-CM | POA: Diagnosis not present

## 2022-06-19 NOTE — Therapy (Signed)
OUTPATIENT PHYSICAL THERAPY VESTIBULAR EVALUATION     Patient Name: Gerald [name] MRN: [id number] DOB:24-Dec-1955, 67 y.o., male Today's Date: 06/19/2022  END OF SESSION:  PT End of Session - 06/19/22 1453     Visit Number 1    Number of Visits 4    Date for PT Re-Evaluation 07/17/22    Authorization Type [employer]    Progress Note Due on Visit 10    PT Start Time 1425    PT Stop Time 1450    PT Time Calculation (min) 25 min    Activity Tolerance Patient tolerated treatment well    Behavior During Therapy [facility] for tasks assessed/performed             Past Medical History:  Diagnosis Date   Abnormal LFTs (liver function tests) 12/2012   Allergy    Anal condyloma    Anemia 07/2010   Diabetes mellitus    Gastric ulcer 07/2010   bleeding GU, clipped and injected with epi. non-bleeding ulcer at [place]. biopsy negative for H pylori.  on follow up EGD 08/2010, ulcers totally heled   GERD with stricture    Hyperlipemia    Unspecified essential hypertension    Upper GI bleed    Past Surgical History:  Procedure Laterality Date   COLONOSCOPY  10/2010, 04/09/2017   sessile polyp (path: tubular adenoma) in tranverse colon. Dr Deatra Ina   ESOPHAGOGASTRODUODENOSCOPY  07/24/2010   for hematemesis.  bleeding GU with VV, injected with epi and endoclipped.  second, non-bleeding ulcer at [facility]. biopsies negative for h pylori or dysplasia   ESOPHAGOGASTRODUODENOSCOPY  08/2010   to assess the gastric ulcer.  study was normal.  no residual ulcers.  Dr Deatra Ina   ESOPHAGOGASTRODUODENOSCOPY N/A 07/10/2017   Procedure: ESOPHAGOGASTRODUODENOSCOPY (EGD);  Surgeon: Jerene Bears, MD;  Location: [facility];  Service: Gastroenterology;  Laterality: N/A;   ESOPHAGOGASTRODUODENOSCOPY (EGD) WITH PROPOFOL N/A 03/08/2016   Procedure: ESOPHAGOGASTRODUODENOSCOPY (EGD) WITH PROPOFOL;  Surgeon: Jerene Bears, MD;  [facility];  Service: Gastroenterology;  Laterality: N/A;   GANGLION CYST  EXCISION     POLYPECTOMY     UPPER GASTROINTESTINAL ENDOSCOPY     Patient Active Problem List   Diagnosis Date Noted   Food impaction of esophagus    Esophageal obstruction due to food impaction    Gastric ulcer with hemorrhage 09/13/2010   Special screening for malignant neoplasms, colon 09/13/2010    PCP: Susy Frizzle, MD REFERRING PROVIDER: Susy Frizzle, MD  REFERRING DIAG: H81.10 (ICD-10-CM) - Benign paroxysmal positional vertigo, unspecified laterality  THERAPY DIAG:  BPPV (benign paroxysmal positional vertigo), right - Plan: PT plan of care cert/re-cert  Dizziness and giddiness - Plan: PT plan of care cert/re-cert  ONSET DATE: ~ 6 months  Rationale for Evaluation and Treatment: Rehabilitation  SUBJECTIVE:   SUBJECTIVE STATEMENT: Pt reports continued episodes of vertigo x 6 months.  Symptoms have improved.  He reports symptoms mainly when lying down lasting only a few seconds.   Pt accompanied by: significant other  PERTINENT HISTORY: DM, HLD, HTN  PAIN:  Are you having pain? No  PRECAUTIONS: None  WEIGHT BEARING RESTRICTIONS: No  FALLS: Has patient fallen in last 6 months? No  LIVING ENVIRONMENT: Lives with: lives with their spouse Lives in: House/apartment  PLOF: Independent and Leisure: works part time: HVAC   PATIENT GOALS: improve vertigo  OBJECTIVE:   COGNITION: Overall cognitive status: Within functional limits for tasks assessed   POSTURE:  rounded shoulders and forward head  GAIT: Comments: Independent with ambulation   VESTIBULAR ASSESSMENT:  GENERAL OBSERVATION: no symptoms, feels it's on the Rt side   SYMPTOM BEHAVIOR:  Subjective history: see above  Type of dizziness: Imbalance (Disequilibrium), Spinning/Vertigo, and Lightheadedness/Faint  Frequency: when lying down  Duration: seconds  Aggravating factors: Induced by position change: lying supine and Induced by motion: turning body quickly and turning head  quickly  Relieving factors: lying supine and resolves quickly  Progression of symptoms: better  OCULOMOTOR EXAM:  Ocular Alignment: normal  Ocular ROM: No Limitations  Spontaneous Nystagmus: absent  Gaze-Induced Nystagmus: absent  Smooth Pursuits: intact  Saccades: intact   VESTIBULAR - OCULAR REFLEX:   Slow VOR: Normal  Head-Impulse Test: HIT Right: negative HIT Left: negative    POSITIONAL TESTING: Right Dix-Hallpike: upbeating, right nystagmus and Duration: 5 sec   VESTIBULAR TREATMENT:                                                                                                   DATE:  06/19/22 Canalith Repositioning:  Epley Right: Number of Reps: 2 and Response to Treatment: symptoms resolved   PATIENT EDUCATION: Education details: BPPV Person educated: Patient and Spouse Education method: Handouts Education comprehension: verbalized understanding  HOME EXERCISE PROGRAM:  GOALS: Goals reviewed with patient? Yes  LONG TERM GOALS: Target date: 07/17/2022  Independent with final HEP Goal status: INITIAL  2.  Demonstrate negative positional testing Goal status: INITIAL   ASSESSMENT:  CLINICAL IMPRESSION: Patient is a 67 y.o. male who was seen today for physical therapy evaluation and treatment for vertigo.  He demonstrates symptoms consistent with Rt pBPPV.  He will benefit from PT to address deficits listed.    OBJECTIVE IMPAIRMENTS: dizziness.   ACTIVITY LIMITATIONS: transfers and bed mobility  PARTICIPATION LIMITATIONS:  sleeping  PERSONAL FACTORS: 3+ comorbidities: DM, HLD, HTN  are also affecting patient's functional outcome.   REHAB POTENTIAL: Good  CLINICAL DECISION MAKING: Stable/uncomplicated  EVALUATION COMPLEXITY: Low   PLAN:  PT FREQUENCY: 1x/week  PT DURATION: 4 weeks  PLANNED INTERVENTIONS: Therapeutic exercises, Therapeutic activity, Neuromuscular re-education, Balance training, Patient/Family education, Self Care, Vestibular  training, Canalith repositioning, Manual therapy, and Re-evaluation  PLAN FOR NEXT SESSION: reassess if pt returns   Laureen Abrahams, PT, DPT 06/19/22 3:01 PM

## 2022-06-20 ENCOUNTER — Telehealth: Payer: Self-pay

## 2022-06-20 NOTE — Telephone Encounter (Signed)
PA for [facility] G7 Receiver sent to plan

## 2022-06-23 ENCOUNTER — Other Ambulatory Visit: Payer: Self-pay | Admitting: Family Medicine

## 2022-06-24 ENCOUNTER — Other Ambulatory Visit: Payer: Self-pay | Admitting: Family Medicine

## 2022-06-25 NOTE — Telephone Encounter (Signed)
Spoke w/Shelly T w/Medicare Part B [id number]  PA for [facility] G7 Receiver  has been approved effective 06/25/22 thru 04/22/23

## 2022-07-01 ENCOUNTER — Other Ambulatory Visit: Payer: Self-pay | Admitting: Family Medicine

## 2022-07-05 ENCOUNTER — Other Ambulatory Visit: Payer: Self-pay

## 2022-07-05 DIAGNOSIS — E119 Type 2 diabetes mellitus without complications: Secondary | ICD-10-CM

## 2022-07-05 MED ORDER — DEXCOM G7 RECEIVER DEVI: 1 refills | Status: AC

## 2022-07-05 NOTE — Telephone Encounter (Signed)
Patient's wife called back to follow up on PA for [place] ; stated [facility].   [facility]arm[facility]   [id number] [place] [place] - [address] [place] [address] Phone: [phone]  Fax: [phone] DEA #: [id number]    Requesting call back with update.   Please advise at [phone].

## 2022-07-08 ENCOUNTER — Encounter: Payer: Self-pay | Admitting: Physical Therapy

## 2022-07-08 ENCOUNTER — Ambulatory Visit (INDEPENDENT_AMBULATORY_CARE_PROVIDER_SITE_OTHER): Payer: Medicare HMO | Admitting: Physical Therapy

## 2022-07-08 ENCOUNTER — Other Ambulatory Visit: Payer: Self-pay

## 2022-07-08 ENCOUNTER — Other Ambulatory Visit: Payer: Self-pay | Admitting: Family Medicine

## 2022-07-08 ENCOUNTER — Telehealth: Payer: Self-pay | Admitting: Family Medicine

## 2022-07-08 DIAGNOSIS — H8111 Benign paroxysmal vertigo, right ear: Secondary | ICD-10-CM | POA: Diagnosis not present

## 2022-07-08 DIAGNOSIS — E119 Type 2 diabetes mellitus without complications: Secondary | ICD-10-CM

## 2022-07-08 DIAGNOSIS — R42 Dizziness and giddiness: Secondary | ICD-10-CM | POA: Diagnosis not present

## 2022-07-08 MED ORDER — DEXCOM G7 SENSOR MISC: 6 refills | Status: DC

## 2022-07-08 MED ORDER — DEXCOM G7 SENSOR MISC: 6 refills | Status: AC

## 2022-07-08 NOTE — Telephone Encounter (Signed)
Prescription Request  07/08/2022  LOV: 06/17/2022  What is the name of the medication or equipment?   Continuous Blood Gluc Sensor (DEXCOM G7 SENSOR) MISC  **DEXCOM RECEIVER ORDERED AND RECEIVED; NEEDS SENSOR**  Have you contacted your pharmacy to request a refill? Yes   Which pharmacy would you like this sent to?   [place] ([place] - [address], [place] [address] Phone: [phone]  Fax: [phone]    Patient notified that their request is being sent to the clinical staff for review and that they should receive a response within 2 business days.   Please advise patient when refill sent in at [phone].

## 2022-07-08 NOTE — Therapy (Signed)
OUTPATIENT PHYSICAL THERAPY VESTIBULAR EVALUATION     Patient Name: Gerald [name] MRN: [id number] DOB:12-15-1955, 67 y.o., male Today's Date: 07/08/2022  END OF SESSION:  PT End of Session - 07/08/22 1423     Visit Number 2    Number of Visits 4    Date for PT Re-Evaluation 07/17/22    Authorization Type [employer]    Progress Note Due on Visit 10    PT Start Time 1346    PT Stop Time 1420    PT Time Calculation (min) 34 min    Activity Tolerance Patient tolerated treatment well    Behavior During Therapy WFL for tasks assessed/performed              Past Medical History:  Diagnosis Date   Abnormal LFTs (liver function tests) 12/2012   Allergy    Anal condyloma    Anemia 07/2010   Diabetes mellitus    Gastric ulcer 07/2010   bleeding GU, clipped and injected with epi. non-bleeding ulcer at [place]. biopsy negative for H pylori.  on follow up EGD 08/2010, ulcers totally heled   GERD with stricture    Hyperlipemia    Unspecified essential hypertension    Upper GI bleed    Past Surgical History:  Procedure Laterality Date   COLONOSCOPY  10/2010, 04/09/2017   sessile polyp (path: tubular adenoma) in tranverse colon. Dr Deatra Ina   ESOPHAGOGASTRODUODENOSCOPY  07/24/2010   for hematemesis.  bleeding GU with VV, injected with epi and endoclipped.  second, non-bleeding ulcer at [facility]. biopsies negative for h pylori or dysplasia   ESOPHAGOGASTRODUODENOSCOPY  08/2010   to assess the gastric ulcer.  study was normal.  no residual ulcers.  Dr Deatra Ina   ESOPHAGOGASTRODUODENOSCOPY N/A 07/10/2017   Procedure: ESOPHAGOGASTRODUODENOSCOPY (EGD);  Surgeon: Jerene Bears, MD;  Location: [facility];  Service: Gastroenterology;  Laterality: N/A;   ESOPHAGOGASTRODUODENOSCOPY (EGD) WITH PROPOFOL N/A 03/08/2016   Procedure: ESOPHAGOGASTRODUODENOSCOPY (EGD) WITH PROPOFOL;  Surgeon: Jerene Bears, MD;  Location: [facility];  Service: Gastroenterology;  Laterality: N/A;   GANGLION  CYST EXCISION     POLYPECTOMY     UPPER GASTROINTESTINAL ENDOSCOPY     Patient Active Problem List   Diagnosis Date Noted   Food impaction of esophagus    Esophageal obstruction due to food impaction    Gastric ulcer with hemorrhage 09/13/2010   Special screening for malignant neoplasms, colon 09/13/2010    PCP: Susy Frizzle, MD REFERRING PROVIDER: Susy Frizzle, MD  REFERRING DIAG: H81.10 (ICD-10-CM) - Benign paroxysmal positional vertigo, unspecified laterality  THERAPY DIAG:  BPPV (benign paroxysmal positional vertigo), right  Dizziness and giddiness  ONSET DATE: ~ 6 months  Rationale for Evaluation and Treatment: Rehabilitation  SUBJECTIVE:   SUBJECTIVE STATEMENT: Symptoms returned last night; so bad he threw up once.  Pt accompanied by: significant other  PERTINENT HISTORY: DM, HLD, HTN  PAIN:  Are you having pain? No  PRECAUTIONS: None  WEIGHT BEARING RESTRICTIONS: No  FALLS: Has patient fallen in last 6 months? No  LIVING ENVIRONMENT: Lives with: lives with their spouse Lives in: House/apartment  PLOF: Independent and Leisure: works part time: HV[employer]   PATIENT GOALS: improve vertigo  OBJECTIVE:   COGNITION: Overall cognitive status: Within functional limits for tasks assessed   POSTURE:  rounded shoulders and forward head  GAIT: Comments: Independent with ambulation   VESTIBULAR ASSESSMENT:  GENERAL OBSERVATION: no symptoms, feels it's on the Rt side   SYMPTOM BEHAVIOR:  Subjective history: see above  Type of dizziness: Imbalance (Disequilibrium), Spinning/Vertigo, and Lightheadedness/Faint  Frequency: when lying down  Duration: seconds  Aggravating factors: Induced by position change: lying supine and Induced by motion: turning body quickly and turning head quickly  Relieving factors: lying supine and resolves quickly  Progression of symptoms: better  OCULOMOTOR EXAM:  Ocular Alignment: normal  Ocular ROM: No  Limitations  Spontaneous Nystagmus: absent  Gaze-Induced Nystagmus: absent  Smooth Pursuits: intact  Saccades: intact   VESTIBULAR - OCULAR REFLEX:   Slow VOR: Normal  Head-Impulse Test: HIT Right: negative HIT Left: negative    POSITIONAL TESTING: Right Dix-Hallpike: upbeating, right nystagmus and Duration: 5 sec   VESTIBULAR TREATMENT:                                                                                                   DATE:  07/08/22 VESTIBULAR ASSESSMENT:  POSITIONAL TESTING: Right Dix-Hallpike: upbeating, right nystagmus and Duration: 10 sec Left Dix-Hallpike: no nystagmus Right Roll Test: geotropic nystagmus and Duration: 12 sec Left Roll Test: geotropic nystagmus and Duration: 10 sec  Treatment:  Canalith Repositioning: Epley Right: Number of Reps: 1 and Response to Treatment: comment: deferred 2nd rep and BBQ Roll Right: Number of Reps: 1, Response to Treatment: comment: deferred 2nd rep due to nausea, and Comment: increased nausea after canalith repositioning; settled after a few min of seated rest  Instructed in habituation for home -  see HEP   06/19/22 Canalith Repositioning:  Epley Right: Number of Reps: 2 and Response to Treatment: symptoms resolved   PATIENT EDUCATION: Education details: BPPV Person educated: Patient and Spouse Education method: Handouts Education comprehension: verbalized understanding  HOME EXERCISE PROGRAM: Access Code: [place] URL: [email or web address] Date: 07/08/2022 Prepared by: Faustino Congress  Exercises - Brandt-Daroff Vestibular Exercise  - 2 x daily - 7 x weekly - 1 sets - 3-5 reps - Supine to Left Sidelying Vestibular Habituation  - 2 x daily - 7 x weekly - 1 sets - 5 reps   GOALS: Goals reviewed with patient? Yes  LONG TERM GOALS: Target date: 07/17/2022  Independent with final HEP Goal status: INITIAL  2.  Demonstrate negative positional testing Goal status:  INITIAL   ASSESSMENT:  CLINICAL IMPRESSION: Pt with new onset of recurring BPPV with multi canal involvement.  Instructed in rolling and Nestor Lewandowsky for home for self maneuvers.  Will continue to benefit from PT to maximize function.  OBJECTIVE IMPAIRMENTS: dizziness.   ACTIVITY LIMITATIONS: transfers and bed mobility  PARTICIPATION LIMITATIONS:  sleeping  PERSONAL FACTORS: 3+ comorbidities: DM, HLD, HTN  are also affecting patient's functional outcome.   REHAB POTENTIAL: Good  CLINICAL DECISION MAKING: Stable/uncomplicated  EVALUATION COMPLEXITY: Low   PLAN:  PT FREQUENCY: 1x/week  PT DURATION: 4 weeks  PLANNED INTERVENTIONS: Therapeutic exercises, Therapeutic activity, Neuromuscular re-education, Balance training, Patient/Family education, Self Care, Vestibular training, Canalith repositioning, Manual therapy, and Re-evaluation  PLAN FOR NEXT SESSION: reassess and tx as indicated   Laureen Abrahams, PT, DPT 07/08/22 2:27 PM

## 2022-07-15 ENCOUNTER — Encounter: Payer: Self-pay | Admitting: Physical Therapy

## 2022-07-15 ENCOUNTER — Ambulatory Visit (INDEPENDENT_AMBULATORY_CARE_PROVIDER_SITE_OTHER): Payer: Medicare HMO | Admitting: Physical Therapy

## 2022-07-15 DIAGNOSIS — H8111 Benign paroxysmal vertigo, right ear: Secondary | ICD-10-CM

## 2022-07-15 DIAGNOSIS — R42 Dizziness and giddiness: Secondary | ICD-10-CM

## 2022-07-15 NOTE — Therapy (Signed)
OUTPATIENT PHYSICAL THERAPY TREATMENT     Patient Name: Gerald [name] MRN: [id number] DOB:[birth year], 67 y.o., male Today's Date: 07/15/2022  END OF SESSION:  PT End of Session - 07/15/22 1448     Visit Number 3    Number of Visits 4    Date for PT Re-Evaluation 07/17/22    Authorization Type [employer]na Medicare    Progress Note Due on Visit 10    PT Start Time 1430    PT Stop Time 1504    PT Time Calculation (min) 34 min    Activity Tolerance Patient tolerated treatment well    Behavior During Therapy [facility] for tasks assessed/performed               Past Medical History:  Diagnosis Date   Abnormal LFTs (liver function tests) 12/2012   Allergy    Anal condyloma    Anemia 07/2010   Diabetes mellitus    Gastric ulcer 07/2010   bleeding GU, clipped and injected with epi. non-bleeding ulcer at [place]. biopsy negative for H pylori.  on follow up EGD 08/2010, ulcers totally heled   GERD with stricture    Hyperlipemia    Unspecified essential hypertension    Upper GI bleed    Past Surgical History:  Procedure Laterality Date   COLONOSCOPY  10/2010, 04/09/2017   sessile polyp (path: tubular adenoma) in tranverse colon. Dr Deatra Ina   ESOPHAGOGASTRODUODENOSCOPY  07/24/2010   for hematemesis.  bleeding GU with VV, injected with epi and endoclipped.  second, non-bleeding ulcer at [facility]. biopsies negative for h pylori or dysplasia   ESOPHAGOGASTRODUODENOSCOPY  08/2010   to assess the gastric ulcer.  study was normal.  no residual ulcers.  Dr Deatra Ina   ESOPHAGOGASTRODUODENOSCOPY N/A 07/10/2017   Procedure: ESOPHAGOGASTRODUODENOSCOPY (EGD);  Surgeon: Jerene Bears, MD;  Location: [facility];  Service: Gastroenterology;  Laterality: N/A;   ESOPHAGOGASTRODUODENOSCOPY (EGD) WITH PROPOFOL N/A 03/08/2016   Procedure: ESOPHAGOGASTRODUODENOSCOPY (EGD) WITH PROPOFOL;  Surgeon: Jerene Bears, MD;  Location: [facility];  Service: Gastroenterology;  Laterality: N/A;   GANGLION CYST EXCISION      POLYPECTOMY     UPPER GASTROINTESTINAL ENDOSCOPY     Patient Active Problem List   Diagnosis Date Noted   Food impaction of esophagus    Esophageal obstruction due to food impaction    Gastric ulcer with hemorrhage 09/13/2010   Special screening for malignant neoplasms, colon 09/13/2010    PCP: Susy Frizzle, MD REFERRING PROVIDER: Susy Frizzle, MD  REFERRING DIAG: H81.10 (ICD-10-CM) - Benign paroxysmal positional vertigo, unspecified laterality  THERAPY DIAG:  BPPV (benign paroxysmal positional vertigo), right  Dizziness and giddiness  ONSET DATE: ~ 6 months  Rationale for Evaluation and Treatment: Rehabilitation  SUBJECTIVE:   SUBJECTIVE STATEMENT: Symptoms improved; still a little dizzy.  Pt accompanied by: significant other  PERTINENT HISTORY: DM, HLD, HTN  PAIN:  Are you having pain? No  PRECAUTIONS: None  WEIGHT BEARING RESTRICTIONS: No  FALLS: Has patient fallen in last 6 months? No  LIVING ENVIRONMENT: Lives with: lives with their spouse Lives in: House/apartment  PLOF: Independent and Leisure: works part time: HVAC   PATIENT GOALS: improve vertigo  OBJECTIVE:   COGNITION: Overall cognitive status: Within functional limits for tasks assessed   POSTURE:  rounded shoulders and forward head  GAIT: Comments: Independent with ambulation   VESTIBULAR ASSESSMENT:  GENERAL OBSERVATION: no symptoms, feels it's on the Rt side   SYMPTOM BEHAVIOR:  Subjective history: see  above  Type of dizziness: Imbalance (Disequilibrium), Spinning/Vertigo, and Lightheadedness/Faint  Frequency: when lying down  Duration: seconds  Aggravating factors: Induced by position change: lying supine and Induced by motion: turning body quickly and turning head quickly  Relieving factors: lying supine and resolves quickly  Progression of symptoms: better  OCULOMOTOR EXAM:  Ocular Alignment: normal  Ocular ROM: No Limitations  Spontaneous Nystagmus:  absent  Gaze-Induced Nystagmus: absent  Smooth Pursuits: intact  Saccades: intact   VESTIBULAR - OCULAR REFLEX:   Slow VOR: Normal  Head-Impulse Test: HIT Right: negative HIT Left: negative    POSITIONAL TESTING: Right Dix-Hallpike: upbeating, right nystagmus and Duration: 5 sec   VESTIBULAR TREATMENT:                                                                                                   DATE:  07/15/22 VESTIBULAR ASSESSMENT:  POSITIONAL TESTING: Right Dix-Hallpike: right horizontal nystagmus and Duration: 5 sec Left Dix-Hallpike: no nystagmus Right Roll Test: apogeotropic nystagmus and Duration: 5 sec Left Roll Test: no nystagmus  Treatment:  Canalith Repositioning: Semont Right Posterior: Number of Reps: 1 and Response to Treatment: comment: modified Semont for horizontal cupulo, Horizontal Cupulolith Repositioning Maneuver: Number of Reps: 1 and Response to Treatment: symptoms improved, and Comment: retesting negative   07/08/22 VESTIBULAR ASSESSMENT:  POSITIONAL TESTING: Right Dix-Hallpike: upbeating, right nystagmus and Duration: 10 sec Left Dix-Hallpike: no nystagmus Right Roll Test: geotropic nystagmus and Duration: 12 sec Left Roll Test: geotropic nystagmus and Duration: 10 sec  Canalith Repositioning: Epley Right: Number of Reps: 1 and Response to Treatment: comment: deferred 2nd rep and BBQ Roll Right: Number of Reps: 1, Response to Treatment: comment: deferred 2nd rep due to nausea, and Comment: increased nausea after canalith repositioning; settled after a few min of seated rest  Instructed in habituation for home -  see HEP   06/19/22 Canalith Repositioning:  Epley Right: Number of Reps: 2 and Response to Treatment: symptoms resolved   PATIENT EDUCATION: Education details: BPPV Person educated: Patient and Spouse Education method: Handouts Education comprehension: verbalized understanding  HOME EXERCISE PROGRAM: Access Code: [place] URL:  [email or web address] Date: 07/08/2022 Prepared by: Faustino Congress  Exercises - Brandt-Daroff Vestibular Exercise  - 2 x daily - 7 x weekly - 1 sets - 3-5 reps - Supine to Left Sidelying Vestibular Habituation  - 2 x daily - 7 x weekly - 1 sets - 5 reps   GOALS: Goals reviewed with patient? Yes  LONG TERM GOALS: Target date: 07/17/2022  Independent with final HEP Goal status: INITIAL  2.  Demonstrate negative positional testing Goal status: INITIAL   ASSESSMENT:  CLINICAL IMPRESSION: Retesting horizontal roll demonstrated negative nystagmus or symptoms.  Anticipate symptoms are resolved but will see if needed.  OBJECTIVE IMPAIRMENTS: dizziness.   ACTIVITY LIMITATIONS: transfers and bed mobility  PARTICIPATION LIMITATIONS:  sleeping  PERSONAL FACTORS: 3+ comorbidities: DM, HLD, HTN  are also affecting patient's functional outcome.   REHAB POTENTIAL: Good  CLINICAL DECISION MAKING: Stable/uncomplicated  EVALUATION COMPLEXITY: Low   PLAN:  PT FREQUENCY: 1x/week  PT DURATION: 4 weeks  PLANNED INTERVENTIONS: Therapeutic exercises, Therapeutic activity, Neuromuscular re-education, Balance training, Patient/Family education, Self Care, Vestibular training, Canalith repositioning, Manual therapy, and Re-evaluation  PLAN FOR NEXT SESSION: will need recert if he returns, reassess and tx as indicated   Laureen Abrahams, PT, DPT 07/15/22 3:09 PM

## 2022-07-18 IMAGING — CR DG CHEST 2V
2 series · 2 of 2 positions shown · non-contrast
Comparison: None.

CLINICAL DATA: Cough and fever

EXAM:
CHEST - 2 VIEW

[w chest pa]
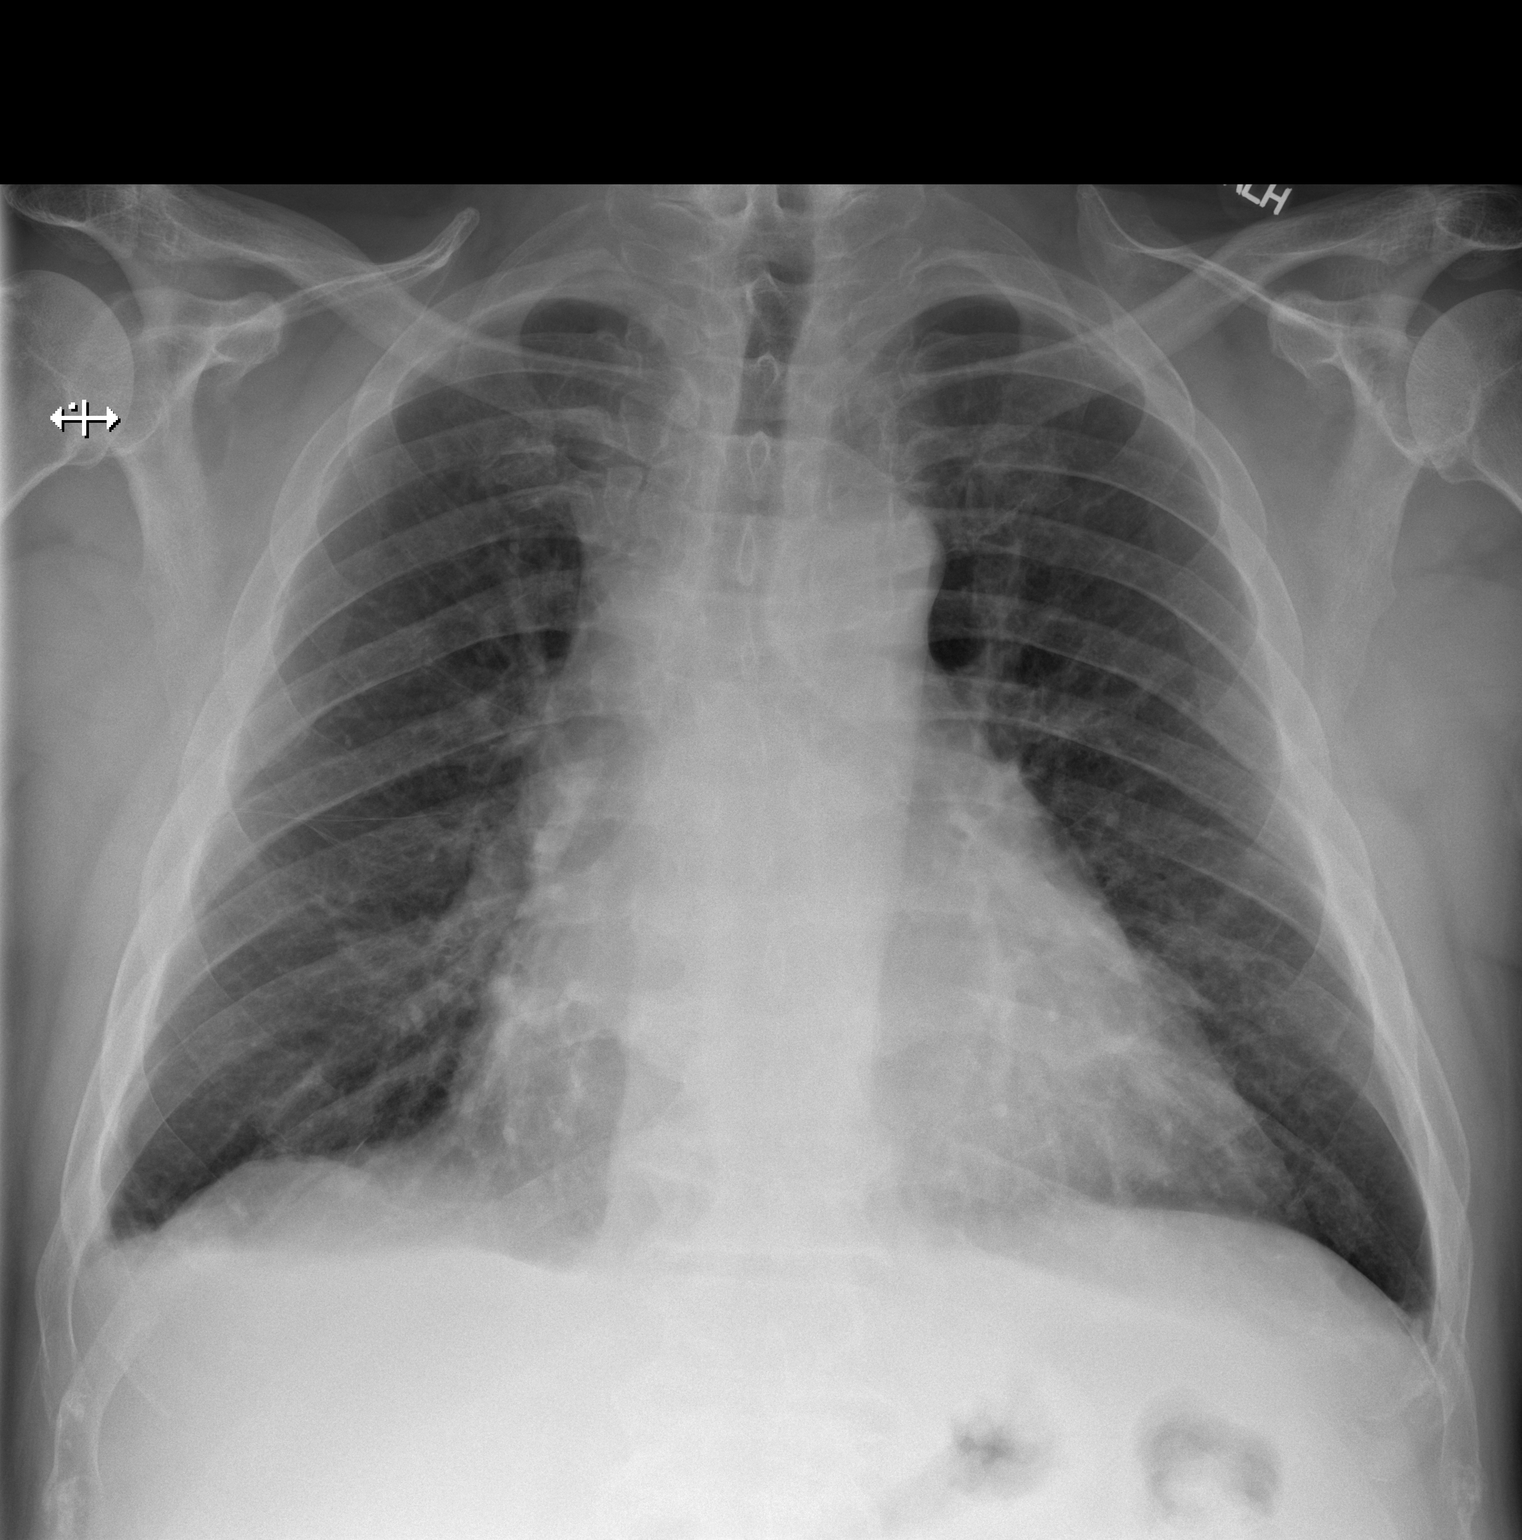

[w chest lat]
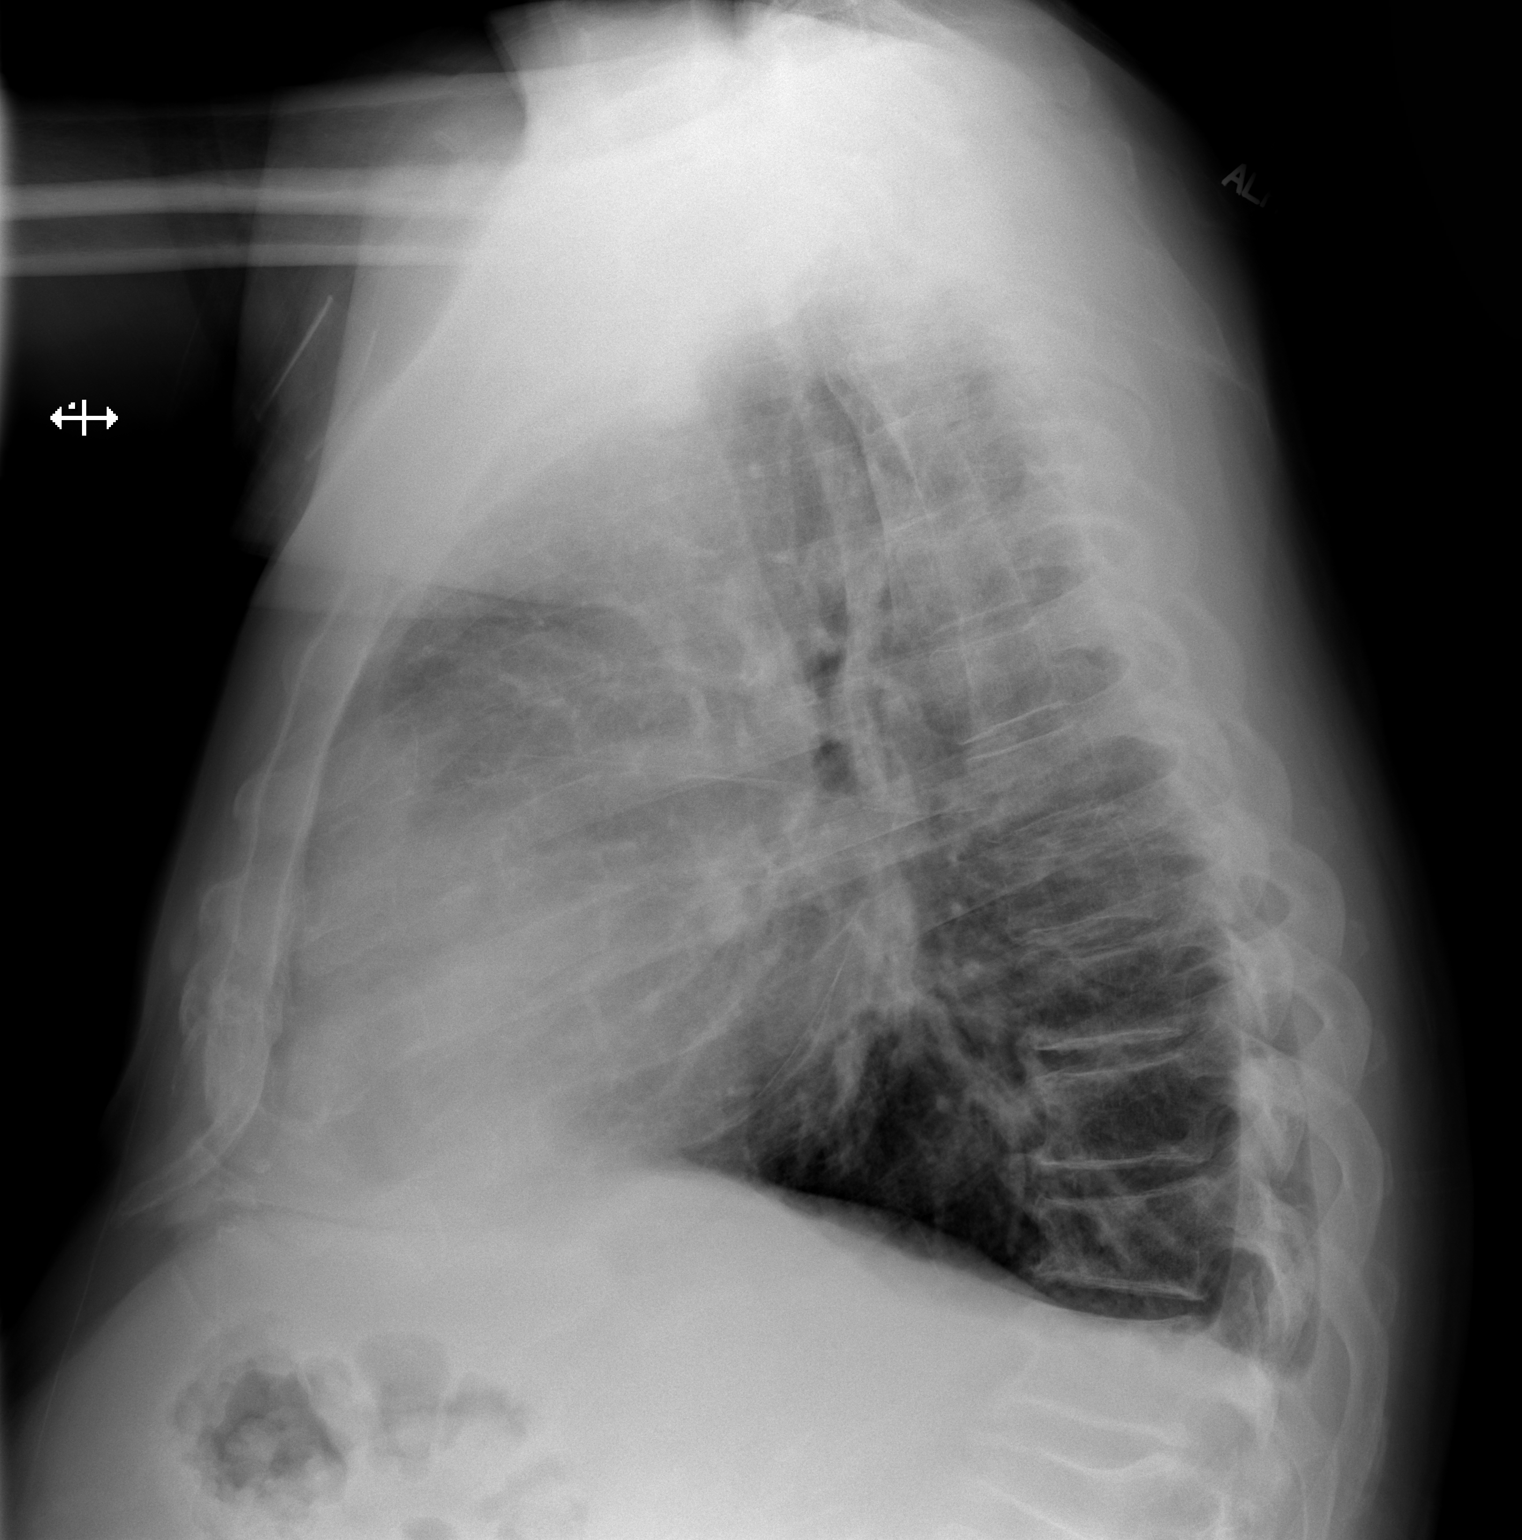

[2 of 2 positions shown; findings below may reference images not displayed]

FINDINGS: Cardiomegaly. No focal consolidation. Trace bilateral pleural
effusions and bibasilar atelectasis. No focal consolidation. No
evidence of pneumothorax.
IMPRESSION: Cardiomegaly and trace bilateral pleural effusions. No focal
opacity.

## 2022-07-25 ENCOUNTER — Ambulatory Visit (INDEPENDENT_AMBULATORY_CARE_PROVIDER_SITE_OTHER): Payer: Medicare HMO | Admitting: Physical Therapy

## 2022-07-25 ENCOUNTER — Encounter: Payer: Self-pay | Admitting: Physical Therapy

## 2022-07-25 DIAGNOSIS — H8111 Benign paroxysmal vertigo, right ear: Secondary | ICD-10-CM | POA: Diagnosis not present

## 2022-07-25 DIAGNOSIS — R42 Dizziness and giddiness: Secondary | ICD-10-CM

## 2022-07-25 NOTE — Therapy (Signed)
OUTPATIENT PHYSICAL THERAPY TREATMENT RECERTIFICATION     Patient Name: Gerald [name] MRN: [id number] DOB:Oct 07, 1955, 67 y.o., male Today's Date: 07/25/2022  END OF SESSION:  PT End of Session - 07/25/22 1343     Visit Number 4    Number of Visits 8    Date for PT Re-Evaluation 08/22/22    Authorization Type [employer]na Medicare    Progress Note Due on Visit 10    PT Start Time 1342    PT Stop Time 1414    PT Time Calculation (min) 32 min    Activity Tolerance Patient tolerated treatment well    Behavior During Therapy WFL for tasks assessed/performed                Past Medical History:  Diagnosis Date   Abnormal LFTs (liver function tests) 12/2012   Allergy    Anal condyloma    Anemia 07/2010   Diabetes mellitus    Gastric ulcer 07/2010   bleeding GU, clipped and injected with epi. non-bleeding ulcer at [place]. biopsy negative for H pylori.  on follow up EGD 08/2010, ulcers totally heled   GERD with stricture    Hyperlipemia    Unspecified essential hypertension    Upper GI bleed    Past Surgical History:  Procedure Laterality Date   COLONOSCOPY  10/2010, 04/09/2017   sessile polyp (path: tubular adenoma) in tranverse colon. Dr Deatra Ina   ESOPHAGOGASTRODUODENOSCOPY  07/24/2010   for hematemesis.  bleeding GU with VV, injected with epi and endoclipped.  second, non-bleeding ulcer at [facility]. biopsies negative for h pylori or dysplasia   ESOPHAGOGASTRODUODENOSCOPY  08/2010   to assess the gastric ulcer.  study was normal.  no residual ulcers.  Dr Deatra Ina   ESOPHAGOGASTRODUODENOSCOPY N/A 07/10/2017   Procedure: ESOPHAGOGASTRODUODENOSCOPY (EGD);  Surgeon: Jerene Bears, MD;  Location: [facility];  Service: Gastroenterology;  Laterality: N/A;   ESOPHAGOGASTRODUODENOSCOPY (EGD) WITH PROPOFOL N/A 03/08/2016   Procedure: ESOPHAGOGASTRODUODENOSCOPY (EGD) WITH PROPOFOL;  Surgeon: Jerene Bears, MD;  Location: [facility];  Service: Gastroenterology;  Laterality: N/A;    GANGLION CYST EXCISION     POLYPECTOMY     UPPER GASTROINTESTINAL ENDOSCOPY     Patient Active Problem List   Diagnosis Date Noted   Food impaction of esophagus    Esophageal obstruction due to food impaction    Gastric ulcer with hemorrhage 09/13/2010   Special screening for malignant neoplasms, colon 09/13/2010    PCP: Susy Frizzle, MD REFERRING PROVIDER: Susy Frizzle, MD  REFERRING DIAG: H81.10 (ICD-10-CM) - Benign paroxysmal positional vertigo, unspecified laterality  THERAPY DIAG:  BPPV (benign paroxysmal positional vertigo), right - Plan: PT plan of care cert/re-cert  Dizziness and giddiness - Plan: PT plan of care cert/re-cert  ONSET DATE: ~ 6 months  Rationale for Evaluation and Treatment: Rehabilitation  SUBJECTIVE:   SUBJECTIVE STATEMENT: Still dizzy with lying down, but having some symptoms with standing and quick turns (described as imbalance)  Pt accompanied by: significant other  PERTINENT HISTORY: DM, HLD, HTN  PAIN:  Are you having pain? No  PRECAUTIONS: None  WEIGHT BEARING RESTRICTIONS: No  FALLS: Has patient fallen in last 6 months? No  LIVING ENVIRONMENT: Lives with: lives with their spouse Lives in: House/apartment  PLOF: Independent and Leisure: works part time: HVAC   PATIENT GOALS: improve vertigo  OBJECTIVE:   COGNITION: Overall cognitive status: Within functional limits for tasks assessed   POSTURE:  rounded shoulders and forward head  GAIT:  Comments: Independent with ambulation   VESTIBULAR ASSESSMENT:  GENERAL OBSERVATION: no symptoms, feels it's on the Rt side   SYMPTOM BEHAVIOR:  Subjective history: see above  Type of dizziness: Imbalance (Disequilibrium), Spinning/Vertigo, and Lightheadedness/Faint  Frequency: when lying down  Duration: seconds  Aggravating factors: Induced by position change: lying supine and Induced by motion: turning body quickly and turning head quickly  Relieving factors: lying  supine and resolves quickly  Progression of symptoms: better  OCULOMOTOR EXAM:  Ocular Alignment: normal  Ocular ROM: No Limitations  Spontaneous Nystagmus: absent  Gaze-Induced Nystagmus: absent  Smooth Pursuits: intact  Saccades: intact   VESTIBULAR - OCULAR REFLEX:   Slow VOR: Normal  Head-Impulse Test: HIT Right: negative HIT Left: negative    POSITIONAL TESTING: Right Dix-Hallpike: upbeating, right nystagmus and Duration: 5 sec   VESTIBULAR TREATMENT:                                                                                                   DATE:  07/25/22 POSITIONAL TESTING: Right Dix-Hallpike: left horizontal nystagmus  and Duration: 5 sec; ageotropic nystagmus  Treatment:  Canalith Repositioning: Epley Right: Number of Reps: 1 and Comment: used vibration 1st position and Horizontal Cupulolith Repositioning Maneuver: Number of Reps: 1, Response to Treatment: comment: no significant change, and Comment: Kim maneuver  Gaze x 1 in sitting, standing and standing on foam x 30 sec each; amb with horizontal head turns added to HEP  07/15/22 VESTIBULAR ASSESSMENT:  POSITIONAL TESTING: Right Dix-Hallpike: right horizontal nystagmus and Duration: 5 sec Left Dix-Hallpike: no nystagmus Right Roll Test: apogeotropic nystagmus and Duration: 5 sec Left Roll Test: no nystagmus  Treatment:  Canalith Repositioning: Semont Right Posterior: Number of Reps: 1 and Response to Treatment: comment: modified Semont for horizontal cupulo, Horizontal Cupulolith Repositioning Maneuver: Number of Reps: 1 and Response to Treatment: symptoms improved, and Comment: retesting negative   07/08/22 VESTIBULAR ASSESSMENT:  POSITIONAL TESTING: Right Dix-Hallpike: upbeating, right nystagmus and Duration: 10 sec Left Dix-Hallpike: no nystagmus Right Roll Test: geotropic nystagmus and Duration: 12 sec Left Roll Test: geotropic nystagmus and Duration: 10 sec  Canalith Repositioning: Epley Right:  Number of Reps: 1 and Response to Treatment: comment: deferred 2nd rep and BBQ Roll Right: Number of Reps: 1, Response to Treatment: comment: deferred 2nd rep due to nausea, and Comment: increased nausea after canalith repositioning; settled after a few min of seated rest  Instructed in habituation for home -  see HEP   06/19/22 Canalith Repositioning:  Epley Right: Number of Reps: 2 and Response to Treatment: symptoms resolved   PATIENT EDUCATION: Education details: BPPV Person educated: Patient and Spouse Education method: Handouts Education comprehension: verbalized understanding  HOME EXERCISE PROGRAM: Access Code: [place] URL: [email or web address] Date: 07/08/2022 Prepared by: Faustino Congress  Exercises - Brandt-Daroff Vestibular Exercise  - 2 x daily - 7 x weekly - 1 sets - 3-5 reps - Supine to Left Sidelying Vestibular Habituation  - 2 x daily - 7 x weekly - 1 sets - 5 reps   GOALS: Goals reviewed with patient? Yes  LONG TERM GOALS:  Target date: 08/22/2022  Independent with final HEP Goal status: Ongoing 07/25/22  2.  Demonstrate negative positional testing Goal status: Ongoing 07/25/22   ASSESSMENT:  CLINICAL IMPRESSION: Continued with mild dizziness and positive positional testing.  Will continue to see up to 1x/wk PRN to address symptoms.    OBJECTIVE IMPAIRMENTS: dizziness.   ACTIVITY LIMITATIONS: transfers and bed mobility  PARTICIPATION LIMITATIONS:  sleeping  PERSONAL FACTORS: 3+ comorbidities: DM, HLD, HTN  are also affecting patient's functional outcome.   REHAB POTENTIAL: Good  CLINICAL DECISION MAKING: Stable/uncomplicated  EVALUATION COMPLEXITY: Low   PLAN:  PT FREQUENCY: 1x/week  PT DURATION: 4 weeks  PLANNED INTERVENTIONS: Therapeutic exercises, Therapeutic activity, Neuromuscular re-education, Balance training, Patient/Family education, Self Care, Vestibular training, Canalith repositioning, Manual therapy, and  Re-evaluation  PLAN FOR NEXT SESSION: reassess canals, review HEP PRN   Laureen Abrahams, PT, DPT 07/25/22 3:01 PM

## 2022-08-14 ENCOUNTER — Encounter: Payer: Self-pay | Admitting: Physical Therapy

## 2022-08-14 ENCOUNTER — Ambulatory Visit (INDEPENDENT_AMBULATORY_CARE_PROVIDER_SITE_OTHER): Payer: Medicare HMO | Admitting: Physical Therapy

## 2022-08-14 DIAGNOSIS — H8111 Benign paroxysmal vertigo, right ear: Secondary | ICD-10-CM | POA: Diagnosis not present

## 2022-08-14 DIAGNOSIS — R42 Dizziness and giddiness: Secondary | ICD-10-CM

## 2022-08-14 NOTE — Therapy (Addendum)
OUTPATIENT PHYSICAL THERAPY TREATMENT DISCHARGE SUMMARY     Patient Name: Gerald [name] MRN: [id number] DOB:30-Jan-1956, 67 y.o., male Today's Date: 08/14/2022  END OF SESSION:  PT End of Session - 08/14/22 1346     Visit Number 5    Number of Visits 8    Date for PT Re-Evaluation 08/22/22    Authorization Type [employer]    Progress Note Due on Visit 10    PT Start Time 1345    PT Stop Time 1413    PT Time Calculation (min) 28 min    Activity Tolerance Patient tolerated treatment well    Behavior During Therapy WFL for tasks assessed/performed                 Past Medical History:  Diagnosis Date   Abnormal LFTs (liver function tests) 12/2012   Allergy    Anal condyloma    Anemia 07/2010   Diabetes mellitus    Gastric ulcer 07/2010   bleeding GU, clipped and injected with epi. non-bleeding ulcer at [facility]. biopsy negative for H pylori.  on follow up EGD 08/2010, ulcers totally heled   GERD with stricture    Hyperlipemia    Unspecified essential hypertension    Upper GI bleed    Past Surgical History:  Procedure Laterality Date   COLONOSCOPY  10/2010, 04/09/2017   sessile polyp (path: tubular adenoma) in tranverse colon. Dr Arlyce Dice   ESOPHAGOGASTRODUODENOSCOPY  07/24/2010   for hematemesis.  bleeding GU with VV, injected with epi and endoclipped.  second, non-bleeding ulcer at [employer]. biopsies negative for h pylori or dysplasia   ESOPHAGOGASTRODUODENOSCOPY  08/2010   to assess the gastric ulcer.  study was normal.  no residual ulcers.  Dr Arlyce Dice   ESOPHAGOGASTRODUODENOSCOPY N/A 07/10/2017   Procedure: ESOPHAGOGASTRODUODENOSCOPY (EGD);  Surgeon: Beverley Fiedler, MD;  Location: [facility];  Service: Gastroenterology;  Laterality: N/A;   ESOPHAGOGASTRODUODENOSCOPY (EGD) WITH PROPOFOL N/A 03/08/2016   Procedure: ESOPHAGOGASTRODUODENOSCOPY (EGD) WITH PROPOFOL;  Surgeon: Beverley Fiedler, MD;  Location: [facility];  Service: Gastroenterology;  Laterality: N/A;    GANGLION CYST EXCISION     POLYPECTOMY     UPPER GASTROINTESTINAL ENDOSCOPY     Patient Active Problem List   Diagnosis Date Noted   Food impaction of esophagus    Esophageal obstruction due to food impaction    Gastric ulcer with hemorrhage 09/13/2010   Special screening for malignant neoplasms, colon 09/13/2010    PCP: Donita Brooks, MD REFERRING PROVIDER: Donita Brooks, MD  REFERRING DIAG: H81.10 (ICD-10-CM) - Benign paroxysmal positional vertigo, unspecified laterality  THERAPY DIAG:  BPPV (benign paroxysmal positional vertigo), right  Dizziness and giddiness  ONSET DATE: ~ 6 months  Rationale for Evaluation and Treatment: Rehabilitation  SUBJECTIVE:   SUBJECTIVE STATEMENT: Symptoms are much better; [place]  Pt accompanied by: significant other  PERTINENT HISTORY: DM, HLD, HTN  PAIN:  Are you having pain? No  PRECAUTIONS: None  WEIGHT BEARING RESTRICTIONS: No  FALLS: Has patient fallen in last 6 months? No  LIVING ENVIRONMENT: Lives with: lives with their spouse Lives in: House/apartment  PLOF: Independent and Leisure: works part time: HVAC   PATIENT GOALS: improve vertigo  OBJECTIVE:   COGNITION: Overall cognitive status: Within functional limits for tasks assessed   POSTURE:  rounded shoulders and forward head  GAIT: Comments: Independent with ambulation   VESTIBULAR ASSESSMENT:  GENERAL OBSERVATION: no symptoms, feels it's on  the Rt side   SYMPTOM BEHAVIOR:  Subjective history: see above  Type of dizziness: Imbalance (Disequilibrium), Spinning/Vertigo, and Lightheadedness/Faint  Frequency: when lying down  Duration: seconds  Aggravating factors: Induced by position change: lying supine and Induced by motion: turning body quickly and turning head quickly  Relieving factors: lying supine and resolves quickly  Progression of symptoms: better  OCULOMOTOR EXAM:  Ocular Alignment:  normal  Ocular ROM: No Limitations  Spontaneous Nystagmus: absent  Gaze-Induced Nystagmus: absent  Smooth Pursuits: intact  Saccades: intact   VESTIBULAR - OCULAR REFLEX:   Slow VOR: Normal  Head-Impulse Test: HIT Right: negative HIT Left: negative    POSITIONAL TESTING: Right Dix-Hallpike: upbeating, right nystagmus and Duration: 5 sec   VESTIBULAR TREATMENT:                                                                                                   DATE:  08/14/22 Neuro Re-Ed POSITIONAL TESTING: Right Dix-Hallpike: no nystagmus and mild symptoms with returning to sit without nystagmus Left Dix-Hallpike: no nystagmus Right Roll Test: no nystagmus Left Roll Test: no nystagmus  CORNER BALANCE: LOB with EC and feet together on compliant surface; with improved balance with repetition; EC with feet apart on compliant surface with horizontal/vertical head turns; unable to complete head turns with EC and feet together  07/25/22 POSITIONAL TESTING: Right Dix-Hallpike: left horizontal nystagmus  and Duration: 5 sec; ageotropic nystagmus  Treatment:  Canalith Repositioning: Epley Right: Number of Reps: 1 and Comment: used vibration 1st position and Horizontal Cupulolith Repositioning Maneuver: Number of Reps: 1, Response to Treatment: comment: no significant change, and Comment: Kim maneuver  Gaze x 1 in sitting, standing and standing on foam x 30 sec each; amb with horizontal head turns added to HEP  07/15/22 VESTIBULAR ASSESSMENT:  POSITIONAL TESTING: Right Dix-Hallpike: right horizontal nystagmus and Duration: 5 sec Left Dix-Hallpike: no nystagmus Right Roll Test: apogeotropic nystagmus and Duration: 5 sec Left Roll Test: no nystagmus  Treatment:  Canalith Repositioning: Semont Right Posterior: Number of Reps: 1 and Response to Treatment: comment: modified Semont for horizontal cupulo, Horizontal Cupulolith Repositioning Maneuver: Number of Reps: 1 and Response to Treatment:  symptoms improved, and Comment: retesting negative   07/08/22 VESTIBULAR ASSESSMENT:  POSITIONAL TESTING: Right Dix-Hallpike: upbeating, right nystagmus and Duration: 10 sec Left Dix-Hallpike: no nystagmus Right Roll Test: geotropic nystagmus and Duration: 12 sec Left Roll Test: geotropic nystagmus and Duration: 10 sec  Canalith Repositioning: Epley Right: Number of Reps: 1 and Response to Treatment: comment: deferred 2nd rep and BBQ Roll Right: Number of Reps: 1, Response to Treatment: comment: deferred 2nd rep due to nausea, and Comment: increased nausea after canalith repositioning; settled after a few min of seated rest  Instructed in habituation for home -  see HEP   06/19/22 Canalith Repositioning:  Epley Right: Number of Reps: 2 and Response to Treatment: symptoms resolved   PATIENT EDUCATION: Education details: BPPV Person educated: Patient and Spouse Education method: Handouts Education comprehension: verbalized understanding  HOME EXERCISE PROGRAM: Access Code: [email or web address] URL: [email or web address] Date: 08/14/2022 Prepared by: Moshe Cipro  Exercises - Brandt-Daroff Vestibular Exercise  - 2 x daily - 7 x weekly - 1 sets - 3-5 reps - Supine to Left Sidelying Vestibular Habituation  - 2 x daily - 7 x weekly - 1 sets - 5 reps - Seated Gaze Stabilization with Head Rotation  - 2 x daily - 7 x weekly - 1 sets - 30 seconds - Walking with Head Rotation  - 1 x daily - 7 x weekly - 1 sets - 3 reps - 10-20 feet - Corner Balance Feet Together With Eyes Closed  - 1 x daily - 7 x weekly - 3 sets - 10 reps - Corner Balance Feet Apart: Eyes Closed With Head Turns  - 1 x daily - 7 x weekly - 3 sets - 10 reps   GOALS: Goals reviewed with patient? Yes  LONG TERM GOALS: Target date: 08/22/2022  Independent with final HEP Goal status: Ongoing 07/25/22  2.  Demonstrate negative positional testing Goal status: Ongoing 07/25/22   ASSESSMENT:  CLINICAL  IMPRESSION: Pt with negative positional testing today but feel now symptoms due to vestibular hypofunction.  Added to HEP to address deficits.  Plan to hold PT and pt will call if needed.  OBJECTIVE IMPAIRMENTS: dizziness.   ACTIVITY LIMITATIONS: transfers and bed mobility  PARTICIPATION LIMITATIONS:  sleeping  PERSONAL FACTORS: 3+ comorbidities: DM, HLD, HTN  are also affecting patient's functional outcome.   REHAB POTENTIAL: Good  CLINICAL DECISION MAKING: Stable/uncomplicated  EVALUATION COMPLEXITY: Low   PLAN:  PT FREQUENCY: 1x/week  PT DURATION: 4 weeks  PLANNED INTERVENTIONS: Therapeutic exercises, Therapeutic activity, Neuromuscular re-education, Balance training, Patient/Family education, Self Care, Vestibular training, Canalith repositioning, Manual therapy, and Re-evaluation  PLAN FOR NEXT SESSION: hold PT, reassess PRN   Clarita Crane, PT, DPT 08/14/22 2:21 PM  PHYSICAL THERAPY DISCHARGE SUMMARY  Visits from Start of Care: 5  Current functional level related to goals / functional outcomes: See above   Remaining deficits: See above   Education / Equipment: HEP   Patient agrees to discharge. Patient goals were partially met. Patient is being discharged due to being pleased with the current functional level.  Clarita Crane, PT, DPT 10/28/22 3:36 PM  [facility] [address] [place], [place], [address] Phone: [phone]   Fax:  [phone]

## 2022-08-30 ENCOUNTER — Telehealth: Payer: Self-pay

## 2022-08-30 ENCOUNTER — Other Ambulatory Visit: Payer: Self-pay | Admitting: Family Medicine

## 2022-08-30 MED ORDER — METFORMIN HCL 1000 MG PO TABS: 1000.0000 mg | ORAL_TABLET | Freq: Two times a day (BID) | ORAL | 0 refills | Status: DC

## 2022-08-30 NOTE — Telephone Encounter (Signed)
Prescription Request  08/30/2022  LOV: 06/17/22  What is the name of the medication or equipment? hydrochlorothiazide (HYDRODIURIL) 25 MG tablet [[id number]]  Have you contacted your pharmacy to request a refill? Yes   Which pharmacy would you like this sent to?  [id number] [place], [place] - [address] [place] [address] Phone: [phone] Fax: [phone]    Patient notified that their request is being sent to the clinical staff for review and that they should receive a response within 2 business days.   Please advise at [facility] [phone]

## 2022-08-30 NOTE — Telephone Encounter (Signed)
Prescription Request  08/30/2022  LOV: 06/17/22  What is the name of the medication or equipment? metFORMIN (GLUCOPHAGE) 1000 MG tablet [[id number]]  Have you contacted your pharmacy to request a refill? Yes   Which pharmacy would you like this sent to?  [id number] [place], [place] - [address] [place] [address] Phone: [phone] Fax: [phone]    Patient notified that their request is being sent to the clinical staff for review and that they should receive a response within 2 business days.   Please advise at [facility] [phone]

## 2022-08-30 NOTE — Addendum Note (Signed)
Addended by: Arta Silence on: 08/30/2022 08:33 AM   Modules accepted: Orders

## 2022-08-30 NOTE — Telephone Encounter (Signed)
OV 06/17/22 Requested Prescriptions  Pending Prescriptions Disp Refills   glipiZIDE (GLUCOTROL XL) 10 MG 24 hr tablet [Pharmacy Med Name: GLIPIZIDE ER 10 MG TABLET] 90 tablet 1    Sig: TAKE 1 TABLET BY MOUTH EVERY DAY     Endocrinology:  Diabetes - Sulfonylureas Failed - 08/30/2022  2:31 AM      Failed - HBA1C is between 0 and 7.9 and within 180 days    Hgb A1c MFr Bld  Date Value Ref Range Status  04/08/2022 8.1 (H) <5.7 % of total Hgb Final    Comment:    For someone without known diabetes, a hemoglobin A1c value of 6.5% or greater indicates that they may have  diabetes and this should be confirmed with a follow-up  test. . For someone with known diabetes, a value <7% indicates  that their diabetes is well controlled and a value  greater than or equal to 7% indicates suboptimal  control. A1c targets should be individualized based on  duration of diabetes, age, comorbid conditions, and  other considerations. . Currently, no consensus exists regarding use of hemoglobin A1c for diagnosis of diabetes for children. .          Failed - Valid encounter within last 6 months    Recent Outpatient Visits           11 months ago Subacromial bursitis of left shoulder joint   [facility] Pickard, Priscille Heidelberg, MD   12 months ago Vertigo   [facility] Tanya Nones, Priscille Heidelberg, MD   1 year ago Acute cough   [facility] Cathlean Marseilles A, NP   1 year ago Controlled type 2 diabetes mellitus without complication, without long-term current use of insulin (HCC)   [facility] Medicine Donita Brooks, MD   2 years ago Controlled type 2 diabetes mellitus without complication, without long-term current use of insulin (HCC)   [facility] Pickard, Priscille Heidelberg, MD              Passed - Cr in normal range and within 360 days    Creat  Date Value Ref Range Status  04/08/2022 0.83 0.70 - 1.35 mg/dL Final   Creatinine, Urine   Date Value Ref Range Status  04/08/2022 88 20 - 320 mg/dL Final

## 2022-09-04 ENCOUNTER — Other Ambulatory Visit: Payer: Self-pay

## 2022-09-04 MED ORDER — HYDROCHLOROTHIAZIDE 25 MG PO TABS: 25.0000 mg | ORAL_TABLET | Freq: Every day | ORAL | 0 refills | Status: DC

## 2022-09-04 NOTE — Telephone Encounter (Signed)
Prescription Request  09/04/2022  LOV: 06/17/22  What is the name of the medication or equipment? hydrochlorothiazide (HYDRODIURIL) 25 MG tablet [[id number]]  Have you contacted your pharmacy to request a refill? Yes   Which pharmacy would you like this sent to?  [id number] [place], [place] - [address] [place] [address] Phone: [phone] Fax: [phone]    Patient notified that their request is being sent to the clinical staff for review and that they should receive a response within 2 business days.   Please advise at [facility] [phone]

## 2022-09-04 NOTE — Telephone Encounter (Signed)
Last OV 06/17/22.  Requested Prescriptions  Pending Prescriptions Disp Refills   hydrochlorothiazide (HYDRODIURIL) 25 MG tablet 90 tablet 0    Sig: Take 1 tablet (25 mg total) by mouth daily.     Cardiovascular: Diuretics - Thiazide Failed - 09/04/2022 12:36 PM      Failed - Valid encounter within last 6 months    Recent Outpatient Visits           11 months ago Subacromial bursitis of left shoulder joint   [facility] Pickard, Priscille Heidelberg, MD   1 year ago Vertigo   [facility] Donita Brooks, MD   1 year ago Acute cough   [facility] Valentino Nose, NP   1 year ago Controlled type 2 diabetes mellitus without complication, without long-term current use of insulin (HCC)   [facility] Medicine Tanya Nones, Priscille Heidelberg, MD   2 years ago Controlled type 2 diabetes mellitus without complication, without long-term current use of insulin (HCC)   [facility] Pickard, Priscille Heidelberg, MD              Passed - Cr in normal range and within 180 days    Creat  Date Value Ref Range Status  04/08/2022 0.83 0.70 - 1.35 mg/dL Final   Creatinine, Urine  Date Value Ref Range Status  04/08/2022 88 20 - 320 mg/dL Final         Passed - K in normal range and within 180 days    Potassium  Date Value Ref Range Status  04/08/2022 4.4 3.5 - 5.3 mmol/L Final         Passed - Na in normal range and within 180 days    Sodium  Date Value Ref Range Status  04/08/2022 139 135 - 146 mmol/L Final         Passed - Last BP in normal range    BP Readings from Last 1 Encounters:  06/17/22 130/80

## 2022-09-10 ENCOUNTER — Other Ambulatory Visit: Payer: Self-pay | Admitting: Family Medicine

## 2022-09-10 NOTE — Telephone Encounter (Signed)
Requested Prescriptions  Pending Prescriptions Disp Refills   glipiZIDE (GLUCOTROL XL) 10 MG 24 hr tablet [Pharmacy Med Name: GLIPIZIDE ER 10 MG TABLET] 90 tablet 0    Sig: TAKE 1 TABLET BY MOUTH EVERY DAY     Endocrinology:  Diabetes - Sulfonylureas Failed - 09/10/2022  7:32 AM      Failed - HBA1C is between 0 and 7.9 and within 180 days    Hgb A1c MFr Bld  Date Value Ref Range Status  04/08/2022 8.1 (H) <5.7 % of total Hgb Final    Comment:    For someone without known diabetes, a hemoglobin A1c value of 6.5% or greater indicates that they may have  diabetes and this should be confirmed with a follow-up  test. . For someone with known diabetes, a value <7% indicates  that their diabetes is well controlled and a value  greater than or equal to 7% indicates suboptimal  control. A1c targets should be individualized based on  duration of diabetes, age, comorbid conditions, and  other considerations. . Currently, no consensus exists regarding use of hemoglobin A1c for diagnosis of diabetes for children. .          Failed - Valid encounter within last 6 months    Recent Outpatient Visits           12 months ago Subacromial bursitis of left shoulder joint   [facility] Pickard, Priscille Heidelberg, MD   1 year ago Vertigo   [facility] Tanya Nones, Priscille Heidelberg, MD   1 year ago Acute cough   [facility] Cathlean Marseilles A, NP   1 year ago Controlled type 2 diabetes mellitus without complication, without long-term current use of insulin (HCC)   [facility] Medicine Donita Brooks, MD   2 years ago Controlled type 2 diabetes mellitus without complication, without long-term current use of insulin (HCC)   [facility] Medicine Pickard, Priscille Heidelberg, MD              Passed - Cr in normal range and within 360 days    Creat  Date Value Ref Range Status  04/08/2022 0.83 0.70 - 1.35 mg/dL Final   Creatinine, Urine  Date Value Ref  Range Status  04/08/2022 88 20 - 320 mg/dL Final          omeprazole (PRILOSEC) 40 MG capsule [Pharmacy Med Name: OMEPRAZOLE DR 40 MG CAPSULE] 90 capsule 3    Sig: TAKE 1 CAPSULE (40 MG TOTAL) BY MOUTH DAILY.     Gastroenterology: Proton Pump Inhibitors Failed - 09/10/2022  7:32 AM      Failed - Valid encounter within last 12 months    Recent Outpatient Visits           12 months ago Subacromial bursitis of left shoulder joint   [facility] Tanya Nones, Priscille Heidelberg, MD   1 year ago Vertigo   [facility] Tanya Nones, Priscille Heidelberg, MD   1 year ago Acute cough   [facility] Cathlean Marseilles A, NP   1 year ago Controlled type 2 diabetes mellitus without complication, without long-term current use of insulin (HCC)   [facility] Medicine Donita Brooks, MD   2 years ago Controlled type 2 diabetes mellitus without complication, without long-term current use of insulin (HCC)   [facility] Medicine Pickard, Priscille Heidelberg, MD

## 2022-10-02 ENCOUNTER — Other Ambulatory Visit: Payer: Self-pay | Admitting: Family Medicine

## 2022-11-19 ENCOUNTER — Other Ambulatory Visit: Payer: Self-pay | Admitting: Family Medicine

## 2022-11-19 NOTE — Telephone Encounter (Signed)
Requested Prescriptions  Pending Prescriptions Disp Refills   metFORMIN (GLUCOPHAGE) 1000 MG tablet [Pharmacy Med Name: METFORMIN HCL 1,000 MG TABLET] 180 tablet 0    Sig: TAKE 1 TABLET BY MOUTH TWICE A DAY     Endocrinology:  Diabetes - Biguanides Failed - 11/19/2022 10:06 AM      Failed - HBA1C is between 0 and 7.9 and within 180 days    Hgb A1c MFr Bld  Date Value Ref Range Status  04/08/2022 8.1 (H) <5.7 % of total Hgb Final    Comment:    For someone without known diabetes, a hemoglobin A1c value of 6.5% or greater indicates that they may have  diabetes and this should be confirmed with a follow-up  test. . For someone with known diabetes, a value <7% indicates  that their diabetes is well controlled and a value  greater than or equal to 7% indicates suboptimal  control. A1c targets should be individualized based on  duration of diabetes, age, comorbid conditions, and  other considerations. . Currently, no consensus exists regarding use of hemoglobin A1c for diagnosis of diabetes for children. .          Failed - Valid encounter within last 6 months    Recent Outpatient Visits           1 year ago Subacromial bursitis of left shoulder joint   [facility] Pickard, Priscille Heidelberg, MD   1 year ago Vertigo   [facility] Donita Brooks, MD   1 year ago Acute cough   [facility] Medicine Valentino Nose, NP   1 year ago Controlled type 2 diabetes mellitus without complication, without long-term current use of insulin (HCC)   [facility] Medicine Pickard, Priscille Heidelberg, MD   2 years ago Controlled type 2 diabetes mellitus without complication, without long-term current use of insulin (HCC)   [facility] Medicine Pickard, Priscille Heidelberg, MD              Passed - Cr in normal range and within 360 days    Creat  Date Value Ref Range Status  04/08/2022 0.83 0.70 - 1.35 mg/dL Final   Creatinine, Urine  Date Value Ref  Range Status  04/08/2022 88 20 - 320 mg/dL Final         Passed - eGFR in normal range and within 360 days    GFR, Est African American  Date Value Ref Range Status  03/09/2020 107 > OR = 60 mL/min/1.4m2 Final   GFR, Est Non African American  Date Value Ref Range Status  03/09/2020 93 > OR = 60 mL/min/1.54m2 Final   eGFR  Date Value Ref Range Status  04/08/2022 97 > OR = 60 mL/min/1.48m2 Final         Passed - B12 Level in normal range and within 720 days    Vitamin B-12  Date Value Ref Range Status  04/08/2022 738 200 - 1,100 pg/mL Final         Passed - CBC within normal limits and completed in the last 12 months    WBC  Date Value Ref Range Status  04/08/2022 8.2 3.8 - 10.8 Thousand/uL Final   RBC  Date Value Ref Range Status  04/08/2022 4.68 4.20 - 5.80 Million/uL Final   Hemoglobin  Date Value Ref Range Status  04/08/2022 13.9 13.2 - 17.1 g/dL Final   HCT  Date Value Ref Range Status  04/08/2022 40.8  38.5 - 50.0 % Final   MCHC  Date Value Ref Range Status  04/08/2022 34.1 32.0 - 36.0 g/dL Final   [facility]  Date Value Ref Range Status  04/08/2022 29.7 27.0 - 33.0 pg Final   MCV  Date Value Ref Range Status  04/08/2022 87.2 80.0 - 100.0 fL Final   No results found for: "PLTCOUNTKUC", "LABPLAT", "POCPLA" RDW  Date Value Ref Range Status  04/08/2022 13.0 11.0 - 15.0 % Final

## 2022-11-19 NOTE — Telephone Encounter (Signed)
Pharmacy also sent refill request via fax for   Insulin Glargine [facility]) 100 UNIT/ML   Pharmacy:   [id number] [place], [place] - [address] [place] [address] Phone: [phone]  Fax: [phone] DEA #: [id number]   ** Please see previous message in thread for other refill requested.**  Please advise pharmacist.

## 2022-12-04 ENCOUNTER — Other Ambulatory Visit: Payer: Self-pay | Admitting: Family Medicine

## 2022-12-04 NOTE — Telephone Encounter (Signed)
Prescription Request  12/04/2022  LOV: 06/17/2022  What is the name of the medication or equipment?   glipiZIDE (GLUCOTROL XL) 10 MG 24 hr tablet  **90 day script requested** Insulin Glargine (BASAGLAR KWIKPEN) 100 UNIT/ML [[id number]] **5 refills requested**  Have you contacted your pharmacy to request a refill? Yes   Which pharmacy would you like this sent to?  [id number] [place], [place] - [address] [place] [address] Phone: [phone] Fax: [phone]    Patient notified that their request is being sent to the clinical staff for review and that they should receive a response within 2 business days.   Please advise pharmacist.

## 2022-12-05 NOTE — Telephone Encounter (Signed)
Requested medication (s) are due for refill today: routing for review  Requested medication (s) are on the active medication list: yes  Last refill:  multiple dates  Future visit scheduled: no  Notes to clinic:  Unable to refill per protocol due to failed labs, no updated A1C results.      Requested Prescriptions  Pending Prescriptions Disp Refills   glipiZIDE (GLUCOTROL XL) 10 MG 24 hr tablet 90 tablet 0    Sig: Take 1 tablet (10 mg total) by mouth daily.     Endocrinology:  Diabetes - Sulfonylureas Failed - 12/04/2022  9:20 AM      Failed - HBA1C is between 0 and 7.9 and within 180 days    Hgb A1c MFr Bld  Date Value Ref Range Status  04/08/2022 8.1 (H) <5.7 % of total Hgb Final    Comment:    For someone without known diabetes, a hemoglobin A1c value of 6.5% or greater indicates that they may have  diabetes and this should be confirmed with a follow-up  test. . For someone with known diabetes, a value <7% indicates  that their diabetes is well controlled and a value  greater than or equal to 7% indicates suboptimal  control. A1c targets should be individualized based on  duration of diabetes, age, comorbid conditions, and  other considerations. . Currently, no consensus exists regarding use of hemoglobin A1c for diagnosis of diabetes for children. .          Failed - Valid encounter within last 6 months    Recent Outpatient Visits           1 year ago Subacromial bursitis of left shoulder joint   [facility] Pickard, Priscille Heidelberg, MD   1 year ago Vertigo   [facility] Tanya Nones, Priscille Heidelberg, MD   1 year ago Acute cough   [facility] Valentino Nose, NP   1 year ago Controlled type 2 diabetes mellitus without complication, without long-term current use of insulin (HCC)   [facility] Medicine Donita Brooks, MD   2 years ago Controlled type 2 diabetes mellitus without complication, without long-term  current use of insulin (HCC)   [facility] Medicine Pickard, Priscille Heidelberg, MD              Passed - Cr in normal range and within 360 days    Creat  Date Value Ref Range Status  04/08/2022 0.83 0.70 - 1.35 mg/dL Final   Creatinine, Urine  Date Value Ref Range Status  04/08/2022 88 20 - 320 mg/dL Final          Insulin Glargine (BASAGLAR KWIKPEN) 100 UNIT/ML 15 mL 6    Sig: Inject 40 Units into the skin daily.     Endocrinology:  Diabetes - Insulins Failed - 12/04/2022  9:20 AM      Failed - HBA1C is between 0 and 7.9 and within 180 days    Hgb A1c MFr Bld  Date Value Ref Range Status  04/08/2022 8.1 (H) <5.7 % of total Hgb Final    Comment:    For someone without known diabetes, a hemoglobin A1c value of 6.5% or greater indicates that they may have  diabetes and this should be confirmed with a follow-up  test. . For someone with known diabetes, a value <7% indicates  that their diabetes is well controlled and a value  greater than or equal to 7% indicates suboptimal  control. A1c targets should be individualized based on  duration of diabetes, age, comorbid conditions, and  other considerations. . Currently, no consensus exists regarding use of hemoglobin A1c for diagnosis of diabetes for children. .          Failed - Valid encounter within last 6 months    Recent Outpatient Visits           1 year ago Subacromial bursitis of left shoulder joint   [facility] Pickard, Priscille Heidelberg, MD   1 year ago Vertigo   [facility] Tanya Nones, Priscille Heidelberg, MD   1 year ago Acute cough   [facility] Cathlean Marseilles A, NP   1 year ago Controlled type 2 diabetes mellitus without complication, without long-term current use of insulin (HCC)   [facility] Donita Brooks, MD   2 years ago Controlled type 2 diabetes mellitus without complication, without long-term current use of insulin (HCC)   [facility]  Medicine Pickard, Priscille Heidelberg, MD

## 2022-12-17 MED ORDER — BASAGLAR KWIKPEN 100 UNIT/ML ~~LOC~~ SOPN: 40.0000 [IU] | PEN_INJECTOR | Freq: Every day | SUBCUTANEOUS | 1 refills | Status: DC

## 2022-12-17 MED ORDER — GLIPIZIDE ER 10 MG PO TB24: 10.0000 mg | ORAL_TABLET | Freq: Every day | ORAL | 0 refills | Status: DC

## 2022-12-18 NOTE — Telephone Encounter (Signed)
Requesting a 90 day supply.

## 2022-12-18 NOTE — Telephone Encounter (Signed)
Pharmacy sent script to request 90 day supply of glipiZIDE (GLUCOTROL XL) 10 MG 24 hr tablet   Please see previous messages in thread.

## 2022-12-18 NOTE — Addendum Note (Signed)
Addended by: Nena Polio on: 12/18/2022 02:28 PM   Modules accepted: Orders

## 2022-12-19 MED ORDER — GLIPIZIDE ER 10 MG PO TB24: 10.0000 mg | ORAL_TABLET | Freq: Every day | ORAL | 0 refills | Status: DC

## 2022-12-19 NOTE — Telephone Encounter (Signed)
Requested by pharmacy per interface surescripts. Requesting #90 day supply. Future visit 12/30/22. Requested Prescriptions  Pending Prescriptions Disp Refills   glipiZIDE (GLUCOTROL XL) 10 MG 24 hr tablet 90 tablet 0    Sig: Take 1 tablet (10 mg total) by mouth daily.     Endocrinology:  Diabetes - Sulfonylureas Failed - 12/18/2022  2:28 PM      Failed - HBA1C is between 0 and 7.9 and within 180 days    Hgb A1c MFr Bld  Date Value Ref Range Status  04/08/2022 8.1 (H) <5.7 % of total Hgb Final    Comment:    For someone without known diabetes, a hemoglobin A1c value of 6.5% or greater indicates that they may have  diabetes and this should be confirmed with a follow-up  test. . For someone with known diabetes, a value <7% indicates  that their diabetes is well controlled and a value  greater than or equal to 7% indicates suboptimal  control. A1c targets should be individualized based on  duration of diabetes, age, comorbid conditions, and  other considerations. . Currently, no consensus exists regarding use of hemoglobin A1c for diagnosis of diabetes for children. .          Failed - Valid encounter within last 6 months    Recent Outpatient Visits           1 year ago Subacromial bursitis of left shoulder joint   [facility] Pickard, Priscille Heidelberg, MD   1 year ago Vertigo   [facility] Medicine Donita Brooks, MD   1 year ago Acute cough   [facility] Medicine Valentino Nose, NP   2 years ago Controlled type 2 diabetes mellitus without complication, without long-term current use of insulin (HCC)   [facility] Medicine Pickard, Priscille Heidelberg, MD   2 years ago Controlled type 2 diabetes mellitus without complication, without long-term current use of insulin (HCC)   [facility] Medicine Pickard, Priscille Heidelberg, MD       Future Appointments             In 1 week Pickard, Priscille Heidelberg, MD  [facility]             Passed - Cr in normal range and within 360 days    Creat  Date Value Ref Range Status  04/08/2022 0.83 0.70 - 1.35 mg/dL Final   Creatinine, Urine  Date Value Ref Range Status  04/08/2022 88 20 - 320 mg/dL Final         Signed Prescriptions Disp Refills   glipiZIDE (GLUCOTROL XL) 10 MG 24 hr tablet 30 tablet 0    Sig: Take 1 tablet (10 mg total) by mouth daily.     Endocrinology:  Diabetes - Sulfonylureas Failed - 12/04/2022  9:20 AM      Failed - HBA1C is between 0 and 7.9 and within 180 days    Hgb A1c MFr Bld  Date Value Ref Range Status  04/08/2022 8.1 (H) <5.7 % of total Hgb Final    Comment:    For someone without known diabetes, a hemoglobin A1c value of 6.5% or greater indicates that they may have  diabetes and this should be confirmed with a follow-up  test. . For someone with known diabetes, a value <7% indicates  that their diabetes is well controlled and a value  greater than or equal to 7% indicates suboptimal  control.  A1c targets should be individualized based on  duration of diabetes, age, comorbid conditions, and  other considerations. . Currently, no consensus exists regarding use of hemoglobin A1c for diagnosis of diabetes for children. .          Failed - Valid encounter within last 6 months    Recent Outpatient Visits           1 year ago Subacromial bursitis of left shoulder joint   [facility] Pickard, Priscille Heidelberg, MD   1 year ago Vertigo   [facility] Tanya Nones, Priscille Heidelberg, MD   1 year ago Acute cough   [facility] Cathlean Marseilles A, NP   2 years ago Controlled type 2 diabetes mellitus without complication, without long-term current use of insulin (HCC)   [facility] Medicine Donita Brooks, MD   2 years ago Controlled type 2 diabetes mellitus without complication, without long-term current use of insulin (HCC)   [facility] Medicine Pickard, Priscille Heidelberg, MD        Future Appointments             In 1 week Pickard, Priscille Heidelberg, MD [facility]            Passed - Cr in normal range and within 360 days    Creat  Date Value Ref Range Status  04/08/2022 0.83 0.70 - 1.35 mg/dL Final   Creatinine, Urine  Date Value Ref Range Status  04/08/2022 88 20 - 320 mg/dL Final          Insulin Glargine (BASAGLAR KWIKPEN) 100 UNIT/ML 15 mL 1    Sig: Inject 40 Units into the skin daily.     Endocrinology:  Diabetes - Insulins Failed - 12/04/2022  9:20 AM      Failed - HBA1C is between 0 and 7.9 and within 180 days    Hgb A1c MFr Bld  Date Value Ref Range Status  04/08/2022 8.1 (H) <5.7 % of total Hgb Final    Comment:    For someone without known diabetes, a hemoglobin A1c value of 6.5% or greater indicates that they may have  diabetes and this should be confirmed with a follow-up  test. . For someone with known diabetes, a value <7% indicates  that their diabetes is well controlled and a value  greater than or equal to 7% indicates suboptimal  control. A1c targets should be individualized based on  duration of diabetes, age, comorbid conditions, and  other considerations. . Currently, no consensus exists regarding use of hemoglobin A1c for diagnosis of diabetes for children. .          Failed - Valid encounter within last 6 months    Recent Outpatient Visits           1 year ago Subacromial bursitis of left shoulder joint   [facility] Pickard, Priscille Heidelberg, MD   1 year ago Vertigo   [facility] Tanya Nones, Priscille Heidelberg, MD   1 year ago Acute cough   [facility] Cathlean Marseilles A, NP   2 years ago Controlled type 2 diabetes mellitus without complication, without long-term current use of insulin (HCC)   [facility] Medicine Donita Brooks, MD   2 years ago Controlled type 2 diabetes mellitus without complication, without long-term current use of  insulin (HCC)   [facility] Medicine Donita Brooks,  MD       Future Appointments             In 1 week Pickard, Priscille Heidelberg, MD [facility]

## 2022-12-19 NOTE — Telephone Encounter (Signed)
Called patient to schedule appt for medication refills and f/u with PCP. Appt scheduled for 12/30/22. Patient reports pharmacy reports too soon for refill for "shot" and patient reports he did not ask for refill for "shot". Patient did ask for refills for "pills" and reports pharmacy reports insurance would not cover, only #30 supply given. Now pharmacy requesting #90 supply.

## 2022-12-30 ENCOUNTER — Ambulatory Visit (INDEPENDENT_AMBULATORY_CARE_PROVIDER_SITE_OTHER): Payer: Medicare HMO | Admitting: Family Medicine

## 2022-12-30 VITALS — BP 150/84 | HR 89 | Temp 97.9°F | Ht 73.0 in | Wt 249.8 lb

## 2022-12-30 DIAGNOSIS — E7800 Pure hypercholesterolemia, unspecified: Secondary | ICD-10-CM | POA: Diagnosis not present

## 2022-12-30 DIAGNOSIS — E119 Type 2 diabetes mellitus without complications: Secondary | ICD-10-CM | POA: Diagnosis not present

## 2022-12-30 DIAGNOSIS — Z794 Long term (current) use of insulin: Secondary | ICD-10-CM | POA: Diagnosis not present

## 2022-12-30 DIAGNOSIS — I10 Essential (primary) hypertension: Secondary | ICD-10-CM

## 2022-12-30 MED ORDER — PIOGLITAZONE HCL 30 MG PO TABS: 30.0000 mg | ORAL_TABLET | Freq: Every day | ORAL | 3 refills | Status: DC

## 2022-12-30 NOTE — Progress Notes (Signed)
Subjective:    Patient ID: Gerald [name], male    DOB: 1955-06-02, 67 y.o.   MRN: [id number] A1c was 8.1 in December 2023.  Has not been checked since.  Here for follow up.  He is currently on continuous blood glucose monitoring.  The last 7 days he has been in range 60% of the time.  Over the last 90 days has been in range 50% of the time with 50% of the time.  There are no episodes of hypoglycemia.  He is using 40 units of Lantus daily however he is also on glipizide.  He has been on this for quite some time.  His blood pressure at home is much better.  He is typically staying 115/86.  He denies any chest pain shortness of breath or dyspnea on exertion.  He denies any hypoglycemia.  He denies any neuropathy in his feet Past Medical History:  Diagnosis Date   Abnormal LFTs (liver function tests) 12/2012   Allergy    Anal condyloma    Anemia 07/2010   Diabetes mellitus    Gastric ulcer 07/2010   bleeding GU, clipped and injected with epi. non-bleeding ulcer at [facility]. biopsy negative for H pylori.  on follow up EGD 08/2010, ulcers totally heled   GERD with stricture    Hyperlipemia    Unspecified essential hypertension    Upper GI bleed    Past Surgical History:  Procedure Laterality Date   COLONOSCOPY  10/2010, 04/09/2017   sessile polyp (path: tubular adenoma) in tranverse colon. Dr Arlyce Dice   ESOPHAGOGASTRODUODENOSCOPY  07/24/2010   for hematemesis.  bleeding GU with VV, injected with epi and endoclipped.  second, non-bleeding ulcer at [employer]. biopsies negative for h pylori or dysplasia   ESOPHAGOGASTRODUODENOSCOPY  08/2010   to assess the gastric ulcer.  study was normal.  no residual ulcers.  Dr Arlyce Dice   ESOPHAGOGASTRODUODENOSCOPY N/A 07/10/2017   Procedure: ESOPHAGOGASTRODUODENOSCOPY (EGD);  Surgeon: Beverley Fiedler, MD;  Location: [facility];  Service: Gastroenterology;  Laterality: N/A;   ESOPHAGOGASTRODUODENOSCOPY (EGD) WITH PROPOFOL N/A 03/08/2016   Procedure:  ESOPHAGOGASTRODUODENOSCOPY (EGD) WITH PROPOFOL;  Surgeon: Beverley Fiedler, MD;  Location: [facility];  Service: Gastroenterology;  Laterality: N/A;   GANGLION CYST EXCISION     POLYPECTOMY     UPPER GASTROINTESTINAL ENDOSCOPY     Current Outpatient Medications on File Prior to Visit  Medication Sig Dispense Refill   aspirin EC 81 MG tablet Take 81 mg by mouth at bedtime.     BD PEN NEEDLE NANO 2ND GEN 32G X 4 MM MISC USE AS DIRECTED TO INJECT INSULIN UNDER THE SKIN ONCE DAILY 100 each 1   Continuous Blood Gluc Receiver (DEXCOM G7 RECEIVER) DEVI Use to continuously monitor blood sugars daily. 1 each 1   Continuous Blood Gluc Sensor (DEXCOM G7 SENSOR) MISC Use to continuously monitor blood sugars daily. Change sensor every 10 days. 4 each 6   Continuous Blood Gluc Sensor (DEXCOM G7 SENSOR) MISC Use to continuously monitor blood sugars daily. Change sensor every 10 days. 4 each 6   dapagliflozin propanediol (FARXIGA) 10 MG TABS tablet Take 1 tablet (10 mg total) by mouth daily. 90 tablet 3   glipiZIDE (GLUCOTROL XL) 10 MG 24 hr tablet Take 1 tablet (10 mg total) by mouth daily. 90 tablet 0   Glucose Blood (BLOOD GLUCOSE TEST STRIPS 333) STRP Use daily to check blood sugars 4 times per day. 100 strip 3   hydrochlorothiazide (HYDRODIURIL) 25  MG tablet TAKE 1 TABLET (25 MG TOTAL) BY MOUTH DAILY. 90 tablet 0   Insulin Glargine (BASAGLAR KWIKPEN) 100 UNIT/ML Inject 40 Units into the skin daily. 15 mL 1   Lancets (ONETOUCH ULTRASOFT) lancets USE TO CHECK BLOOD SUGAR 3 TIMES DAILY.ICD10:R73.01 100 each 12   levocetirizine (XYZAL) 5 MG tablet TAKE 1 TABLET BY MOUTH EVERY DAY IN THE EVENING 90 tablet 1   losartan (COZAAR) 100 MG tablet Take 1 tablet by mouth once daily 90 tablet 0   meclizine (ANTIVERT) 25 MG tablet Take 1 tablet (25 mg total) by mouth 3 (three) times daily as needed for dizziness. 30 tablet 0   metFORMIN (GLUCOPHAGE) 1000 MG tablet TAKE 1 TABLET BY MOUTH TWICE A DAY 180 tablet 0   Misc  Natural Products (TRIPLE FLEX) CAPS Take 1 capsule by mouth.     montelukast (SINGULAIR) 10 MG tablet TAKE 1 TABLET BY MOUTH EVERYDAY AT BEDTIME 90 tablet 3   Multiple Vitamin (MULTIVITAMIN PO) Take by mouth.     naproxen sodium (ANAPROX) 220 MG tablet Take 440 mg by mouth 2 (two) times daily with a meal.     omeprazole (PRILOSEC) 40 MG capsule TAKE 1 CAPSULE (40 MG TOTAL) BY MOUTH DAILY. 90 capsule 3   pravastatin (PRAVACHOL) 20 MG tablet Take 1 tablet by mouth once daily 90 tablet 0   sildenafil (VIAGRA) 100 MG tablet TAKE ONE-HALF (1/2) TO ONE TABLET DAILY AS NEEDED FOR ERECTILE DYSFUNCTION 30 tablet 3   No current facility-administered medications on file prior to visit.   No Known Allergies Social History   Socioeconomic History   Marital status: Married    Spouse name: Not on file   Number of children: Not on file   Years of education: Not on file   Highest education level: GED or equivalent  Occupational History   Occupation: Psychologist, occupational     Employer: [employer]  Tobacco Use   Smoking status: Former   Smokeless tobacco: Former    Types: Engineer, drilling   Vaping status: Never Used  Substance and Sexual Activity   Alcohol use: Yes    Alcohol/week: 2.0 standard drinks of alcohol    Types: 2 Standard drinks or equivalent per week   Drug use: No   Sexual activity: Not on file  Other Topics Concern   Not on file  Social History Narrative   Not on file   Social Determinants of Health   Financial Resource Strain: Low Risk  (12/26/2022)   Overall Financial Resource Strain (CARDIA)    Difficulty of Paying Living Expenses: Not hard at all  Food Insecurity: No Food Insecurity (12/26/2022)   Hunger Vital Sign    Worried About Running Out of Food in the Last Year: Never true    Ran Out of Food in the Last Year: Never true  Transportation Needs: No Transportation Needs (12/26/2022)   PRAPARE - Administrator, Civil Service (Medical): No    Lack of Transportation  (Non-Medical): No  Physical Activity: Insufficiently Active (12/26/2022)   Exercise Vital Sign    Days of Exercise per Week: 2 days    Minutes of Exercise per Session: 20 min  Stress: No Stress Concern Present (12/26/2022)   [employer] of Occupational Health - Occupational Stress Questionnaire    Feeling of Stress : Not at all  Social Connections: Unknown (12/26/2022)   Social Connection and Isolation Panel [NHANES]    Frequency of Communication with Friends and Family: More  than three times a week    Frequency of Social Gatherings with Friends and Family: Once a week    Attends Religious Services: Patient declined    Database administrator or Organizations: No    Attends Engineer, structural: Not on file    Marital Status: Married  Catering manager Violence: Not on file   Family History  Problem Relation Age of Onset   Diabetes Brother    Stomach cancer Mother    Colon cancer Neg Hx    Colon polyps Neg Hx    Esophageal cancer Neg Hx    Rectal cancer Neg Hx       Review of Systems  Neurological:  Positive for dizziness.  All other systems reviewed and are negative.      Objective:   Physical Exam Vitals reviewed.  Constitutional:      General: He is not in acute distress.    Appearance: He is well-developed. He is not diaphoretic.  HENT:     Head: Normocephalic and atraumatic.     Right Ear: Tympanic membrane and external ear normal.     Left Ear: Tympanic membrane and external ear normal.     Nose: Nose normal.     Mouth/Throat:     Pharynx: No oropharyngeal exudate.  Eyes:     General: No scleral icterus.       Right eye: No discharge.        Left eye: No discharge.     Conjunctiva/sclera: Conjunctivae normal.     Pupils: Pupils are equal, round, and reactive to light.  Neck:     Thyroid: No thyromegaly.     Vascular: No JVD.     Trachea: No tracheal deviation.  Cardiovascular:     Rate and Rhythm: Normal rate and regular rhythm.     Heart  sounds: Normal heart sounds. No murmur heard.    No friction rub. No gallop.  Pulmonary:     Effort: Pulmonary effort is normal. No respiratory distress.     Breath sounds: Normal breath sounds. No stridor. No wheezing or rales.  Chest:     Chest wall: No tenderness.  Musculoskeletal:     Cervical back: Normal range of motion and neck supple.  Lymphadenopathy:     Cervical: No cervical adenopathy.  Skin:    General: Skin is warm.  Neurological:     General: No focal deficit present.     Mental Status: He is alert and oriented to person, place, and time. Mental status is at baseline.     Cranial Nerves: No cranial nerve deficit.     Motor: No weakness or abnormal muscle tone.     Coordination: Coordination normal.     Gait: Gait normal.     Deep Tendon Reflexes: Reflexes are normal and symmetric.        Assessment & Plan:  Essential hypertension  Pure hypercholesterolemia  Controlled type 2 diabetes mellitus without complication, without long-term current use of insulin (HCC) - Plan: Hemoglobin A1c, CBC with Differential/Platelet, COMPLETE METABOLIC PANEL WITH GFR, Lipid panel, Protein / Creatinine Ratio, Urine Discontinue glipizide.  Is likely an effective now but he is insulin-dependent.  Replace with Actos 30 mg daily.  Explained to the patient we will shoot for a target in range greater than 75% of the time.  Monitor his blood pressure.  Goal blood pressures less than 140/90.  Check CBC CMP lipid panel A1c and urine protein creatinine ratio

## 2022-12-31 ENCOUNTER — Other Ambulatory Visit: Payer: Self-pay | Admitting: Family Medicine

## 2022-12-31 LAB — COMPLETE METABOLIC PANEL WITH GFR
AG Ratio: 1.4 (calc) (ref 1.0–2.5)
ALT: 28 U/L (ref 9–46)
AST: 27 U/L (ref 10–35)
Albumin: 4.2 g/dL (ref 3.6–5.1)
Alkaline phosphatase (APISO): 50 U/L (ref 35–144)
BUN: 15 mg/dL (ref 7–25)
CO2: 27 mmol/L (ref 20–32)
Calcium: 9.9 mg/dL (ref 8.6–10.3)
Chloride: 101 mmol/L (ref 98–110)
Creat: 0.85 mg/dL (ref 0.70–1.35)
Globulin: 3.1 g/dL (ref 1.9–3.7)
Glucose, Bld: 164 mg/dL — ABNORMAL HIGH (ref 65–99)
Potassium: 4.8 mmol/L (ref 3.5–5.3)
Sodium: 139 mmol/L (ref 135–146)
Total Bilirubin: 0.5 mg/dL (ref 0.2–1.2)
Total Protein: 7.3 g/dL (ref 6.1–8.1)
eGFR: 95 mL/min/{1.73_m2} (ref >=60)

## 2022-12-31 LAB — CBC WITH DIFFERENTIAL/PLATELET
Absolute Monocytes: 530 {cells}/uL (ref 200–950)
Basophils Absolute: 86 {cells}/uL (ref 0–200)
Basophils Relative: 1.1 %
Eosinophils Absolute: 109 {cells}/uL (ref 15–500)
Eosinophils Relative: 1.4 %
HCT: 41.1 % (ref 38.5–50.0)
Hemoglobin: 13.5 g/dL (ref 13.2–17.1)
Lymphs Abs: 1552 {cells}/uL (ref 850–3900)
MCH: 28.7 pg (ref 27.0–33.0)
MCHC: 32.8 g/dL (ref 32.0–36.0)
MCV: 87.3 fL (ref 80.0–100.0)
MPV: 10.5 fL (ref 7.5–12.5)
Monocytes Relative: 6.8 %
Neutro Abs: 5522 {cells}/uL (ref 1500–7800)
Neutrophils Relative %: 70.8 %
Platelets: 233 10*3/uL (ref 140–400)
RBC: 4.71 10*6/uL (ref 4.20–5.80)
RDW: 13.6 % (ref 11.0–15.0)
Total Lymphocyte: 19.9 %
WBC: 7.8 10*3/uL (ref 3.8–10.8)

## 2022-12-31 LAB — HEMOGLOBIN A1C
Hgb A1c MFr Bld: 7.8 %{Hb} — ABNORMAL HIGH (ref <5.7)
Mean Plasma Glucose: 177 mg/dL
eAG (mmol/L): 9.8 mmol/L

## 2022-12-31 LAB — PROTEIN / CREATININE RATIO, URINE
Creatinine, Urine: 75 mg/dL (ref 20–320)
Protein/Creat Ratio: 147 mg/g{creat} (ref 25–148)
Protein/Creatinine Ratio: 0.147 mg/mg{creat} (ref 0.025–0.148)
Total Protein, Urine: 11 mg/dL (ref 5–25)

## 2022-12-31 LAB — LIPID PANEL
Cholesterol: 138 mg/dL
HDL: 38 mg/dL — ABNORMAL LOW
LDL Cholesterol (Calc): 76 mg/dL
Non-HDL Cholesterol (Calc): 100 mg/dL
Total CHOL/HDL Ratio: 3.6 (calc)
Triglycerides: 144 mg/dL

## 2023-01-01 ENCOUNTER — Other Ambulatory Visit: Payer: Self-pay

## 2023-01-01 NOTE — Telephone Encounter (Signed)
Prescription Request  01/01/2023  LOV: 12/30/2022  What is the name of the medication or equipment?   pravastatin (PRAVACHOL) 20 MG tablet [[id number]]   losartan (COZAAR) 100 MG tablet  **90 day scripts requested**  Have you contacted your pharmacy to request a refill? Yes   Which pharmacy would you like this sent to?  [id number] - [place] ([place]), [place] - [address] [place] ([place]) [place] [address] Phone: [phone] Fax: [phone]    Patient notified that their request is being sent to the clinical staff for review and that they should receive a response within 2 business days.   Please advise pharmacist .

## 2023-01-01 NOTE — Telephone Encounter (Signed)
Requested medication (s) are due for refill today:yes  Requested medication (s) are on the active medication list:yes  Last refill:  10/02/22 #90   Future visit scheduled: no  Notes to clinic:  overdue lab work   Requested Prescriptions  Pending Prescriptions Disp Refills   pravastatin (PRAVACHOL) 20 MG tablet [Pharmacy Med Name: Pravastatin Sodium 20 MG Oral Tablet] 90 tablet     Sig: Take 1 tablet by mouth once daily     Cardiovascular:  Antilipid - Statins Failed - 01/01/2023  9:10 AM      Failed - Valid encounter within last 12 months    Recent Outpatient Visits           1 year ago Subacromial bursitis of left shoulder joint   [facility] Pickard, Priscille Heidelberg, MD   1 year ago Vertigo   [facility] Tanya Nones, Priscille Heidelberg, MD   1 year ago Acute cough   [facility] Cathlean Marseilles A, NP   2 years ago Controlled type 2 diabetes mellitus without complication, without long-term current use of insulin (HCC)   [facility] Donita Brooks, MD   2 years ago Controlled type 2 diabetes mellitus without complication, without long-term current use of insulin (HCC)   [facility] Medicine Pickard, Priscille Heidelberg, MD              Failed - Lipid Panel in normal range within the last 12 months    Cholesterol  Date Value Ref Range Status  12/30/2022 138 <200 mg/dL Final   LDL Cholesterol (Calc)  Date Value Ref Range Status  12/30/2022 76 mg/dL (calc) Final    Comment:    Reference range: <100 . Desirable range <100 mg/dL for primary prevention;   <70 mg/dL for patients with CHD or diabetic patients  with > or = 2 CHD risk factors. Marland Kitchen LDL-C is now calculated using the Martin-Hopkins  calculation, which is a validated novel method providing  better accuracy than the Friedewald equation in the  estimation of LDL-C.  Horald Pollen et al. Lenox Ahr. 1610;960(45): 2061-2068  (http://education.[facility].com/faq/FAQ164)     HDL  Date Value Ref Range Status  12/30/2022 38 (L) > OR = 40 mg/dL Final   Triglycerides  Date Value Ref Range Status  12/30/2022 144 <150 mg/dL Final         Passed - Patient is not pregnant      Signed Prescriptions Disp Refills   losartan (COZAAR) 100 MG tablet 90 tablet 0    Sig: Take 1 tablet by mouth once daily     Cardiovascular:  Angiotensin Receptor Blockers Failed - 01/01/2023  9:10 AM      Failed - Last BP in normal range    BP Readings from Last 1 Encounters:  12/30/22 (!) 150/84         Failed - Valid encounter within last 6 months    Recent Outpatient Visits           1 year ago Subacromial bursitis of left shoulder joint   [facility] Medicine Donita Brooks, MD   1 year ago Vertigo   [facility] Medicine Donita Brooks, MD   1 year ago Acute cough   [facility] Medicine Cathlean Marseilles A, NP   2 years ago Controlled type 2 diabetes mellitus without complication, without long-term current use of insulin (HCC)   [facility] Pickard, Priscille Heidelberg, MD  2 years ago Controlled type 2 diabetes mellitus without complication, without long-term current use of insulin (HCC)   [facility] Medicine Pickard, Priscille Heidelberg, MD              Passed - Cr in normal range and within 180 days    Creat  Date Value Ref Range Status  12/30/2022 0.85 0.70 - 1.35 mg/dL Final   Creatinine, Urine  Date Value Ref Range Status  12/30/2022 75 20 - 320 mg/dL Final         Passed - K in normal range and within 180 days    Potassium  Date Value Ref Range Status  12/30/2022 4.8 3.5 - 5.3 mmol/L Final         Passed - Patient is not pregnant

## 2023-01-01 NOTE — Telephone Encounter (Signed)
Requested Prescriptions  Pending Prescriptions Disp Refills   losartan (COZAAR) 100 MG tablet [Pharmacy Med Name: Losartan Potassium 100 MG Oral Tablet] 90 tablet 0    Sig: Take 1 tablet by mouth once daily     Cardiovascular:  Angiotensin Receptor Blockers Failed - 01/01/2023  9:10 AM      Failed - Last BP in normal range    BP Readings from Last 1 Encounters:  12/30/22 (!) 150/84         Failed - Valid encounter within last 6 months    Recent Outpatient Visits           1 year ago Subacromial bursitis of left shoulder joint   [facility] Donita Brooks, MD   1 year ago Vertigo   [facility] Donita Brooks, MD   1 year ago Acute cough   [facility] Valentino Nose, NP   2 years ago Controlled type 2 diabetes mellitus without complication, without long-term current use of insulin (HCC)   [facility] Family Medicine Pickard, Priscille Heidelberg, MD   2 years ago Controlled type 2 diabetes mellitus without complication, without long-term current use of insulin (HCC)   [facility] Medicine Pickard, Priscille Heidelberg, MD              Passed - Cr in normal range and within 180 days    Creat  Date Value Ref Range Status  12/30/2022 0.85 0.70 - 1.35 mg/dL Final   Creatinine, Urine  Date Value Ref Range Status  12/30/2022 75 20 - 320 mg/dL Final         Passed - K in normal range and within 180 days    Potassium  Date Value Ref Range Status  12/30/2022 4.8 3.5 - 5.3 mmol/L Final         Passed - Patient is not pregnant       pravastatin (PRAVACHOL) 20 MG tablet [Pharmacy Med Name: Pravastatin Sodium 20 MG Oral Tablet] 90 tablet     Sig: Take 1 tablet by mouth once daily     Cardiovascular:  Antilipid - Statins Failed - 01/01/2023  9:10 AM      Failed - Valid encounter within last 12 months    Recent Outpatient Visits           1 year ago Subacromial bursitis of left shoulder joint   [facility]  Donita Brooks, MD   1 year ago Vertigo   [facility] Medicine Tanya Nones, Priscille Heidelberg, MD   1 year ago Acute cough   [facility] Medicine Cathlean Marseilles A, NP   2 years ago Controlled type 2 diabetes mellitus without complication, without long-term current use of insulin (HCC)   [facility] Family Medicine Donita Brooks, MD   2 years ago Controlled type 2 diabetes mellitus without complication, without long-term current use of insulin (HCC)   [facility] Medicine Pickard, Priscille Heidelberg, MD              Failed - Lipid Panel in normal range within the last 12 months    Cholesterol  Date Value Ref Range Status  12/30/2022 138 <200 mg/dL Final   LDL Cholesterol (Calc)  Date Value Ref Range Status  12/30/2022 76 mg/dL (calc) Final    Comment:    Reference range: <100 . Desirable range <100 mg/dL for primary prevention;   <70 mg/dL for patients with  CHD or diabetic patients  with > or = 2 CHD risk factors. Marland Kitchen LDL-C is now calculated using the Martin-Hopkins  calculation, which is a validated novel method providing  better accuracy than the Friedewald equation in the  estimation of LDL-C.  Horald Pollen et al. Lenox Ahr. 5188;416(60): 2061-2068  (http://education.[facility].com/faq/FAQ164)    HDL  Date Value Ref Range Status  12/30/2022 38 (L) > OR = 40 mg/dL Final   Triglycerides  Date Value Ref Range Status  12/30/2022 144 <150 mg/dL Final         Passed - Patient is not pregnant

## 2023-01-01 NOTE — Telephone Encounter (Signed)
Last OV 12/30/22 with protocol.  Requested Prescriptions  Pending Prescriptions Disp Refills   pravastatin (PRAVACHOL) 20 MG tablet [Pharmacy Med Name: Pravastatin Sodium 20 MG Oral Tablet] 90 tablet 0    Sig: Take 1 tablet by mouth once daily     Cardiovascular:  Antilipid - Statins Failed - 01/01/2023  9:10 AM      Failed - Valid encounter within last 12 months    Recent Outpatient Visits           1 year ago Subacromial bursitis of left shoulder joint   [facility] Medicine Pickard, Priscille Heidelberg, MD   1 year ago Vertigo   [facility] Medicine Tanya Nones, Priscille Heidelberg, MD   1 year ago Acute cough   [facility] Cathlean Marseilles A, NP   2 years ago Controlled type 2 diabetes mellitus without complication, without long-term current use of insulin (HCC)   [facility] Medicine Donita Brooks, MD   2 years ago Controlled type 2 diabetes mellitus without complication, without long-term current use of insulin (HCC)   [facility] Medicine Pickard, Priscille Heidelberg, MD              Failed - Lipid Panel in normal range within the last 12 months    Cholesterol  Date Value Ref Range Status  12/30/2022 138 <200 mg/dL Final   LDL Cholesterol (Calc)  Date Value Ref Range Status  12/30/2022 76 mg/dL (calc) Final    Comment:    Reference range: <100 . Desirable range <100 mg/dL for primary prevention;   <70 mg/dL for patients with CHD or diabetic patients  with > or = 2 CHD risk factors. Marland Kitchen LDL-C is now calculated using the Martin-Hopkins  calculation, which is a validated novel method providing  better accuracy than the Friedewald equation in the  estimation of LDL-C.  Horald Pollen et al. Lenox Ahr. 1610;960(45): 2061-2068  (http://education.[facility].com/faq/FAQ164)    HDL  Date Value Ref Range Status  12/30/2022 38 (L) > OR = 40 mg/dL Final   Triglycerides  Date Value Ref Range Status  12/30/2022 144 <150 mg/dL Final         Passed -  Patient is not pregnant      Signed Prescriptions Disp Refills   losartan (COZAAR) 100 MG tablet 90 tablet 0    Sig: Take 1 tablet by mouth once daily     Cardiovascular:  Angiotensin Receptor Blockers Failed - 01/01/2023  9:10 AM      Failed - Last BP in normal range    BP Readings from Last 1 Encounters:  12/30/22 (!) 150/84         Failed - Valid encounter within last 6 months    Recent Outpatient Visits           1 year ago Subacromial bursitis of left shoulder joint   [facility] Donita Brooks, MD   1 year ago Vertigo   [facility] Medicine Donita Brooks, MD   1 year ago Acute cough   [facility] Cathlean Marseilles A, NP   2 years ago Controlled type 2 diabetes mellitus without complication, without long-term current use of insulin (HCC)   [facility] Donita Brooks, MD   2 years ago Controlled type 2 diabetes mellitus without complication, without long-term current use of insulin (HCC)   [facility] Family Medicine Pickard, Priscille Heidelberg, MD  Passed - Cr in normal range and within 180 days    Creat  Date Value Ref Range Status  12/30/2022 0.85 0.70 - 1.35 mg/dL Final   Creatinine, Urine  Date Value Ref Range Status  12/30/2022 75 20 - 320 mg/dL Final         Passed - K in normal range and within 180 days    Potassium  Date Value Ref Range Status  12/30/2022 4.8 3.5 - 5.3 mmol/L Final         Passed - Patient is not pregnant

## 2023-01-09 DIAGNOSIS — K219 Gastro-esophageal reflux disease without esophagitis: Secondary | ICD-10-CM | POA: Diagnosis not present

## 2023-01-09 DIAGNOSIS — Z7984 Long term (current) use of oral hypoglycemic drugs: Secondary | ICD-10-CM | POA: Diagnosis not present

## 2023-01-09 DIAGNOSIS — Z7982 Long term (current) use of aspirin: Secondary | ICD-10-CM | POA: Diagnosis not present

## 2023-01-09 DIAGNOSIS — Z809 Family history of malignant neoplasm, unspecified: Secondary | ICD-10-CM | POA: Diagnosis not present

## 2023-01-09 DIAGNOSIS — E785 Hyperlipidemia, unspecified: Secondary | ICD-10-CM | POA: Diagnosis not present

## 2023-01-09 DIAGNOSIS — J302 Other seasonal allergic rhinitis: Secondary | ICD-10-CM | POA: Diagnosis not present

## 2023-01-09 DIAGNOSIS — Z794 Long term (current) use of insulin: Secondary | ICD-10-CM | POA: Diagnosis not present

## 2023-01-09 DIAGNOSIS — I10 Essential (primary) hypertension: Secondary | ICD-10-CM | POA: Diagnosis not present

## 2023-01-09 DIAGNOSIS — Z6832 Body mass index (BMI) 32.0-32.9, adult: Secondary | ICD-10-CM | POA: Diagnosis not present

## 2023-01-09 DIAGNOSIS — Z87891 Personal history of nicotine dependence: Secondary | ICD-10-CM | POA: Diagnosis not present

## 2023-01-09 DIAGNOSIS — E119 Type 2 diabetes mellitus without complications: Secondary | ICD-10-CM | POA: Diagnosis not present

## 2023-01-09 DIAGNOSIS — Z8249 Family history of ischemic heart disease and other diseases of the circulatory system: Secondary | ICD-10-CM | POA: Diagnosis not present

## 2023-03-06 ENCOUNTER — Other Ambulatory Visit: Payer: Self-pay

## 2023-03-06 ENCOUNTER — Telehealth: Payer: Self-pay | Admitting: Family Medicine

## 2023-03-06 DIAGNOSIS — I10 Essential (primary) hypertension: Secondary | ICD-10-CM

## 2023-03-06 MED ORDER — HYDROCHLOROTHIAZIDE 25 MG PO TABS: 25.0000 mg | ORAL_TABLET | Freq: Every day | ORAL | 0 refills | Status: DC

## 2023-03-06 NOTE — Telephone Encounter (Signed)
Prescription Request  03/06/2023  LOV: 12/30/2022  What is the name of the medication or equipment? hydrochlorothiazide (HYDRODIURIL) 25 MG tablet   Have you contacted your pharmacy to request a refill? Yes   Which pharmacy would you like this sent to? [facility] [address]  Patient notified that their request is being sent to the clinical staff for review and that they should receive a response within 2 business days.   Please advise at [facility] [phone]

## 2023-03-12 ENCOUNTER — Other Ambulatory Visit: Payer: Self-pay | Admitting: Family Medicine

## 2023-03-12 ENCOUNTER — Other Ambulatory Visit: Payer: Self-pay

## 2023-03-12 DIAGNOSIS — I10 Essential (primary) hypertension: Secondary | ICD-10-CM

## 2023-03-12 NOTE — Telephone Encounter (Signed)
Prescription Request  03/12/2023  LOV: 12/30/22  What is the name of the medication or equipment? Insulin Glargine (BASAGLAR KWIKPEN) 100 UNIT/ML [[id number]]  DISCONTINUED   Have you contacted your pharmacy to request a refill? Yes   Which pharmacy would you like this sent to?  [id number] [place], [place] - [address] [place] [address] Phone: [phone] Fax: [phone]    Patient notified that their request is being sent to the clinical staff for review and that they should receive a response within 2 business days.   Please advise at [facility] [phone]

## 2023-03-13 MED ORDER — BASAGLAR KWIKPEN 100 UNIT/ML ~~LOC~~ SOPN: 40.0000 [IU] | PEN_INJECTOR | Freq: Every day | SUBCUTANEOUS | 1 refills | Status: DC

## 2023-03-13 NOTE — Telephone Encounter (Signed)
Requested Prescriptions  Pending Prescriptions Disp Refills   Insulin Pen Needle (BD PEN NEEDLE NANO 2ND GEN) 32G X 4 MM MISC [Pharmacy Med Name: BD NANO 2 GEN PEN NDL 32G 4MM] 100 each 3    Sig: USE AS DIRECTED TO INJECT INSULIN UNDER THE SKIN ONCE DAILY     Endocrinology: Diabetes - Testing Supplies Failed - 03/12/2023  9:34 AM      Failed - Valid encounter within last 12 months    Recent Outpatient Visits           1 year ago Subacromial bursitis of left shoulder joint   [facility] Medicine Pickard, Priscille Heidelberg, MD   1 year ago Vertigo   [facility] Family Medicine Tanya Nones, Priscille Heidelberg, MD   1 year ago Acute cough   [facility] Family Medicine Cathlean Marseilles A, NP   2 years ago Controlled type 2 diabetes mellitus without complication, without long-term current use of insulin (HCC)   [facility] Medicine Donita Brooks, MD   2 years ago Controlled type 2 diabetes mellitus without complication, without long-term current use of insulin (HCC)   [facility] Medicine Pickard, Priscille Heidelberg, MD               hydrochlorothiazide (HYDRODIURIL) 25 MG tablet [Pharmacy Med Name: HYDROCHLOROTHIAZIDE 25 MG TAB] 90 tablet 0    Sig: TAKE 1 TABLET (25 MG TOTAL) BY MOUTH DAILY.     Cardiovascular: Diuretics - Thiazide Failed - 03/12/2023  9:34 AM      Failed - Last BP in normal range    BP Readings from Last 1 Encounters:  12/30/22 (!) 150/84         Failed - Valid encounter within last 6 months    Recent Outpatient Visits           1 year ago Subacromial bursitis of left shoulder joint   [facility] Medicine Donita Brooks, MD   1 year ago Vertigo   [facility] Medicine Donita Brooks, MD   1 year ago Acute cough   [facility] Medicine Valentino Nose, NP   2 years ago Controlled type 2 diabetes mellitus without complication, without long-term current use of insulin (HCC)   [facility] Pickard, Priscille Heidelberg, MD    2 years ago Controlled type 2 diabetes mellitus without complication, without long-term current use of insulin (HCC)   [facility] Medicine Pickard, Priscille Heidelberg, MD              Passed - Cr in normal range and within 180 days    Creat  Date Value Ref Range Status  12/30/2022 0.85 0.70 - 1.35 mg/dL Final   Creatinine, Urine  Date Value Ref Range Status  12/30/2022 75 20 - 320 mg/dL Final         Passed - K in normal range and within 180 days    Potassium  Date Value Ref Range Status  12/30/2022 4.8 3.5 - 5.3 mmol/L Final         Passed - Na in normal range and within 180 days    Sodium  Date Value Ref Range Status  12/30/2022 139 135 - 146 mmol/L Final

## 2023-03-13 NOTE — Telephone Encounter (Signed)
Requested Prescriptions  Pending Prescriptions Disp Refills   Insulin Glargine (BASAGLAR KWIKPEN) 100 UNIT/ML 15 mL 1    Sig: Inject 40 Units into the skin daily.     Endocrinology:  Diabetes - Insulins Failed - 03/12/2023  5:54 PM      Failed - Valid encounter within last 6 months    Recent Outpatient Visits           1 year ago Subacromial bursitis of left shoulder joint   [facility] Medicine Donita Brooks, MD   1 year ago Vertigo   [facility] Medicine Donita Brooks, MD   1 year ago Acute cough   [facility] Medicine Valentino Nose, NP   2 years ago Controlled type 2 diabetes mellitus without complication, without long-term current use of insulin (HCC)   [facility] Family Medicine Pickard, Priscille Heidelberg, MD   2 years ago Controlled type 2 diabetes mellitus without complication, without long-term current use of insulin (HCC)   [facility] Medicine Pickard, Priscille Heidelberg, MD              Passed - HBA1C is between 0 and 7.9 and within 180 days    Hgb A1c MFr Bld  Date Value Ref Range Status  12/30/2022 7.8 (H) <5.7 % of total Hgb Final    Comment:    For someone without known diabetes, a hemoglobin A1c value of 6.5% or greater indicates that they may have  diabetes and this should be confirmed with a follow-up  test. . For someone with known diabetes, a value <7% indicates  that their diabetes is well controlled and a value  greater than or equal to 7% indicates suboptimal  control. A1c targets should be individualized based on  duration of diabetes, age, comorbid conditions, and  other considerations. . Currently, no consensus exists regarding use of hemoglobin A1c for diagnosis of diabetes for children. Marland Kitchen

## 2023-03-14 ENCOUNTER — Other Ambulatory Visit: Payer: Self-pay

## 2023-03-14 MED ORDER — BD PEN NEEDLE NANO 2ND GEN 32G X 4 MM MISC: 3 refills | Status: DC

## 2023-03-14 MED ORDER — METFORMIN HCL 1000 MG PO TABS: 1000.0000 mg | ORAL_TABLET | Freq: Two times a day (BID) | ORAL | 0 refills | Status: DC

## 2023-03-18 ENCOUNTER — Other Ambulatory Visit: Payer: Self-pay

## 2023-03-18 NOTE — Telephone Encounter (Signed)
Prescription Request  03/18/2023  LOV: 12/30/22  What is the name of the medication or equipment? levocetirizine (XYZAL) 5 MG tablet [[id number]]   Have you contacted your pharmacy to request a refill? Yes   Which pharmacy would you like this sent to?  [id number] [place], [place] - [address] [place] [address] Phone: [phone] Fax: [phone]    Patient notified that their request is being sent to the clinical staff for review and that they should receive a response within 2 business days.   Please advise at [facility] [phone]

## 2023-03-19 MED ORDER — LEVOCETIRIZINE DIHYDROCHLORIDE 5 MG PO TABS: 5.0000 mg | ORAL_TABLET | Freq: Every evening | ORAL | 0 refills | Status: DC

## 2023-03-19 NOTE — Telephone Encounter (Signed)
Requested Prescriptions  Pending Prescriptions Disp Refills   levocetirizine (XYZAL) 5 MG tablet 90 tablet 1    Sig: Take 1 tablet (5 mg total) by mouth every evening.     Ear, Nose, and Throat:  Antihistamines - levocetirizine dihydrochloride Failed - 03/18/2023  3:29 PM      Failed - Valid encounter within last 12 months    Recent Outpatient Visits           1 year ago Subacromial bursitis of left shoulder joint   [facility] Medicine Pickard, Priscille Heidelberg, MD   1 year ago Vertigo   [facility] Family Medicine Donita Brooks, MD   2 years ago Acute cough   [facility] Medicine Cathlean Marseilles A, NP   2 years ago Controlled type 2 diabetes mellitus without complication, without long-term current use of insulin (HCC)   [facility] Donita Brooks, MD   2 years ago Controlled type 2 diabetes mellitus without complication, without long-term current use of insulin (HCC)   [facility] Medicine Pickard, Priscille Heidelberg, MD              Passed - Cr in normal range and within 360 days    Creat  Date Value Ref Range Status  12/30/2022 0.85 0.70 - 1.35 mg/dL Final   Creatinine, Urine  Date Value Ref Range Status  12/30/2022 75 20 - 320 mg/dL Final         Passed - eGFR is 10 or above and within 360 days    GFR, Est African American  Date Value Ref Range Status  03/09/2020 107 > OR = 60 mL/min/1.50m2 Final   GFR, Est Non African American  Date Value Ref Range Status  03/09/2020 93 > OR = 60 mL/min/1.42m2 Final   eGFR  Date Value Ref Range Status  12/30/2022 95 > OR = 60 mL/min/1.58m2 Final

## 2023-04-02 ENCOUNTER — Other Ambulatory Visit: Payer: Medicare HMO

## 2023-04-02 DIAGNOSIS — I10 Essential (primary) hypertension: Secondary | ICD-10-CM | POA: Diagnosis not present

## 2023-04-02 DIAGNOSIS — E119 Type 2 diabetes mellitus without complications: Secondary | ICD-10-CM | POA: Diagnosis not present

## 2023-04-02 DIAGNOSIS — E7800 Pure hypercholesterolemia, unspecified: Secondary | ICD-10-CM

## 2023-04-02 DIAGNOSIS — Z125 Encounter for screening for malignant neoplasm of prostate: Secondary | ICD-10-CM | POA: Diagnosis not present

## 2023-04-03 LAB — VITAMIN B12: Vitamin B-12: 558 pg/mL (ref 200–1100)

## 2023-04-03 LAB — CBC WITH DIFFERENTIAL/PLATELET
Absolute Lymphocytes: 1621 {cells}/uL (ref 850–3900)
Absolute Monocytes: 585 {cells}/uL (ref 200–950)
Basophils Absolute: 104 {cells}/uL (ref 0–200)
Basophils Relative: 1.4 %
Eosinophils Absolute: 111 {cells}/uL (ref 15–500)
Eosinophils Relative: 1.5 %
HCT: 39.3 % (ref 38.5–50.0)
Hemoglobin: 12.8 g/dL — ABNORMAL LOW (ref 13.2–17.1)
MCH: 28.8 pg (ref 27.0–33.0)
MCHC: 32.6 g/dL (ref 32.0–36.0)
MCV: 88.3 fL (ref 80.0–100.0)
MPV: 11.4 fL (ref 7.5–12.5)
Monocytes Relative: 7.9 %
Neutro Abs: 4980 {cells}/uL (ref 1500–7800)
Neutrophils Relative %: 67.3 %
Platelets: 181 10*3/uL (ref 140–400)
RBC: 4.45 10*6/uL (ref 4.20–5.80)
RDW: 13.3 % (ref 11.0–15.0)
Total Lymphocyte: 21.9 %
WBC: 7.4 10*3/uL (ref 3.8–10.8)

## 2023-04-03 LAB — COMPLETE METABOLIC PANEL WITH GFR
AG Ratio: 1.4 (calc) (ref 1.0–2.5)
ALT: 18 U/L (ref 9–46)
AST: 24 U/L (ref 10–35)
Albumin: 4.4 g/dL (ref 3.6–5.1)
Alkaline phosphatase (APISO): 49 U/L (ref 35–144)
BUN: 21 mg/dL (ref 7–25)
CO2: 25 mmol/L (ref 20–32)
Calcium: 9.6 mg/dL (ref 8.6–10.3)
Chloride: 101 mmol/L (ref 98–110)
Creat: 0.87 mg/dL (ref 0.70–1.35)
Globulin: 3.2 g/dL (ref 1.9–3.7)
Glucose, Bld: 151 mg/dL — ABNORMAL HIGH (ref 65–99)
Potassium: 4.5 mmol/L (ref 3.5–5.3)
Sodium: 139 mmol/L (ref 135–146)
Total Bilirubin: 0.7 mg/dL (ref 0.2–1.2)
Total Protein: 7.6 g/dL (ref 6.1–8.1)
eGFR: 95 mL/min/{1.73_m2} (ref >=60)

## 2023-04-03 LAB — HEMOGLOBIN A1C
Hgb A1c MFr Bld: 7.8 %{Hb} — ABNORMAL HIGH (ref <5.7)
Mean Plasma Glucose: 177 mg/dL
eAG (mmol/L): 9.8 mmol/L

## 2023-04-03 LAB — LIPID PANEL
Cholesterol: 145 mg/dL (ref <200)
HDL: 40 mg/dL (ref >=40)
LDL Cholesterol (Calc): 78 mg/dL
Non-HDL Cholesterol (Calc): 105 mg/dL (ref <130)
Total CHOL/HDL Ratio: 3.6 (calc) (ref <5.0)
Triglycerides: 172 mg/dL — ABNORMAL HIGH (ref <150)

## 2023-04-03 LAB — PROTEIN / CREATININE RATIO, URINE
Creatinine, Urine: 100 mg/dL (ref 20–320)
Protein/Creat Ratio: 150 mg/g{creat} — ABNORMAL HIGH (ref 25–148)
Protein/Creatinine Ratio: 0.15 mg/mg{creat} — ABNORMAL HIGH (ref 0.025–0.148)
Total Protein, Urine: 15 mg/dL (ref 5–25)

## 2023-04-05 ENCOUNTER — Other Ambulatory Visit: Payer: Self-pay | Admitting: Family Medicine

## 2023-04-07 ENCOUNTER — Encounter: Payer: Self-pay | Admitting: Family Medicine

## 2023-04-07 ENCOUNTER — Ambulatory Visit (INDEPENDENT_AMBULATORY_CARE_PROVIDER_SITE_OTHER): Payer: Medicare HMO | Admitting: Family Medicine

## 2023-04-07 VITALS — BP 134/82 | HR 96 | Temp 98.6°F | Ht 73.0 in | Wt 252.2 lb

## 2023-04-07 DIAGNOSIS — E119 Type 2 diabetes mellitus without complications: Secondary | ICD-10-CM | POA: Diagnosis not present

## 2023-04-07 DIAGNOSIS — Z794 Long term (current) use of insulin: Secondary | ICD-10-CM | POA: Diagnosis not present

## 2023-04-07 DIAGNOSIS — I10 Essential (primary) hypertension: Secondary | ICD-10-CM | POA: Diagnosis not present

## 2023-04-07 MED ORDER — PRAVASTATIN SODIUM 20 MG PO TABS: 20.0000 mg | ORAL_TABLET | Freq: Every day | ORAL | 3 refills | Status: DC

## 2023-04-07 MED ORDER — DAPAGLIFLOZIN PROPANEDIOL 10 MG PO TABS: 10.0000 mg | ORAL_TABLET | Freq: Every day | ORAL | 3 refills | Status: AC

## 2023-04-07 MED ORDER — LOSARTAN POTASSIUM 100 MG PO TABS: 100.0000 mg | ORAL_TABLET | Freq: Every day | ORAL | 3 refills | Status: DC

## 2023-04-07 MED ORDER — HYDROCHLOROTHIAZIDE 25 MG PO TABS: 25.0000 mg | ORAL_TABLET | Freq: Every day | ORAL | 3 refills | Status: DC

## 2023-04-07 MED ORDER — METFORMIN HCL 1000 MG PO TABS: 1000.0000 mg | ORAL_TABLET | Freq: Two times a day (BID) | ORAL | 3 refills | Status: DC

## 2023-04-07 MED ORDER — PIOGLITAZONE HCL 30 MG PO TABS: 30.0000 mg | ORAL_TABLET | Freq: Every day | ORAL | 3 refills | Status: AC

## 2023-04-07 NOTE — Progress Notes (Signed)
Subjective:    Patient ID: Gerald [name], male    DOB: [birth year], 67 y.o.   MRN: [id number] Hemoglobin A1c is elevated at 7.8.  The patient is currently on 40 units of Lantus daily.  He reports his fasting blood sugar in the morning is 120-130.  He denies hypoglycemic episodes.  He is here today to discuss further management strategies. Past Medical History:  Diagnosis Date   Abnormal LFTs (liver function tests) 12/2012   Allergy    Anal condyloma    Anemia 07/2010   Diabetes mellitus    Gastric ulcer 07/2010   bleeding GU, clipped and injected with epi. non-bleeding ulcer at [facility]. biopsy negative for H pylori.  on follow up EGD 08/2010, ulcers totally heled   GERD with stricture    Hyperlipemia    Unspecified essential hypertension    Upper GI bleed    Past Surgical History:  Procedure Laterality Date   COLONOSCOPY  10/2010, 04/09/2017   sessile polyp (path: tubular adenoma) in tranverse colon. Dr Arlyce Dice   ESOPHAGOGASTRODUODENOSCOPY  07/24/2010   for hematemesis.  bleeding GU with VV, injected with epi and endoclipped.  second, non-bleeding ulcer at [employer]. biopsies negative for h pylori or dysplasia   ESOPHAGOGASTRODUODENOSCOPY  08/2010   to assess the gastric ulcer.  study was normal.  no residual ulcers.  Dr Arlyce Dice   ESOPHAGOGASTRODUODENOSCOPY N/A 07/10/2017   Procedure: ESOPHAGOGASTRODUODENOSCOPY (EGD);  Surgeon: Beverley Fiedler, MD;  Location: [facility];  Service: Gastroenterology;  Laterality: N/A;   ESOPHAGOGASTRODUODENOSCOPY (EGD) WITH PROPOFOL N/A 03/08/2016   Procedure: ESOPHAGOGASTRODUODENOSCOPY (EGD) WITH PROPOFOL;  Surgeon: Beverley Fiedler, MD;  Location: [facility];  Service: Gastroenterology;  Laterality: N/A;   GANGLION CYST EXCISION     POLYPECTOMY     UPPER GASTROINTESTINAL ENDOSCOPY     Current Outpatient Medications on File Prior to Visit  Medication Sig Dispense Refill   aspirin EC 81 MG tablet Take 81 mg by mouth at bedtime.     Continuous Blood Gluc  Receiver (DEXCOM G7 RECEIVER) DEVI Use to continuously monitor blood sugars daily. 1 each 1   Continuous Blood Gluc Sensor (DEXCOM G7 SENSOR) MISC Use to continuously monitor blood sugars daily. Change sensor every 10 days. 4 each 6   Continuous Blood Gluc Sensor (DEXCOM G7 SENSOR) MISC Use to continuously monitor blood sugars daily. Change sensor every 10 days. 4 each 6   Insulin Glargine (BASAGLAR KWIKPEN) 100 UNIT/ML Inject 40 Units into the skin daily. 15 mL 1   Insulin Pen Needle (BD PEN NEEDLE NANO 2ND GEN) 32G X 4 MM MISC USE AS DIRECTED TO INJECT INSULIN UNDER THE SKIN ONCE DAILY 100 each 3   Lancets (ONETOUCH ULTRASOFT) lancets USE TO CHECK BLOOD SUGAR 3 TIMES DAILY.ICD10:R73.01 100 each 12   levocetirizine (XYZAL) 5 MG tablet Take 1 tablet (5 mg total) by mouth every evening. 90 tablet 0   meclizine (ANTIVERT) 25 MG tablet Take 1 tablet (25 mg total) by mouth 3 (three) times daily as needed for dizziness. 30 tablet 0   Misc Natural Products (TRIPLE FLEX) CAPS Take 1 capsule by mouth.     montelukast (SINGULAIR) 10 MG tablet TAKE 1 TABLET BY MOUTH EVERYDAY AT BEDTIME 90 tablet 3   Multiple Vitamin (MULTIVITAMIN PO) Take by mouth.     naproxen sodium (ANAPROX) 220 MG tablet Take 440 mg by mouth 2 (two) times daily with a meal.     omeprazole (PRILOSEC) 40 MG capsule TAKE 1  CAPSULE (40 MG TOTAL) BY MOUTH DAILY. 90 capsule 3   Glucose Blood (BLOOD GLUCOSE TEST STRIPS 333) STRP Use daily to check blood sugars 4 times per day. 100 strip 3   losartan (COZAAR) 100 MG tablet Take 1 tablet by mouth once daily 90 tablet 0   pravastatin (PRAVACHOL) 20 MG tablet Take 1 tablet by mouth once daily 90 tablet 0   sildenafil (VIAGRA) 100 MG tablet TAKE ONE-HALF (1/2) TO ONE TABLET DAILY AS NEEDED FOR ERECTILE DYSFUNCTION (Patient not taking: Reported on 04/07/2023) 30 tablet 3   No current facility-administered medications on file prior to visit.   No Known Allergies Social History   Socioeconomic  History   Marital status: Married    Spouse name: Not on file   Number of children: Not on file   Years of education: Not on file   Highest education level: GED or equivalent  Occupational History   Occupation: Psychologist, occupational     Employer: [employer]  Tobacco Use   Smoking status: Former   Smokeless tobacco: Former    Types: Engineer, drilling   Vaping status: Never Used  Substance and Sexual Activity   Alcohol use: Yes    Alcohol/week: 2.0 standard drinks of alcohol    Types: 2 Standard drinks or equivalent per week   Drug use: No   Sexual activity: Not on file  Other Topics Concern   Not on file  Social History Narrative   Not on file   Social Drivers of Health   Financial Resource Strain: Low Risk  (12/26/2022)   Overall Financial Resource Strain (CARDIA)    Difficulty of Paying Living Expenses: Not hard at all  Food Insecurity: No Food Insecurity (12/26/2022)   Hunger Vital Sign    Worried About Running Out of Food in the Last Year: Never true    Ran Out of Food in the Last Year: Never true  Transportation Needs: No Transportation Needs (12/26/2022)   PRAPARE - Administrator, Civil Service (Medical): No    Lack of Transportation (Non-Medical): No  Physical Activity: Insufficiently Active (12/26/2022)   Exercise Vital Sign    Days of Exercise per Week: 2 days    Minutes of Exercise per Session: 20 min  Stress: No Stress Concern Present (12/26/2022)   [employer] of Occupational Health - Occupational Stress Questionnaire    Feeling of Stress : Not at all  Social Connections: Unknown (12/26/2022)   Social Connection and Isolation Panel [NHANES]    Frequency of Communication with Friends and Family: More than three times a week    Frequency of Social Gatherings with Friends and Family: Once a week    Attends Religious Services: Patient declined    Database administrator or Organizations: No    Attends Engineer, structural: Not on file    Marital Status:  Married  Catering manager Violence: Not on file   Family History  Problem Relation Age of Onset   Diabetes Brother    Stomach cancer Mother    Colon cancer Neg Hx    Colon polyps Neg Hx    Esophageal cancer Neg Hx    Rectal cancer Neg Hx       Review of Systems  Neurological:  Positive for dizziness.  All other systems reviewed and are negative.      Objective:   Physical Exam Vitals reviewed.  Constitutional:      General: He is not in acute distress.  Appearance: He is well-developed. He is not diaphoretic.  HENT:     Head: Normocephalic and atraumatic.     Right Ear: Tympanic membrane and external ear normal.     Left Ear: Tympanic membrane and external ear normal.     Nose: Nose normal.     Mouth/Throat:     Pharynx: No oropharyngeal exudate.  Eyes:     General: No scleral icterus.       Right eye: No discharge.        Left eye: No discharge.     Conjunctiva/sclera: Conjunctivae normal.     Pupils: Pupils are equal, round, and reactive to light.  Neck:     Thyroid: No thyromegaly.     Vascular: No JVD.     Trachea: No tracheal deviation.  Cardiovascular:     Rate and Rhythm: Normal rate and regular rhythm.     Heart sounds: Normal heart sounds. No murmur heard.    No friction rub. No gallop.  Pulmonary:     Effort: Pulmonary effort is normal. No respiratory distress.     Breath sounds: Normal breath sounds. No stridor. No wheezing or rales.  Chest:     Chest wall: No tenderness.  Musculoskeletal:     Cervical back: Normal range of motion and neck supple.  Lymphadenopathy:     Cervical: No cervical adenopathy.  Skin:    General: Skin is warm.  Neurological:     General: No focal deficit present.     Mental Status: He is alert and oriented to person, place, and time. Mental status is at baseline.     Cranial Nerves: No cranial nerve deficit.     Motor: No weakness or abnormal muscle tone.     Coordination: Coordination normal.     Gait: Gait  normal.     Deep Tendon Reflexes: Reflexes are normal and symmetric.        Assessment & Plan:  Controlled type 2 diabetes mellitus without complication, without long-term current use of insulin (HCC)  Essential hypertension - Plan: hydrochlorothiazide (HYDRODIURIL) 25 MG tablet Recommended uptitrating insulin slowly 2 units to 42 units daily.  Monitor fasting blood sugars.  Continue to do this process every 3 to 4 days until fasting blood sugars approaching 100.  Discontinue this process if fasting blood sugars drop below 80.  Hopefully this will help bring his A1c more in line.  Once his fasting blood sugars approached 100, check 2-hour postprandial sugars.  If greater than 180 he will need to add mealtime insulin.  Also recommended reduction in his carbohydrate intake.  I anticipate the patient will need 45 to 48 units of Lantus daily

## 2023-05-23 ENCOUNTER — Other Ambulatory Visit: Payer: Self-pay | Admitting: Family Medicine

## 2023-06-16 ENCOUNTER — Other Ambulatory Visit: Payer: Self-pay | Admitting: Family Medicine

## 2023-06-17 ENCOUNTER — Other Ambulatory Visit: Payer: Self-pay | Admitting: Family Medicine

## 2023-06-18 ENCOUNTER — Telehealth: Payer: Self-pay

## 2023-06-18 NOTE — Telephone Encounter (Signed)
 Prescription Request  06/18/2023  LOV: 04/07/23  What is the name of the medication or equipment? montelukast (SINGULAIR) 10 MG tablet [[id number]]   Have you contacted your pharmacy to request a refill? Yes   Which pharmacy would you like this sent to?  [id number] [place], [place] - [address] [place] [address] Phone: [phone] Fax: [phone]    Patient notified that their request is being sent to the clinical staff for review and that they should receive a response within 2 business days.   Please advise at [facility] [phone]'

## 2023-07-14 ENCOUNTER — Other Ambulatory Visit: Payer: Self-pay | Admitting: Family Medicine

## 2023-07-14 DIAGNOSIS — E119 Type 2 diabetes mellitus without complications: Secondary | ICD-10-CM

## 2023-07-15 NOTE — Telephone Encounter (Signed)
 Last OV 04/07/23 Requested Prescriptions  Pending Prescriptions Disp Refills   Continuous Glucose Sensor (DEXCOM G7 SENSOR) MISC [Pharmacy Med Name: DEXCOM G7 SENSOR    MIS] 4 each 0    Sig: USE TO CONTINUOUSLY MONITOR BLOOD SUGARS DAILY. CHANGE SENSOR EVERY 10 DAYS     Endocrinology: Diabetes - Testing Supplies Failed - 07/15/2023  3:57 PM      Failed - Valid encounter within last 12 months    Recent Outpatient Visits           1 year ago Subacromial bursitis of left shoulder joint   [facility] Pickard, Priscille Heidelberg, MD   1 year ago Vertigo   [facility] Donita Brooks, MD   2 years ago Acute cough   [facility] Cathlean Marseilles A, NP   2 years ago Controlled type 2 diabetes mellitus without complication, without long-term current use of insulin (HCC)   [facility] Donita Brooks, MD   3 years ago Controlled type 2 diabetes mellitus without complication, without long-term current use of insulin (HCC)   [facility] Pickard, Priscille Heidelberg, MD

## 2023-07-30 ENCOUNTER — Other Ambulatory Visit: Payer: Self-pay | Admitting: Family Medicine

## 2023-07-31 ENCOUNTER — Other Ambulatory Visit: Payer: Self-pay

## 2023-07-31 NOTE — Telephone Encounter (Addendum)
 Prescription Request  07/31/2023  LOV: 04/07/23  What /is the name of the medication or equipment?montelukast (SINGULAIR) 10 MG tablet [[id number]]   Have you contacted your pharmacy to request a refill? Yes   Which pharmacy would you like this sent to?  [id number] [place], [place] - [address] [place] [address] Phone: [phone] Fax: [phone]    Patient notified that their request is being sent to the clinical staff for review and that they should receive a response within 2 business days.   Please advise at [facility] [phone]  Prescription Request  07/31/2023  LOV: 04/07/23  What is the name of the medication or equipment? levocetirizine (XYZAL) 5 MG tablet [[id number]]   Have you contacted your pharmacy to request a refill? Yes   Which pharmacy would you like this sent to?  [id number] [place], [place] - [address] AT [address] [place] [address] Phone: [phone] Fax: [phone]    Patient notified that their request is being sent to the clinical staff for review and that they should receive a response within 2 business days.   Please advise at [facility] [phone]

## 2023-07-31 NOTE — Telephone Encounter (Signed)
 Requested by patient . Protocol met no future visit at this time.  Requested Prescriptions  Pending Prescriptions Disp Refills   omeprazole (PRILOSEC) 40 MG capsule [Pharmacy Med Name: OMEPRAZOLE DR 40 MG CAPSULE] 90 capsule 0    Sig: TAKE 1 CAPSULE BY MOUTH EVERY DAY     Gastroenterology: Proton Pump Inhibitors Passed - 07/31/2023 12:28 PM      Passed - Valid encounter within last 12 months    Recent Outpatient Visits           3 months ago Controlled type 2 diabetes mellitus without complication, without long-term current use of insulin (HCC)   [place] [facility] Pickard, Priscille Heidelberg, MD   7 months ago Essential hypertension   [place] [facility] Tanya Nones, Priscille Heidelberg, MD   1 year ago Benign paroxysmal positional vertigo, unspecified laterality   [place] [facility] Donita Brooks, MD   1 year ago Prostate cancer screening   [place] [place] [facility] Pickard, Priscille Heidelberg, MD

## 2023-08-01 MED ORDER — MONTELUKAST SODIUM 10 MG PO TABS: ORAL_TABLET | ORAL | 0 refills | Status: DC

## 2023-08-01 MED ORDER — LEVOCETIRIZINE DIHYDROCHLORIDE 5 MG PO TABS: 5.0000 mg | ORAL_TABLET | Freq: Every evening | ORAL | 0 refills | Status: DC

## 2023-08-01 NOTE — Telephone Encounter (Signed)
 Last OV 04/07/23.  Requested Prescriptions  Pending Prescriptions Disp Refills   levocetirizine (XYZAL) 5 MG tablet 90 tablet 0    Sig: Take 1 tablet (5 mg total) by mouth every evening.     Ear, Nose, and Throat:  Antihistamines - levocetirizine dihydrochloride Passed - 08/01/2023  8:22 AM      Passed - Cr in normal range and within 360 days    Creat  Date Value Ref Range Status  04/02/2023 0.87 0.70 - 1.35 mg/dL Final   Creatinine, Urine  Date Value Ref Range Status  04/02/2023 100 20 - 320 mg/dL Final         Passed - eGFR is 10 or above and within 360 days    GFR, Est African American  Date Value Ref Range Status  03/09/2020 107 > OR = 60 mL/min/1.75m2 Final   GFR, Est Non African American  Date Value Ref Range Status  03/09/2020 93 > OR = 60 mL/min/1.89m2 Final   eGFR  Date Value Ref Range Status  04/02/2023 95 > OR = 60 mL/min/1.66m2 Final         Passed - Valid encounter within last 12 months    Recent Outpatient Visits           3 months ago Controlled type 2 diabetes mellitus without complication, without long-term current use of insulin (HCC)   [place] [facility] Pickard, Priscille Heidelberg, MD   7 months ago Essential hypertension   [facility] Tanya Nones, Priscille Heidelberg, MD   1 year ago Benign paroxysmal positional vertigo, unspecified laterality   [place] [facility] Tanya Nones, Priscille Heidelberg, MD   1 year ago Prostate cancer screening   [facility] Pickard, Priscille Heidelberg, MD               montelukast (SINGULAIR) 10 MG tablet 90 tablet 0    Sig: TAKE 1 TABLET BY MOUTH EVERYDAY AT BEDTIME     Pulmonology:  Leukotriene Inhibitors Passed - 08/01/2023  8:22 AM      Passed - Valid encounter within last 12 months    Recent Outpatient Visits           3 months ago Controlled type 2 diabetes mellitus without complication, without long-term current use of insulin (HCC)   [place]  [facility] Pickard, Priscille Heidelberg, MD   7 months ago Essential hypertension   [place] [facility] Tanya Nones, Priscille Heidelberg, MD   1 year ago Benign paroxysmal positional vertigo, unspecified laterality   [place] [facility] Donita Brooks, MD   1 year ago Prostate cancer screening   [place] [facility] Pickard, Priscille Heidelberg, MD

## 2023-09-23 ENCOUNTER — Other Ambulatory Visit: Payer: Self-pay | Admitting: Family Medicine

## 2023-09-23 ENCOUNTER — Other Ambulatory Visit: Payer: Self-pay

## 2023-09-23 NOTE — Telephone Encounter (Addendum)
 Prescription Request  09/23/2023  LOV: 04/07/23  What is the name of the medication or equipment? omeprazole  (PRILOSEC) 40 MG capsule [[id number]]   Have you contacted your pharmacy to request a refill? Yes   Which pharmacy would you like this sent to?  [id number] [place], [place] - [address] [place] [address] Phone: [phone] Fax: [phone]    Patient notified that their request is being sent to the clinical staff for review and that they should receive a response within 2 business days.   Please advise at [facility] [phone]  Prescription Request  09/23/2023  LOV: 04/07/23  What is the name of the medication or equipment? levocetirizine (XYZAL ) 5 MG tablet [[id number]]   Have you contacted your pharmacy to request a refill? Yes   Which pharmacy would you like this sent to?  [id number] [place], [place] - [address] [place] [address] Phone: [phone] Fax: [phone]    Patient notified that their request is being sent to the clinical staff for review and that they should receive a response within 2 business days.   Please advise at [facility] [phone]

## 2023-09-24 NOTE — Telephone Encounter (Signed)
 Too soon for refill.  Requested Prescriptions  Pending Prescriptions Disp Refills   omeprazole  (PRILOSEC) 40 MG capsule 90 capsule 0    Sig: Take by mouth daily.     Gastroenterology: Proton Pump Inhibitors Passed - 09/24/2023  3:42 PM      Passed - Valid encounter within last 12 months    Recent Outpatient Visits           5 months ago Controlled type 2 diabetes mellitus without complication, without long-term current use of insulin  [facility])   [place] [facility] Pickard, Cisco Crest, MD   8 months ago Essential hypertension   [place] [facility] Cheril Cork, Cisco Crest, MD   1 year ago Benign paroxysmal positional vertigo, unspecified laterality   [place] [facility] Austine Lefort, MD   1 year ago Prostate cancer screening   [place] [facility] Austine Lefort, MD               levocetirizine (XYZAL ) 5 MG tablet 90 tablet 0    Sig: Take 1 tablet (5 mg total) by mouth every evening.     Ear, Nose, and Throat:  Antihistamines - levocetirizine dihydrochloride  Passed - 09/24/2023  3:42 PM      Passed - Cr in normal range and within 360 days    Creat  Date Value Ref Range Status  04/02/2023 0.87 0.70 - 1.35 mg/dL Final   Creatinine, Urine  Date Value Ref Range Status  04/02/2023 100 20 - 320 mg/dL Final         Passed - eGFR is 10 or above and within 360 days    GFR, Est African American  Date Value Ref Range Status  03/09/2020 107 > OR = 60 mL/min/1.26m2 Final   GFR, Est Non African American  Date Value Ref Range Status  03/09/2020 93 > OR = 60 mL/min/1.90m2 Final   eGFR  Date Value Ref Range Status  04/02/2023 95 > OR = 60 mL/min/1.20m2 Final         Passed - Valid encounter within last 12 months    Recent Outpatient Visits           5 months ago Controlled type 2 diabetes mellitus without complication, without long-term current use of insulin  [facility])   [place] [facility]  [facility] Pickard, Cisco Crest, MD   8 months ago Essential hypertension   [place] [facility] Cheril Cork, Cisco Crest, MD   1 year ago Benign paroxysmal positional vertigo, unspecified laterality   [place] [facility] Austine Lefort, MD   1 year ago Prostate cancer screening   [place] [facility] Pickard, Cisco Crest, MD

## 2023-09-30 ENCOUNTER — Other Ambulatory Visit: Payer: Self-pay | Admitting: Family Medicine

## 2023-09-30 NOTE — Telephone Encounter (Unsigned)
 Copied from CRM [id number]. Topic: Clinical - Medication Refill >> Sep 30, 2023 10:15 AM Rosamond Comes wrote: Medication: sildenafil  (VIAGRA ) 100 MG tablet  Has the patient contacted their pharmacy? No pharmacy stated Dr Cheril Cork to call pharmacy to refill medication  This is the patient's preferred pharmacy:   [id number] [place], [place] - [address] [place] [address] Phone: [phone] Fax: [phone]  Is this the correct pharmacy for this prescription? Yes If no, delete pharmacy and type the correct one.   Has the prescription been filled recently? No  Is the patient out of the medication? Yes  Has the patient been seen for an appointment in the last year OR does the patient have an upcoming appointment? Yes  Can we respond through MyChart? No  Agent: Please be advised that Rx refills may take up to 3 business days. We ask that you follow-up with your pharmacy.

## 2023-10-01 MED ORDER — SILDENAFIL CITRATE 100 MG PO TABS: ORAL_TABLET | ORAL | 1 refills | Status: DC

## 2023-10-01 NOTE — Telephone Encounter (Signed)
 Requested Prescriptions  Pending Prescriptions Disp Refills   sildenafil  (VIAGRA ) 100 MG tablet 30 tablet 1    Sig: TAKE ONE-HALF (1/2) TO ONE TABLET DAILY AS NEEDED FOR ERECTILE DYSFUNCTION     Urology: Erectile Dysfunction Agents Passed - 10/01/2023 12:41 PM      Passed - AST in normal range and within 360 days    AST  Date Value Ref Range Status  04/02/2023 24 10 - 35 U/L Final         Passed - ALT in normal range and within 360 days    ALT  Date Value Ref Range Status  04/02/2023 18 9 - 46 U/L Final         Passed - Last BP in normal range    BP Readings from Last 1 Encounters:  04/07/23 134/82         Passed - Valid encounter within last 12 months    Recent Outpatient Visits           5 months ago Controlled type 2 diabetes mellitus without complication, without long-term current use of insulin  [facility])   [place] [facility] Pickard, Cisco Crest, MD   9 months ago Essential hypertension   [place] [facility] Cheril Cork, Cisco Crest, MD   1 year ago Benign paroxysmal positional vertigo, unspecified laterality   [place] [facility] Austine Lefort, MD   1 year ago Prostate cancer screening   [place] [facility] Pickard, Cisco Crest, MD

## 2023-10-06 ENCOUNTER — Telehealth: Payer: Self-pay

## 2023-10-06 ENCOUNTER — Other Ambulatory Visit: Payer: Self-pay | Admitting: Family Medicine

## 2023-10-06 ENCOUNTER — Other Ambulatory Visit: Payer: Self-pay

## 2023-10-06 DIAGNOSIS — E119 Type 2 diabetes mellitus without complications: Secondary | ICD-10-CM

## 2023-10-06 MED ORDER — BASAGLAR KWIKPEN 100 UNIT/ML ~~LOC~~ SOPN: 40.0000 [IU] | PEN_INJECTOR | Freq: Every day | SUBCUTANEOUS | 1 refills | Status: DC

## 2023-10-06 NOTE — Telephone Encounter (Signed)
 Prescription Request  10/06/2023  LOV: 04/07/23  What is the name of the medication or equipment? Insulin  Glargine (BASAGLAR  KWIKPEN) 100 UNIT/ML [[id number]]   Have you contacted your pharmacy to request a refill? Yes   Which pharmacy would you like this sent to?  [id number] [place], [place] - [address] [place] [address] Phone: [phone] Fax: [phone]    Patient notified that their request is being sent to the clinical staff for review and that they should receive a response within 2 business days.   Please advise at [facility] [phone]

## 2023-10-28 ENCOUNTER — Other Ambulatory Visit: Payer: Self-pay | Admitting: Family Medicine

## 2023-10-29 ENCOUNTER — Other Ambulatory Visit: Payer: Self-pay

## 2023-10-29 ENCOUNTER — Telehealth: Payer: Self-pay | Admitting: Family Medicine

## 2023-10-29 MED ORDER — LEVOCETIRIZINE DIHYDROCHLORIDE 5 MG PO TABS: 5.0000 mg | ORAL_TABLET | Freq: Every evening | ORAL | 0 refills | Status: AC

## 2023-10-29 MED ORDER — OMEPRAZOLE 40 MG PO CPDR: 40.0000 mg | DELAYED_RELEASE_CAPSULE | Freq: Every day | ORAL | 0 refills | Status: DC

## 2023-10-29 MED ORDER — BASAGLAR KWIKPEN 100 UNIT/ML ~~LOC~~ SOPN: 40.0000 [IU] | PEN_INJECTOR | Freq: Every day | SUBCUTANEOUS | 1 refills | Status: DC

## 2023-10-29 NOTE — Telephone Encounter (Signed)
 Prescription Request  10/29/2023  LOV: 04/07/2023  What is the name of the medication or equipment?   **90 day scripts requested**  Insulin  Glargine (BASAGLAR  KWIKPEN) 100 UNIT/ML   levocetirizine (XYZAL ) 5 MG tablet [[id number]]   omeprazole  (PRILOSEC) 40 MG capsule [[id number]]   Have you contacted your pharmacy to request a refill? Yes   Which pharmacy would you like this sent to?  [id number] [place], [place] - [address] [facility] [address] [place] [address] Phone: [phone] Fax: [phone]    Patient notified that their request is being sent to the clinical staff for review and that they should receive a response within 2 business days.   Please advise pharmacist.

## 2023-10-29 NOTE — Telephone Encounter (Signed)
 Sent in medication

## 2023-11-26 ENCOUNTER — Other Ambulatory Visit: Payer: Self-pay

## 2023-11-26 ENCOUNTER — Other Ambulatory Visit: Payer: Self-pay | Admitting: Family Medicine

## 2023-11-26 MED ORDER — SILDENAFIL CITRATE 100 MG PO TABS: ORAL_TABLET | ORAL | 0 refills | Status: DC

## 2023-11-26 NOTE — Telephone Encounter (Signed)
 Sent to provider for review / refill pt. Has been scheduled for medication renewal

## 2023-12-05 ENCOUNTER — Ambulatory Visit: Admitting: Family Medicine

## 2023-12-15 ENCOUNTER — Ambulatory Visit (INDEPENDENT_AMBULATORY_CARE_PROVIDER_SITE_OTHER): Admitting: Family Medicine

## 2023-12-15 ENCOUNTER — Encounter: Payer: Self-pay | Admitting: Family Medicine

## 2023-12-15 DIAGNOSIS — E119 Type 2 diabetes mellitus without complications: Secondary | ICD-10-CM

## 2023-12-15 DIAGNOSIS — Z7984 Long term (current) use of oral hypoglycemic drugs: Secondary | ICD-10-CM | POA: Diagnosis not present

## 2023-12-15 NOTE — Progress Notes (Signed)
 Subjective:    Patient ID: Gerald [name], male    DOB: 1955/08/23, 68 y.o.   MRN: [id number] Patient is here today for a follow-up of his diabetes.  His last diabetic eye exam was 1 year ago.  Diabetic foot exam was performed today and is normal.  Blood pressure is acceptable at 140/72.  He is currently on insulin , Farxiga , Actos , and metformin .  States that his fasting blood sugar is around 120.  His 2-hour postprandial sugars are occasionally up to 180.  He denies any polyuria or polydipsia.  He denies any pain in his feet. Past Medical History:  Diagnosis Date  . Abnormal LFTs (liver function tests) 12/2012  . Allergy   . Anal condyloma   . Anemia 07/2010  . Diabetes mellitus   . Gastric ulcer 07/2010   bleeding GU, clipped and injected with epi. non-bleeding ulcer at [facility]. biopsy negative for H pylori.  on follow up EGD 08/2010, ulcers totally heled  . GERD with stricture   . Hyperlipemia   . Unspecified essential hypertension   . Upper GI bleed    Past Surgical History:  Procedure Laterality Date  . COLONOSCOPY  10/2010, 04/09/2017   sessile polyp (path: tubular adenoma) in tranverse colon. Dr Debrah  . ESOPHAGOGASTRODUODENOSCOPY  07/24/2010   for hematemesis.  bleeding GU with VV, injected with epi and endoclipped.  second, non-bleeding ulcer at [employer]. biopsies negative for h pylori or dysplasia  . ESOPHAGOGASTRODUODENOSCOPY  08/2010   to assess the gastric ulcer.  study was normal.  no residual ulcers.  Dr Debrah  . ESOPHAGOGASTRODUODENOSCOPY N/A 07/10/2017   Procedure: ESOPHAGOGASTRODUODENOSCOPY (EGD);  Surgeon: Albertus Gordy HERO, MD;  Location: [facility];  Service: Gastroenterology;  Laterality: N/A;  . ESOPHAGOGASTRODUODENOSCOPY (EGD) WITH PROPOFOL  N/A 03/08/2016   Procedure: ESOPHAGOGASTRODUODENOSCOPY (EGD) WITH PROPOFOL ;  Surgeon: Gordy HERO Albertus, MD;  Location: [facility];  Service: Gastroenterology;  Laterality: N/A;  . GANGLION CYST EXCISION    . POLYPECTOMY    .  UPPER GASTROINTESTINAL ENDOSCOPY     Current Outpatient Medications on File Prior to Visit  Medication Sig Dispense Refill  . aspirin EC 81 MG tablet Take 81 mg by mouth at bedtime.    . Continuous Blood Gluc Receiver (DEXCOM G7 RECEIVER) DEVI Use to continuously monitor blood sugars daily. 1 each 1  . Continuous Blood Gluc Sensor (DEXCOM G7 SENSOR) MISC Use to continuously monitor blood sugars daily. Change sensor every 10 days. 4 each 6  . Continuous Glucose Sensor (DEXCOM G7 SENSOR) MISC USE TO CONTINUOUSLY MONITOR BLOOD SUGARS DAILY. CHANGE SENSOR EVERY 10 DAYS 9 each 0  . dapagliflozin  propanediol (FARXIGA ) 10 MG TABS tablet Take 1 tablet (10 mg total) by mouth daily. 90 tablet 3  . hydrochlorothiazide  (HYDRODIURIL ) 25 MG tablet Take 1 tablet (25 mg total) by mouth daily. 90 tablet 3  . Insulin  Glargine (BASAGLAR  KWIKPEN) 100 UNIT/ML Inject 40 Units into the skin daily. 45 mL 1  . Insulin  Pen Needle (BD PEN NEEDLE NANO 2ND GEN) 32G X 4 MM MISC USE AS DIRECTED TO INJECT INSULIN  UNDER THE SKIN ONCE DAILY 100 each 3  . Lancets (ONETOUCH ULTRASOFT) lancets USE TO CHECK BLOOD SUGAR 3 TIMES DAILY.ICD10:R73.01 100 each 12  . levocetirizine (XYZAL ) 5 MG tablet Take 1 tablet (5 mg total) by mouth every evening. 90 tablet 0  . losartan  (COZAAR ) 100 MG tablet Take 1 tablet by mouth once daily 90 tablet 0  . losartan  (COZAAR ) 100 MG tablet  Take 1 tablet (100 mg total) by mouth daily. 90 tablet 3  . meclizine  (ANTIVERT ) 25 MG tablet Take 1 tablet (25 mg total) by mouth 3 (three) times daily as needed for dizziness. 30 tablet 0  . metFORMIN  (GLUCOPHAGE ) 1000 MG tablet Take 1 tablet (1,000 mg total) by mouth 2 (two) times daily. 180 tablet 3  . Misc Natural Products (TRIPLE FLEX) CAPS Take 1 capsule by mouth.    . montelukast  (SINGULAIR ) 10 MG tablet TAKE 1 TABLET BY MOUTH EVERYDAY AT BEDTIME 90 tablet 0  . Multiple Vitamin (MULTIVITAMIN PO) Take by mouth.    . naproxen sodium (ANAPROX) 220 MG tablet Take  440 mg by mouth 2 (two) times daily with a meal.    . omeprazole  (PRILOSEC) 40 MG capsule Take 1 capsule (40 mg total) by mouth daily. 90 capsule 0  . pioglitazone  (ACTOS ) 30 MG tablet Take 1 tablet (30 mg total) by mouth daily. Quit glipizide  90 tablet 3  . pravastatin  (PRAVACHOL ) 20 MG tablet Take 1 tablet by mouth once daily 90 tablet 0  . pravastatin  (PRAVACHOL ) 20 MG tablet Take 1 tablet (20 mg total) by mouth daily. 90 tablet 3  . sildenafil  (VIAGRA ) 100 MG tablet TAKE ONE-HALF (1/2) TO ONE TABLET DAILY AS NEEDED FOR ERECTILE DYSFUNCTION 30 tablet 0   No current facility-administered medications on file prior to visit.   No Known Allergies Social History   Socioeconomic History  . Marital status: Married    Spouse name: Not on file  . Number of children: Not on file  . Years of education: Not on file  . Highest education level: 12th grade  Occupational History  . Occupation: Engineer, manufacturing: [employer]  Tobacco Use  . Smoking status: Former  . Smokeless tobacco: Former    Types: Engineer, drilling  . Vaping status: Never Used  Substance and Sexual Activity  . Alcohol use: Yes    Alcohol/week: 2.0 standard drinks of alcohol    Types: 2 Standard drinks or equivalent per week  . Drug use: No  . Sexual activity: Not on file  Other Topics Concern  . Not on file  Social History Narrative  . Not on file   Social Drivers of Health   Financial Resource Strain: Low Risk  (12/11/2023)   Overall Financial Resource Strain (CARDIA)   . Difficulty of Paying Living Expenses: Not hard at all  Food Insecurity: No Food Insecurity (12/11/2023)   Hunger Vital Sign   . Worried About Programme researcher, broadcasting/film/video in the Last Year: Never true   . Ran Out of Food in the Last Year: Never true  Transportation Needs: No Transportation Needs (12/11/2023)   PRAPARE - Transportation   . Lack of Transportation (Medical): No   . Lack of Transportation (Non-Medical): No  Physical Activity: Inactive  (12/11/2023)   Exercise Vital Sign   . Days of Exercise per Week: 2 days   . Minutes of Exercise per Session: 0 min  Stress: No Stress Concern Present (12/11/2023)   [employer] of Occupational Health - Occupational Stress Questionnaire   . Feeling of Stress: Not at all  Social Connections: Moderately Integrated (12/11/2023)   Social Connection and Isolation Panel   . Frequency of Communication with Friends and Family: More than three times a week   . Frequency of Social Gatherings with Friends and Family: Once a week   . Attends Religious Services: 1 to 4 times per year   .  Active Member of Clubs or Organizations: No   . Attends Banker Meetings: Not on file   . Marital Status: Married  Catering manager Violence: Not on file   Family History  Problem Relation Age of Onset  . Diabetes Brother   . Stomach cancer Mother   . Colon cancer Neg Hx   . Colon polyps Neg Hx   . Esophageal cancer Neg Hx   . Rectal cancer Neg Hx       Review of Systems  All other systems reviewed and are negative.      Objective:   Physical Exam Vitals reviewed.  Constitutional:      General: He is not in acute distress.    Appearance: He is well-developed. He is not diaphoretic.  HENT:     Head: Normocephalic and atraumatic.     Right Ear: Tympanic membrane and external ear normal.     Left Ear: Tympanic membrane and external ear normal.     Nose: Nose normal.     Mouth/Throat:     Pharynx: No oropharyngeal exudate.  Eyes:     General: No scleral icterus.       Right eye: No discharge.        Left eye: No discharge.     Conjunctiva/sclera: Conjunctivae normal.     Pupils: Pupils are equal, round, and reactive to light.  Neck:     Thyroid: No thyromegaly.     Vascular: No JVD.     Trachea: No tracheal deviation.  Cardiovascular:     Rate and Rhythm: Normal rate and regular rhythm.     Heart sounds: Normal heart sounds. No murmur heard.    No friction rub. No gallop.   Pulmonary:     Effort: Pulmonary effort is normal. No respiratory distress.     Breath sounds: Normal breath sounds. No stridor. No wheezing or rales.  Chest:     Chest wall: No tenderness.  Musculoskeletal:     Cervical back: Normal range of motion and neck supple.  Lymphadenopathy:     Cervical: No cervical adenopathy.  Skin:    General: Skin is warm.  Neurological:     General: No focal deficit present.     Mental Status: He is alert and oriented to person, place, and time. Mental status is at baseline.     Cranial Nerves: No cranial nerve deficit.     Motor: No weakness or abnormal muscle tone.     Coordination: Coordination normal.     Gait: Gait normal.     Deep Tendon Reflexes: Reflexes are normal and symmetric.        Assessment & Plan:  Controlled type 2 diabetes mellitus without complication, without long-term current use of insulin  (HCC) - Plan: Urine Albumin/Creatinine with ratio (send out) [LAB689], CBC with Differential/Platelet, Comprehensive metabolic panel with GFR, Lipid panel, Hemoglobin A1c, Protein / Creatinine Ratio, Urine Blood pressures acceptable.  Fasting blood sugars acceptable but 2-hour postprandial sugars being elevated.  I will check his hemoglobin A1c today.  If elevated I would recommend switching Farxiga  to Mounjaro  to try to facilitate weight loss and to help lower his blood sugar.  The other option will be to replace Actos  with Mounjaro  however cost may be an issue for the patient.  Recommended a flu shot when they become available.

## 2023-12-16 ENCOUNTER — Ambulatory Visit: Payer: Self-pay | Admitting: Family Medicine

## 2023-12-16 LAB — HEMOGLOBIN A1C
Hgb A1c MFr Bld: 8.1 % — ABNORMAL HIGH (ref <5.7)
Mean Plasma Glucose: 186 mg/dL
eAG (mmol/L): 10.3 mmol/L

## 2023-12-16 LAB — PROTEIN / CREATININE RATIO, URINE
Creatinine, Urine: 56 mg/dL (ref 20–320)
Protein/Creat Ratio: 143 mg/g{creat} (ref 25–148)
Protein/Creatinine Ratio: 0.143 mg/mg{creat} (ref 0.025–0.148)
Total Protein, Urine: 8 mg/dL (ref 5–25)

## 2023-12-16 LAB — CBC WITH DIFFERENTIAL/PLATELET
Absolute Lymphocytes: 1809 {cells}/uL (ref 850–3900)
Absolute Monocytes: 608 {cells}/uL (ref 200–950)
Basophils Absolute: 150 {cells}/uL (ref 0–200)
Basophils Relative: 1.9 %
Eosinophils Absolute: 166 {cells}/uL (ref 15–500)
Eosinophils Relative: 2.1 %
HCT: 36.8 % — ABNORMAL LOW (ref 38.5–50.0)
Hemoglobin: 11.9 g/dL — ABNORMAL LOW (ref 13.2–17.1)
MCH: 27.1 pg (ref 27.0–33.0)
MCHC: 32.3 g/dL (ref 32.0–36.0)
MCV: 83.8 fL (ref 80.0–100.0)
MPV: 11 fL (ref 7.5–12.5)
Monocytes Relative: 7.7 %
Neutro Abs: 5167 {cells}/uL (ref 1500–7800)
Neutrophils Relative %: 65.4 %
Platelets: 207 Thousand/uL (ref 140–400)
RBC: 4.39 Million/uL (ref 4.20–5.80)
RDW: 14.8 % (ref 11.0–15.0)
Total Lymphocyte: 22.9 %
WBC: 7.9 Thousand/uL (ref 3.8–10.8)

## 2023-12-16 LAB — COMPREHENSIVE METABOLIC PANEL WITH GFR
AG Ratio: 1.2 (calc) (ref 1.0–2.5)
ALT: 20 U/L (ref 9–46)
AST: 25 U/L (ref 10–35)
Albumin: 4.2 g/dL (ref 3.6–5.1)
Alkaline phosphatase (APISO): 47 U/L (ref 35–144)
BUN: 19 mg/dL (ref 7–25)
CO2: 27 mmol/L (ref 20–32)
Calcium: 9.3 mg/dL (ref 8.6–10.3)
Chloride: 100 mmol/L (ref 98–110)
Creat: 0.82 mg/dL (ref 0.70–1.35)
Globulin: 3.5 g/dL (ref 1.9–3.7)
Glucose, Bld: 141 mg/dL — ABNORMAL HIGH (ref 65–99)
Potassium: 4.3 mmol/L (ref 3.5–5.3)
Sodium: 138 mmol/L (ref 135–146)
Total Bilirubin: 0.6 mg/dL (ref 0.2–1.2)
Total Protein: 7.7 g/dL (ref 6.1–8.1)
eGFR: 96 mL/min/1.73m2 (ref >=60)

## 2023-12-16 LAB — LIPID PANEL
Cholesterol: 151 mg/dL (ref <200)
HDL: 35 mg/dL — ABNORMAL LOW (ref >=40)
LDL Cholesterol (Calc): 90 mg/dL
Non-HDL Cholesterol (Calc): 116 mg/dL (ref <130)
Total CHOL/HDL Ratio: 4.3 (calc) (ref <5.0)
Triglycerides: 161 mg/dL — ABNORMAL HIGH (ref <150)

## 2023-12-17 ENCOUNTER — Other Ambulatory Visit: Payer: Self-pay

## 2023-12-17 MED ORDER — TIRZEPATIDE 2.5 MG/0.5ML ~~LOC~~ SOAJ: 2.5000 mg | SUBCUTANEOUS | 1 refills | Status: AC

## 2023-12-18 ENCOUNTER — Other Ambulatory Visit: Payer: Self-pay | Admitting: Family Medicine

## 2023-12-18 ENCOUNTER — Telehealth: Payer: Self-pay | Admitting: Family Medicine

## 2023-12-18 NOTE — Telephone Encounter (Signed)
 Prescription Request  12/18/2023  LOV: 12/15/2023  What is the name of the medication or equipment?   levocetirizine (XYZAL ) 5 MG tablet  **90 day script requested**  Have you contacted your pharmacy to request a refill? Yes   Which pharmacy would you like this sent to?  [id number] [address], [place] - [address] [place] [address] Phone: [phone] Fax: [phone]    Patient notified that their request is being sent to the clinical staff for review and that they should receive a response within 2 business days.   Please advise pharmacist.

## 2023-12-18 NOTE — Telephone Encounter (Signed)
 Refill was requested too soon

## 2023-12-23 ENCOUNTER — Telehealth: Payer: Self-pay | Admitting: Family Medicine

## 2023-12-23 ENCOUNTER — Other Ambulatory Visit: Payer: Self-pay

## 2023-12-23 ENCOUNTER — Other Ambulatory Visit: Payer: Self-pay | Admitting: Family Medicine

## 2023-12-23 ENCOUNTER — Telehealth: Payer: Self-pay

## 2023-12-23 DIAGNOSIS — E7800 Pure hypercholesterolemia, unspecified: Secondary | ICD-10-CM

## 2023-12-23 DIAGNOSIS — E119 Type 2 diabetes mellitus without complications: Secondary | ICD-10-CM

## 2023-12-23 MED ORDER — BASAGLAR KWIKPEN 100 UNIT/ML ~~LOC~~ SOPN
40.0000 [IU] | PEN_INJECTOR | Freq: Every day | SUBCUTANEOUS | 1 refills | Status: DC
Start: 2023-12-23 — End: 2024-03-10

## 2023-12-23 MED ORDER — INSULIN LISPRO 100 UNIT/ML IJ SOLN: 5.0000 [IU] | Freq: Three times a day (TID) | INTRAMUSCULAR | 1 refills | Status: DC

## 2023-12-23 NOTE — Telephone Encounter (Signed)
Refill has been requested too soon.

## 2023-12-23 NOTE — Telephone Encounter (Signed)
 Prescription Request  12/23/2023  LOV: 12/15/2023  What is the name of the medication or equipment? omeprazole  (PRILOSEC) 40 MG capsule   Have you contacted your pharmacy to request a refill? Yes   Which pharmacy would you like this sent to?  [id number] [address], [place] - [address] [address] [facility] [address] [place] [address] Phone: [phone] Fax: [phone]    Patient notified that their request is being sent to the clinical staff for review and that they should receive a response within 2 business days.   Please advise at [facility] [phone]

## 2023-12-23 NOTE — Telephone Encounter (Signed)
 Prescription Request  12/23/2023  LOV: 12/15/2023  What is the name of the medication or equipment? montelukast  (SINGULAIR ) 10 MG tablet   Have you contacted your pharmacy to request a refill? Yes   Which pharmacy would you like this sent to?  [id number] [address], [place] - [address] [facility] [address] [place] [address] Phone: [phone] Fax: [phone]    Patient notified that their request is being sent to the clinical staff for review and that they should receive a response within 2 business days.   Please advise at [facility] [phone]

## 2023-12-23 NOTE — Telephone Encounter (Signed)
 Copied from CRM #[id number]. Topic: Clinical - Prescription Issue >> Dec 23, 2023 12:39 PM Larissa S wrote: Reason for CRM: Patient's spouse states insurance does not cover the mounjaro  and would like for him to continue Basaglar  [facility]   [id number] [address], [place] - [address] [facility] [address] [facility] [address] [place] [address] Phone: [phone] Fax: [phone] Hours: Not open 24 hours

## 2023-12-24 ENCOUNTER — Telehealth: Payer: Self-pay

## 2023-12-24 NOTE — Telephone Encounter (Signed)
 Copied from CRM #[id number]. Topic: Clinical - Prescription Issue >> Dec 23, 2023 12:39 PM Larissa S wrote: Reason for CRM: Patient's spouse states insurance does not cover the mounjaro  and would like for him to continue Basaglar  [facility]   [id number] [address], [place] - [address] [place] [address] Phone: [phone] Fax: [phone] Hours: Not open 24 hours >> Dec 24, 2023  2:25 PM Gustabo D wrote: Pt wants to be on the prescription from before because his insurance doesn't cover the other one. His wife called about this on the 12/23/23. Pt will be out of it tomorrow please switch him back to what he was on previously because he can afford it.

## 2023-12-29 ENCOUNTER — Other Ambulatory Visit: Payer: Self-pay | Admitting: Family Medicine

## 2023-12-29 NOTE — Telephone Encounter (Addendum)
 Prescription Request  12/29/2023  LOV: 12/15/2023  What is the name of the medication or equipment? levocetirizine (XYZAL ) 5 MG tablet and montelukast  (SINGULAIR ) 10 MG tablet and omeprazole  (PRILOSEC) 40 MG capsule   Have you contacted your pharmacy to request a refill? Yes   Which pharmacy would you like this sent to?  [id number] [address], [place] - [address] [facility] [address] [place] [address] Phone: [phone] Fax: [phone]    Patient notified that their request is being sent to the clinical staff for review and that they should receive a response within 2 business days.   Please advise at [facility] [phone]

## 2023-12-30 NOTE — Telephone Encounter (Signed)
 Too soon for refill, LRF 10/29/23 for 90 days.  Requested Prescriptions  Pending Prescriptions Disp Refills   levocetirizine (XYZAL ) 5 MG tablet 90 tablet 0    Sig: Take 1 tablet (5 mg total) by mouth every evening.     Ear, Nose, and Throat:  Antihistamines - levocetirizine dihydrochloride  Passed - 12/30/2023 12:14 PM      Passed - Cr in normal range and within 360 days    Creat  Date Value Ref Range Status  12/15/2023 0.82 0.70 - 1.35 mg/dL Final   Creatinine, Urine  Date Value Ref Range Status  12/15/2023 56 20 - 320 mg/dL Final         Passed - eGFR is 10 or above and within 360 days    GFR, Est African American  Date Value Ref Range Status  03/09/2020 107 > OR = 60 mL/min/1.38m2 Final   GFR, Est Non African American  Date Value Ref Range Status  03/09/2020 93 > OR = 60 mL/min/1.52m2 Final   eGFR  Date Value Ref Range Status  12/15/2023 96 > OR = 60 mL/min/1.21m2 Final         Passed - Valid encounter within last 12 months    Recent Outpatient Visits           2 weeks ago Controlled type 2 diabetes mellitus without complication, without long-term current use of insulin  (HCC)   [place] [facility], Butler DASEN, MD   8 months ago Controlled type 2 diabetes mellitus without complication, without long-term current use of insulin  [facility])   [place] [facility] Pickard, Butler DASEN, MD   1 year ago Essential hypertension   [place] [facility] Duanne, Butler DASEN, MD   1 year ago Benign paroxysmal positional vertigo, unspecified laterality   [place] [facility] Duanne, Butler DASEN, MD   1 year ago Prostate cancer screening   [place] [facility] Du[facility] Butler DASEN, MD               montelukast  (SINGULAIR ) 10 MG tablet 90 tablet 0    Sig: TAKE 1 TABLET BY MOUTH EVERYDAY AT BEDTIME     Pulmonology:  Leukotriene Inhibitors Passed - 12/30/2023 12:14 PM      Passed - Valid  encounter within last 12 months    Recent Outpatient Visits           2 weeks ago Controlled type 2 diabetes mellitus without complication, without long-term current use of insulin  (HCC)   [place] [facility] Pickard, Butler DASEN, MD   8 months ago Controlled type 2 diabetes mellitus without complication, without long-term current use of insulin  [facility])   [place] [facility] Pickard, Butler DASEN, MD   1 year ago Essential hypertension   [place] [facility] Du[facility] Butler DASEN, MD   1 year ago Benign paroxysmal positional vertigo, unspecified laterality   [place] [facility] Duanne, Butler DASEN, MD   1 year ago Prostate cancer screening    [facility] Pick[facility], Butler DASEN, MD               omeprazole  (PRILOSEC) 40 MG capsule 90 capsule 0    Sig: Take 1 capsule (40 mg total) by mouth daily.     Gastroenterology: Proton Pump Inhibitors Passed - 12/30/2023 12:14 PM      Passed - Valid  encounter within last 12 months    Recent Outpatient Visits           2 weeks ago Controlled type 2 diabetes mellitus without complication, without long-term current use of insulin  [facility])   [place] [facility] [place] Butler DASEN, MD   8 months ago Controlled type 2 diabetes mellitus without complication, without long-term current use of insulin  [facility])   [place] [facility] Pickard, Butler DASEN, MD   1 year ago Essential hypertension   [place] [facility] Duanne Butler DASEN, MD   1 year ago Benign paroxysmal positional vertigo, unspecified laterality   [place] [facility] Duanne Butler DASEN, MD   1 year ago Prostate cancer screening   [place] [facility] Pickard, Butler DASEN, MD

## 2024-03-09 ENCOUNTER — Other Ambulatory Visit: Payer: Self-pay | Admitting: Family Medicine

## 2024-03-10 ENCOUNTER — Telehealth: Payer: Self-pay

## 2024-03-10 ENCOUNTER — Other Ambulatory Visit: Payer: Self-pay | Admitting: Family Medicine

## 2024-03-10 DIAGNOSIS — E119 Type 2 diabetes mellitus without complications: Secondary | ICD-10-CM

## 2024-03-10 MED ORDER — LANTUS SOLOSTAR 100 UNIT/ML ~~LOC~~ SOPN: 40.0000 [IU] | PEN_INJECTOR | Freq: Every day | SUBCUTANEOUS | 99 refills | Status: AC

## 2024-03-10 NOTE — Telephone Encounter (Signed)
 Fax received from [place] and they do not cover Basaglar  Kwik Pen. They are recommending Lantus. I have sent in the new order. Thank you.

## 2024-03-11 NOTE — Telephone Encounter (Signed)
 Requested Prescriptions  Pending Prescriptions Disp Refills   pravastatin  (PRAVACHOL ) 20 MG tablet [Pharmacy Med Name: PRAVASTATIN  SODIUM 20 MG TAB] 90 tablet 0    Sig: TAKE 1 TABLET BY MOUTH EVERY DAY     Cardiovascular:  Antilipid - Statins Failed - 03/11/2024  1:48 PM      Failed - Lipid Panel in normal range within the last 12 months    Cholesterol  Date Value Ref Range Status  12/15/2023 151 <200 mg/dL Final   LDL Cholesterol (Calc)  Date Value Ref Range Status  12/15/2023 90 mg/dL (calc) Final    Comment:    Reference range: <100 . Desirable range <100 mg/dL for primary prevention;   <70 mg/dL for patients with CHD or diabetic patients  with > or = 2 CHD risk factors. SABRA LDL-C is now calculated using the Martin-Hopkins  calculation, which is a validated novel method providing  better accuracy than the Friedewald equation in the  estimation of LDL-C.  Gladis APPLETHWAITE et al. SANDREA. 7986;689(80): 2061-2068  (http://education.Quest[employer].com/faq/FAQ164)    HDL  Date Value Ref Range Status  12/15/2023 35 (L) > OR = 40 mg/dL Final   Triglycerides  Date Value Ref Range Status  12/15/2023 161 (H) <150 mg/dL Final         Passed - Patient is not pregnant      Passed - Valid encounter within last 12 months    Recent Outpatient Visits           2 months ago Controlled type 2 diabetes mellitus without complication, without long-term current use of insulin  [facility])   [place] [facility] [place] Butler DASEN, MD   11 months ago Controlled type 2 diabetes mellitus without complication, without long-term current use of insulin  [facility])   [place] [facility] Pickard, Butler DASEN, MD   1 year ago Essential hypertension   [place] [facility] [place] Butler DASEN, MD   1 year ago Benign paroxysmal positional vertigo, unspecified laterality   [place] [facility] [place] Butler DASEN, MD   1 year ago Prostate cancer  screening   [place] [facility] Pickard, Butler DASEN, MD

## 2024-03-12 NOTE — Telephone Encounter (Signed)
 Requested Prescriptions  Pending Prescriptions Disp Refills   losartan  (COZAAR ) 100 MG tablet [Pharmacy Med Name: LOSARTAN  POTASSIUM 100 MG TAB] 90 tablet 3    Sig: TAKE 1 TABLET BY MOUTH EVERY DAY     Cardiovascular:  Angiotensin Receptor Blockers Failed - 03/12/2024 12:01 PM      Failed - Last BP in normal range    BP Readings from Last 1 Encounters:  12/15/23 (!) 140/72         Passed - Cr in normal range and within 180 days    Creat  Date Value Ref Range Status  12/15/2023 0.82 0.70 - 1.35 mg/dL Final   Creatinine, Urine  Date Value Ref Range Status  12/15/2023 56 20 - 320 mg/dL Final         Passed - K in normal range and within 180 days    Potassium  Date Value Ref Range Status  12/15/2023 4.3 3.5 - 5.3 mmol/L Final         Passed - Patient is not pregnant      Passed - Valid encounter within last 6 months    Recent Outpatient Visits           2 months ago Controlled type 2 diabetes mellitus without complication, without long-term current use of insulin  [facility])   [place] [facility] [place] Butler DASEN, MD   11 months ago Controlled type 2 diabetes mellitus without complication, without long-term current use of insulin  [facility])   [place] [facility] Pickard, Butler DASEN, MD   1 year ago Essential hypertension   [place] [facility] [place] Butler DASEN, MD   1 year ago Benign paroxysmal positional vertigo, unspecified laterality   [place] [facility] [place] Butler DASEN, MD   1 year ago Prostate cancer screening   [place] [facility] Pickard, Butler DASEN, MD

## 2024-03-23 ENCOUNTER — Other Ambulatory Visit: Payer: Self-pay | Admitting: Family Medicine

## 2024-03-23 NOTE — Telephone Encounter (Unsigned)
 Copied from CRM #[id number]. Topic: Clinical - Medication Refill >> Mar 23, 2024  9:45 AM Cynthia K wrote: Medication:  omeprazole  (PRILOSEC) 40 MG capsule  Has the patient contacted their pharmacy? Yes (Agent: If no, request that the patient contact the pharmacy for the refill. If patient does not wish to contact the pharmacy document the reason why and proceed with request.) (Agent: If yes, when and what did the pharmacy advise?) Pharmacy needs order to refill  This is the patient's preferred pharmacy:  [id number] [place] [place] [place] - [address] [place] [address] Phone: [phone] Fax: [phone]  Is this the correct pharmacy for this prescription? Yes If no, delete pharmacy and type the correct one.   Has the prescription been filled recently? No  Is the patient out of the medication? No  - He has 4 left  Has the patient been seen for an appointment in the last year OR does the patient have an upcoming appointment? Yes  Can we respond through MyChart? Yes  Agent: Please be advised that Rx refills may take up to 3 business days. We ask that you follow-up with your pharmacy.

## 2024-03-25 ENCOUNTER — Telehealth: Payer: Self-pay

## 2024-03-25 MED ORDER — OMEPRAZOLE 40 MG PO CPDR: 40.0000 mg | DELAYED_RELEASE_CAPSULE | Freq: Every day | ORAL | 1 refills | Status: AC

## 2024-03-25 NOTE — Progress Notes (Signed)
 Patient appearing on report for [employer] - Hypertension Control report due to last documented ambulatory blood pressure of 140/72 on 12/15/2023. Next appointment with PCP is NEEDS SCHEDULED.   Outreached patient to discuss hypertension control and medication management.   Current antihypertensives:  Hydrochlorothiazide  25 mg daily Losartan  100 mg daily  Patient has an automated upper arm home BP machine. The patient had an Engineer, manufacturing come to his house, who took his blood pressure. His reading was 130/78 mmHg. Denies any issues with low or high blood pressure. Will updated vitals to reflect most up-to-date reading.   Current blood pressure readings: 130/78 mmHg Patient denies hypotensive signs and symptoms including dizziness, lightheadedness.  Patient denies hypertensive symptoms including headache, chest pain, shortness of breath.   Assessment/Plan: Currently controlled Reviewed goal blood pressure <130/80 Reviewed appropriate administration of medication regimen Reviewed appropriate home BP monitoring technique (avoid caffeine, smoking, and exercise for 30 minutes before checking, rest for at least 5 minutes before taking BP, sit with feet flat on the floor and back against a hard surface, uncross legs, and rest arm on flat surface) Recommend to continue medication as prescribed. Patient to schedule next visit with PCP.  Woodie Jock, PharmD PGY1 Pharmacy Resident  03/25/2024

## 2024-03-25 NOTE — Telephone Encounter (Signed)
 Requested Prescriptions  Pending Prescriptions Disp Refills   omeprazole  (PRILOSEC) 40 MG capsule 90 capsule 0    Sig: Take 1 capsule (40 mg total) by mouth daily.     Gastroenterology: Proton Pump Inhibitors Passed - 03/25/2024  6:00 PM      Passed - Valid encounter within last 12 months    Recent Outpatient Visits           3 months ago Controlled type 2 diabetes mellitus without complication, without long-term current use of insulin  [facility])   [place] [facility] Duanne Butler DASEN, MD   11 months ago Controlled type 2 diabetes mellitus without complication, without long-term current use of insulin  [facility])   [place] [facility] Pickard, Butler DASEN, MD   1 year ago Essential hypertension   [place] [facility] Duanne Butler DASEN, MD   1 year ago Benign paroxysmal positional vertigo, unspecified laterality   [place] [facility] Duanne Butler DASEN, MD   1 year ago Prostate cancer screening    [facility] Pickard, Butler DASEN, MD

## 2024-03-25 NOTE — Progress Notes (Signed)
 Patient appearing on report for [employer] - Hypertension Control report due to last documented ambulatory blood pressure of 140/72 on 12/15/2023. Next appointment with PCP is NEEDS SCHEDULED.   Outreached patient to discuss hypertension control and medication management. Left voicemail for patient to return my call at their convenience.   Dois Jock, PharmD PGY1 Pharmacy Resident  03/25/2024

## 2024-03-26 ENCOUNTER — Other Ambulatory Visit: Payer: Self-pay | Admitting: Family Medicine

## 2024-03-26 DIAGNOSIS — E119 Type 2 diabetes mellitus without complications: Secondary | ICD-10-CM

## 2024-03-29 NOTE — Telephone Encounter (Signed)
 Requested Prescriptions  Pending Prescriptions Disp Refills   Continuous Glucose Sensor (DEXCOM G7 SENSOR) MISC [Pharmacy Med Name: Dexcom G7 Sensor Miscellaneous] 9 each 2    Sig: USE TO CONTINUOUSLY MONITOR BLOOD SUGARS DAILY. CHANGE SENSOR EVERY 10 DAYS .     Endocrinology: Diabetes - Testing Supplies Passed - 03/29/2024  2:50 PM      Passed - Valid encounter within last 12 months    Recent Outpatient Visits           3 months ago Controlled type 2 diabetes mellitus without complication, without long-term current use of insulin  [facility])   [place] [facility] Medicine Duanne Butler DASEN, MD   11 months ago Controlled type 2 diabetes mellitus without complication, without long-term current use of insulin  [facility])   [place] [facility] Medicine Pickard, Butler DASEN, MD   1 year ago Essential hypertension   [place] [facility] Medicine Duanne Butler DASEN, MD   1 year ago Benign paroxysmal positional vertigo, unspecified laterality   [place] [facility] Medicine Duanne Butler DASEN, MD   1 year ago Prostate cancer screening    [facility] Medicine Pickard, Butler DASEN, MD

## 2024-04-28 ENCOUNTER — Telehealth: Payer: Self-pay | Admitting: Family Medicine

## 2024-04-28 ENCOUNTER — Other Ambulatory Visit: Payer: Self-pay

## 2024-04-28 MED ORDER — BD PEN NEEDLE NANO 2ND GEN 32G X 4 MM MISC: 3 refills | Status: AC

## 2024-04-28 NOTE — Telephone Encounter (Signed)
 Sent in medication

## 2024-04-28 NOTE — Telephone Encounter (Signed)
 Prescription Request  04/28/2024  LOV: 12/15/2023  What is the name of the medication or equipment?   Insulin  Pen Needle (BD PEN NEEDLE NANO 2ND GEN) 32G X 4 MM MISC   Have you contacted your pharmacy to request a refill? Yes   Which pharmacy would you like this sent to?  [id number] [place], [place] - [address] [place] [address] Phone: [phone] Fax: [phone]    Patient notified that their request is being sent to the clinical staff for review and that they should receive a response within 2 business days.   Please advise pharmacy.

## 2024-04-30 NOTE — Progress Notes (Signed)
 Gerald [name]                                          MRN: [id number]   04/30/2024   The [facility]BCI Quality Team Specialist reviewed this patient medical record for the purposes of chart review for care gap closure. The following were reviewed: abstraction for care gap closure-controlling blood pressure.    VBCI Quality Team

## 2024-05-07 ENCOUNTER — Other Ambulatory Visit: Payer: Self-pay | Admitting: Family Medicine

## 2024-05-07 NOTE — Telephone Encounter (Signed)
 Requested medications are due for refill today.  yes  Requested medications are on the active medications list.  yes  Last refill. 04/07/2023 #180 3 rf  Future visit scheduled.   no  Notes to clinic.  Labs are expired.    Requested Prescriptions  Pending Prescriptions Disp Refills   metFORMIN  (GLUCOPHAGE ) 1000 MG tablet 180 tablet 3    Sig: Take 1 tablet (1,000 mg total) by mouth 2 (two) times daily.     Endocrinology:  Diabetes - Biguanides Failed - 05/07/2024  5:17 PM      Failed - HBA1C is between 0 and 7.9 and within 180 days    Hgb A1c MFr Bld  Date Value Ref Range Status  12/15/2023 8.1 (H) <5.7 % Final    Comment:    For someone without known diabetes, a hemoglobin A1c value of 6.5% or greater indicates that they may have  diabetes and this should be confirmed with a follow-up  test. . For someone with known diabetes, a value <7% indicates  that their diabetes is well controlled and a value  greater than or equal to 7% indicates suboptimal  control. A1c targets should be individualized based on  duration of diabetes, age, comorbid conditions, and  other considerations. . Currently, no consensus exists regarding use of hemoglobin A1c for diagnosis of diabetes for children. .          Passed - Cr in normal range and within 360 days    Creat  Date Value Ref Range Status  12/15/2023 0.82 0.70 - 1.35 mg/dL Final   Creatinine, Urine  Date Value Ref Range Status  12/15/2023 56 20 - 320 mg/dL Final         Passed - eGFR in normal range and within 360 days    GFR, Est African American  Date Value Ref Range Status  03/09/2020 107 > OR = 60 mL/min/1.14m2 Final   GFR, Est Non African American  Date Value Ref Range Status  03/09/2020 93 > OR = 60 mL/min/1.32m2 Final   eGFR  Date Value Ref Range Status  12/15/2023 96 > OR = 60 mL/min/1.70m2 Final         Passed - B12 Level in normal range and within 720 days    Vitamin B-12  Date Value Ref Range Status   04/02/2023 558 200 - 1,100 pg/mL Final         Passed - Valid encounter within last 6 months    Recent Outpatient Visits           4 months ago Controlled type 2 diabetes mellitus without complication, without long-term current use of insulin  (HCC)   [place] [facility] Medicine Duanne Butler DASEN, MD   1 year ago Controlled type 2 diabetes mellitus without complication, without long-term current use of insulin  [facility])   [place] [facility] Pickard, Butler DASEN, MD   1 year ago Essential hypertension   [place] [facility] Duanne Butler DASEN, MD   1 year ago Benign paroxysmal positional vertigo, unspecified laterality   [place] [facility] Medicine Duanne Butler DASEN, MD   2 years ago Prostate cancer screening    [facility] Medicine Pickard, Butler DASEN, MD              Passed - CBC within normal limits and completed in the last 12 months    WBC  Date Value Ref Range Status  12/15/2023 7.9  3.8 - 10.8 Thousand/uL Final   RBC  Date Value Ref Range Status  12/15/2023 4.39 4.20 - 5.80 Million/uL Final   Hemoglobin  Date Value Ref Range Status  12/15/2023 11.9 (L) 13.2 - 17.1 g/dL Final   HCT  Date Value Ref Range Status  12/15/2023 36.8 (L) 38.5 - 50.0 % Final   MCHC  Date Value Ref Range Status  12/15/2023 32.3 32.0 - 36.0 g/dL Final    Comment:    For adults, a slight decrease in the calculated MCHC value (in the range of 30 to 32 g/dL) is most likely not clinically significant; however, it should be interpreted with caution in correlation with other red cell parameters and the patient's clinical condition.    [facility]  Date Value Ref Range Status  12/15/2023 27.1 27.0 - 33.0 pg Final   MCV  Date Value Ref Range Status  12/15/2023 83.8 80.0 - 100.0 fL Final   No results found for: PLTCOUNTKUC, LABPLAT, POCPLA RDW  Date Value Ref Range Status  12/15/2023 14.8 11.0 - 15.0 % Final

## 2024-05-07 NOTE — Telephone Encounter (Signed)
 Prescription Request  05/07/2024  LOV: 12/15/2023  What is the name of the medication or equipment?   metFORMIN  (GLUCOPHAGE ) 1000 MG tablet   Have you contacted your pharmacy to request a refill? Yes   Which pharmacy would you like this sent to?  [id number] [place], [place] - [address] [place] [address] Phone: [phone] Fax: [phone]    Patient notified that their request is being sent to the clinical staff for review and that they should receive a response within 2 business days.   Please advise pharmacist.

## 2024-05-10 MED ORDER — METFORMIN HCL 1000 MG PO TABS: 1000.0000 mg | ORAL_TABLET | Freq: Two times a day (BID) | ORAL | 3 refills | Status: AC

## 2024-05-20 ENCOUNTER — Other Ambulatory Visit: Payer: Self-pay | Admitting: Family Medicine

## 2024-05-20 DIAGNOSIS — I10 Essential (primary) hypertension: Secondary | ICD-10-CM

## 2024-05-20 NOTE — Telephone Encounter (Signed)
 Prescription Request  05/20/2024  LOV: 12/15/2023  What is the name of the medication or equipment?   hydrochlorothiazide  (HYDRODIURIL ) 25 MG tablet  **90 day script requested**  Have you contacted your pharmacy to request a refill? Yes   Which pharmacy would you like this sent to?  [id number] [place], [place] - [address] [place] [address] Phone: [phone] Fax: [phone]    Patient notified that their request is being sent to the clinical staff for review and that they should receive a response within 2 business days.   Please advise pharmacist.

## 2024-05-21 MED ORDER — HYDROCHLOROTHIAZIDE 25 MG PO TABS: 25.0000 mg | ORAL_TABLET | Freq: Every day | ORAL | 0 refills | Status: AC

## 2024-05-21 NOTE — Telephone Encounter (Signed)
 Requested Prescriptions  Pending Prescriptions Disp Refills   hydrochlorothiazide  (HYDRODIURIL ) 25 MG tablet 90 tablet 0    Sig: Take 1 tablet (25 mg total) by mouth daily.     Cardiovascular: Diuretics - Thiazide Passed - 05/21/2024  1:45 PM      Passed - Cr in normal range and within 180 days    Creat  Date Value Ref Range Status  12/15/2023 0.82 0.70 - 1.35 mg/dL Final   Creatinine, Urine  Date Value Ref Range Status  12/15/2023 56 20 - 320 mg/dL Final         Passed - K in normal range and within 180 days    Potassium  Date Value Ref Range Status  12/15/2023 4.3 3.5 - 5.3 mmol/L Final         Passed - Na in normal range and within 180 days    Sodium  Date Value Ref Range Status  12/15/2023 138 135 - 146 mmol/L Final         Passed - Last BP in normal range    BP Readings from Last 1 Encounters:  03/25/24 130/78         Passed - Valid encounter within last 6 months    Recent Outpatient Visits           5 months ago Controlled type 2 diabetes mellitus without complication, without long-term current use of insulin  (HCC)   [place] [facility] Medicine Duanne Butler DASEN, MD   1 year ago Controlled type 2 diabetes mellitus without complication, without long-term current use of insulin  [facility])   [place] [facility] Family Medicine Pickard, Butler DASEN, MD   1 year ago Essential hypertension   [place] [facility] Medicine Duanne Butler DASEN, MD   1 year ago Benign paroxysmal positional vertigo, unspecified laterality   [place] [facility] Medicine Duanne Butler DASEN, MD   2 years ago Prostate cancer screening    [facility] Family Medicine Pickard, Butler DASEN, MD

## 2024-05-27 ENCOUNTER — Encounter: Payer: Self-pay | Admitting: *Deleted

## 2024-05-27 NOTE — Progress Notes (Signed)
 Gerald [name]                                          MRN: [id number]   05/27/2024   The VBCI Quality Team Specialist reviewed this patient medical record for the purposes of chart review for care gap closure. The following were reviewed: chart review for care gap closure-diabetic eye exam.    VBCI Quality Team
